# Patient Record
Sex: Female | Born: 1935
Health system: Southern US, Community
[De-identification: ages and names within clinical notes are randomized; demographics above are authoritative.]

## PROBLEM LIST (undated history)

## (undated) DIAGNOSIS — N6019 Diffuse cystic mastopathy of unspecified breast: Secondary | ICD-10-CM

## (undated) DIAGNOSIS — M797 Fibromyalgia: Secondary | ICD-10-CM

## (undated) DIAGNOSIS — G248 Other dystonia: Secondary | ICD-10-CM

## (undated) DIAGNOSIS — I10 Essential (primary) hypertension: Secondary | ICD-10-CM

## (undated) DIAGNOSIS — I4510 Unspecified right bundle-branch block: Secondary | ICD-10-CM

## (undated) DIAGNOSIS — D72819 Decreased white blood cell count, unspecified: Secondary | ICD-10-CM

## (undated) DIAGNOSIS — E538 Deficiency of other specified B group vitamins: Secondary | ICD-10-CM

## (undated) DIAGNOSIS — K219 Gastro-esophageal reflux disease without esophagitis: Secondary | ICD-10-CM

## (undated) DIAGNOSIS — G3185 Corticobasal degeneration: Principal | ICD-10-CM

## (undated) DIAGNOSIS — G3184 Mild cognitive impairment, so stated: Secondary | ICD-10-CM

## (undated) DIAGNOSIS — G5600 Carpal tunnel syndrome, unspecified upper limb: Secondary | ICD-10-CM

## (undated) HISTORY — DX: Essential (primary) hypertension: I10

## (undated) HISTORY — DX: Carpal tunnel syndrome, unspecified upper limb: G56.00

## (undated) HISTORY — PX: TONSILLECTOMY: SUR1361

## (undated) HISTORY — DX: Diffuse cystic mastopathy of unspecified breast: N60.19

## (undated) HISTORY — DX: Unspecified right bundle-branch block: I45.10

## (undated) HISTORY — DX: Decreased white blood cell count, unspecified: D72.819

## (undated) HISTORY — PX: APPENDECTOMY: SHX54

## (undated) HISTORY — DX: Deficiency of other specified B group vitamins: E53.8

## (undated) HISTORY — DX: Fibromyalgia: M79.7

## (undated) HISTORY — DX: Gastro-esophageal reflux disease without esophagitis: K21.9

## (undated) HISTORY — PX: CARDIAC CATHETERIZATION: SHX172

## (undated) HISTORY — DX: Mild cognitive impairment of uncertain or unknown etiology: G31.84

## (undated) HISTORY — DX: Corticobasal degeneration: G31.85

---

## 2000-08-10 ENCOUNTER — Other Ambulatory Visit: Admission: RE | Admit: 2000-08-10 | Discharge: 2000-08-10 | Payer: Self-pay | Admitting: Geriatric Medicine

## 2000-10-19 ENCOUNTER — Ambulatory Visit (HOSPITAL_COMMUNITY): Admission: RE | Admit: 2000-10-19 | Discharge: 2000-10-19 | Payer: Self-pay | Admitting: Geriatric Medicine

## 2000-12-16 ENCOUNTER — Encounter: Admission: RE | Admit: 2000-12-16 | Discharge: 2000-12-16 | Payer: Self-pay | Admitting: Obstetrics and Gynecology

## 2000-12-16 ENCOUNTER — Encounter: Payer: Self-pay | Admitting: Obstetrics and Gynecology

## 2001-01-24 ENCOUNTER — Encounter: Payer: Self-pay | Admitting: Geriatric Medicine

## 2001-01-24 ENCOUNTER — Encounter: Admission: RE | Admit: 2001-01-24 | Discharge: 2001-01-24 | Payer: Self-pay | Admitting: Geriatric Medicine

## 2001-09-15 ENCOUNTER — Other Ambulatory Visit: Admission: RE | Admit: 2001-09-15 | Discharge: 2001-09-15 | Payer: Self-pay | Admitting: Obstetrics and Gynecology

## 2001-09-30 ENCOUNTER — Emergency Department (HOSPITAL_COMMUNITY): Admission: EM | Admit: 2001-09-30 | Discharge: 2001-09-30 | Payer: Self-pay | Admitting: Emergency Medicine

## 2001-09-30 ENCOUNTER — Encounter: Payer: Self-pay | Admitting: Emergency Medicine

## 2001-10-02 ENCOUNTER — Encounter: Admission: RE | Admit: 2001-10-02 | Discharge: 2001-10-02 | Payer: Self-pay | Admitting: Geriatric Medicine

## 2001-10-02 ENCOUNTER — Encounter: Payer: Self-pay | Admitting: Geriatric Medicine

## 2001-10-05 ENCOUNTER — Encounter: Payer: Self-pay | Admitting: Geriatric Medicine

## 2001-10-05 ENCOUNTER — Encounter: Admission: RE | Admit: 2001-10-05 | Discharge: 2001-10-05 | Payer: Self-pay | Admitting: Geriatric Medicine

## 2001-12-18 ENCOUNTER — Encounter: Payer: Self-pay | Admitting: Obstetrics and Gynecology

## 2001-12-18 ENCOUNTER — Encounter: Admission: RE | Admit: 2001-12-18 | Discharge: 2001-12-18 | Payer: Self-pay | Admitting: Obstetrics and Gynecology

## 2002-10-10 ENCOUNTER — Encounter: Payer: Self-pay | Admitting: Obstetrics and Gynecology

## 2002-10-10 ENCOUNTER — Encounter: Admission: RE | Admit: 2002-10-10 | Discharge: 2002-10-10 | Payer: Self-pay | Admitting: Obstetrics and Gynecology

## 2002-10-18 ENCOUNTER — Other Ambulatory Visit: Admission: RE | Admit: 2002-10-18 | Discharge: 2002-10-18 | Payer: Self-pay | Admitting: Obstetrics and Gynecology

## 2003-10-16 ENCOUNTER — Encounter: Admission: RE | Admit: 2003-10-16 | Discharge: 2003-10-16 | Payer: Self-pay | Admitting: Obstetrics and Gynecology

## 2003-11-04 ENCOUNTER — Other Ambulatory Visit: Admission: RE | Admit: 2003-11-04 | Discharge: 2003-11-04 | Payer: Self-pay | Admitting: Physician Assistant

## 2004-10-23 ENCOUNTER — Encounter: Admission: RE | Admit: 2004-10-23 | Discharge: 2004-10-23 | Payer: Self-pay | Admitting: Obstetrics and Gynecology

## 2004-12-07 ENCOUNTER — Other Ambulatory Visit: Admission: RE | Admit: 2004-12-07 | Discharge: 2004-12-07 | Payer: Self-pay | Admitting: Obstetrics and Gynecology

## 2005-02-03 ENCOUNTER — Encounter: Admission: RE | Admit: 2005-02-03 | Discharge: 2005-02-03 | Payer: Self-pay | Admitting: Obstetrics and Gynecology

## 2005-02-05 ENCOUNTER — Encounter: Admission: RE | Admit: 2005-02-05 | Discharge: 2005-02-05 | Payer: Self-pay | Admitting: Obstetrics and Gynecology

## 2005-02-11 ENCOUNTER — Encounter: Admission: RE | Admit: 2005-02-11 | Discharge: 2005-02-11 | Payer: Self-pay | Admitting: Obstetrics and Gynecology

## 2005-02-18 ENCOUNTER — Encounter (INDEPENDENT_AMBULATORY_CARE_PROVIDER_SITE_OTHER): Payer: Self-pay | Admitting: Specialist

## 2005-02-18 ENCOUNTER — Encounter: Admission: RE | Admit: 2005-02-18 | Discharge: 2005-02-18 | Payer: Self-pay | Admitting: Obstetrics and Gynecology

## 2005-10-25 ENCOUNTER — Encounter: Admission: RE | Admit: 2005-10-25 | Discharge: 2005-10-25 | Payer: Self-pay | Admitting: Geriatric Medicine

## 2005-11-11 ENCOUNTER — Encounter: Admission: RE | Admit: 2005-11-11 | Discharge: 2005-11-11 | Payer: Self-pay | Admitting: Obstetrics and Gynecology

## 2005-12-14 ENCOUNTER — Other Ambulatory Visit: Admission: RE | Admit: 2005-12-14 | Discharge: 2005-12-14 | Payer: Self-pay | Admitting: Obstetrics and Gynecology

## 2006-01-03 ENCOUNTER — Ambulatory Visit: Payer: Self-pay | Admitting: Internal Medicine

## 2006-01-27 ENCOUNTER — Encounter (INDEPENDENT_AMBULATORY_CARE_PROVIDER_SITE_OTHER): Payer: Self-pay | Admitting: *Deleted

## 2006-01-27 ENCOUNTER — Ambulatory Visit: Payer: Self-pay | Admitting: Internal Medicine

## 2006-01-31 ENCOUNTER — Ambulatory Visit: Payer: Self-pay | Admitting: Internal Medicine

## 2006-03-10 ENCOUNTER — Ambulatory Visit: Payer: Self-pay | Admitting: Internal Medicine

## 2006-05-18 ENCOUNTER — Encounter: Admission: RE | Admit: 2006-05-18 | Discharge: 2006-05-18 | Payer: Self-pay | Admitting: Obstetrics and Gynecology

## 2006-12-15 ENCOUNTER — Other Ambulatory Visit: Admission: RE | Admit: 2006-12-15 | Discharge: 2006-12-15 | Payer: Self-pay | Admitting: Obstetrics and Gynecology

## 2006-12-29 ENCOUNTER — Encounter: Admission: RE | Admit: 2006-12-29 | Discharge: 2006-12-29 | Payer: Self-pay | Admitting: Obstetrics and Gynecology

## 2007-12-21 ENCOUNTER — Other Ambulatory Visit: Admission: RE | Admit: 2007-12-21 | Discharge: 2007-12-21 | Payer: Self-pay | Admitting: Obstetrics and Gynecology

## 2008-01-01 ENCOUNTER — Encounter: Admission: RE | Admit: 2008-01-01 | Discharge: 2008-01-01 | Payer: Self-pay | Admitting: Obstetrics and Gynecology

## 2008-05-27 ENCOUNTER — Encounter: Admission: RE | Admit: 2008-05-27 | Discharge: 2008-05-27 | Payer: Self-pay | Admitting: Orthopaedic Surgery

## 2009-01-06 ENCOUNTER — Encounter: Admission: RE | Admit: 2009-01-06 | Discharge: 2009-01-06 | Payer: Self-pay | Admitting: Obstetrics and Gynecology

## 2010-01-09 ENCOUNTER — Encounter: Admission: RE | Admit: 2010-01-09 | Discharge: 2010-01-09 | Payer: Self-pay | Admitting: Obstetrics and Gynecology

## 2010-07-19 ENCOUNTER — Encounter: Payer: Self-pay | Admitting: Obstetrics and Gynecology

## 2010-08-10 ENCOUNTER — Other Ambulatory Visit: Payer: Self-pay | Admitting: Dermatology

## 2010-11-13 NOTE — Assessment & Plan Note (Signed)
[facility]                           GASTROENTEROLOGY OFFICE NOTE   RIAH, KEHOE                        MRN:          [id number]  DATE:03/10/2006                            DOB:          1934/04/22    RECENT MEDICAL HISTORY AND PHYSICAL FORM:  In my chart for full details.   ASSESSMENT:  1. Gastroesophageal reflux disease, she had grade B esophagitis, off of      medication.  __________ polyps on EGD January 27, 2006.  Her symptoms are      nearly completely controlled except for rare exacerbations with spicy      foods.  On Zegrid 20 mg each day to every other day.  2. Irritable bowel syndrome with spells of diarrhea to certain foods, like      spicy foods, fatty foods with occasional Lomotil use, under good      control.  3. Abnormal liver lesion seen on breast MRI, 1.6 cm was stable over a six      month time.  She has a followup scheduled for November and knows to      contact the breast center about that or her gynecologist who ever is      arranging that, if she does not hear.   RECOMMENDATIONS/PLAN:  Continue current regimen for this patient.  She is  doing well.  I will see her back as needed.  I can refill her Zegrid or Dr.  Larina Bras can.  She is fortunate in that her insurance plan pays for all of  that.  She is taking it in the middle of the morning which seems to work  well and getting the sodium bicarbonate with immediate release of Omeprazole  and Zegrid, that is fine.  She is reassured at this point.  She does not  need a colonoscopy as she had a normal one in 2004.   I appreciate the opportunity to care for this patient.                                   Iva Boop, MD,FACG   CEG/MedQ  DD:  03/10/2006  DT:  03/10/2006  Job #:  [id number]   cc:   Hal T. Stoneking, M.D.  Artist Pais, M.D.

## 2010-12-07 ENCOUNTER — Other Ambulatory Visit: Payer: Self-pay | Admitting: Geriatric Medicine

## 2010-12-07 DIAGNOSIS — Z1231 Encounter for screening mammogram for malignant neoplasm of breast: Secondary | ICD-10-CM

## 2011-01-11 ENCOUNTER — Ambulatory Visit
Admission: RE | Admit: 2011-01-11 | Discharge: 2011-01-11 | Disposition: A | Discharge status: Home / Self Care | Payer: BC Managed Care – PPO | Source: Ambulatory Visit | Attending: Geriatric Medicine | Admitting: Geriatric Medicine

## 2011-01-11 DIAGNOSIS — Z1231 Encounter for screening mammogram for malignant neoplasm of breast: Secondary | ICD-10-CM

## 2011-03-10 ENCOUNTER — Other Ambulatory Visit: Payer: Self-pay | Admitting: Obstetrics and Gynecology

## 2011-03-10 ENCOUNTER — Other Ambulatory Visit (HOSPITAL_COMMUNITY)
Admission: RE | Admit: 2011-03-10 | Discharge: 2011-03-10 | Disposition: A | Discharge status: Home / Self Care | Payer: Medicare Other | Source: Ambulatory Visit | Attending: Obstetrics and Gynecology | Admitting: Obstetrics and Gynecology

## 2011-03-10 DIAGNOSIS — Z124 Encounter for screening for malignant neoplasm of cervix: Secondary | ICD-10-CM | POA: Insufficient documentation

## 2011-07-12 DIAGNOSIS — I10 Essential (primary) hypertension: Secondary | ICD-10-CM | POA: Diagnosis not present

## 2011-08-23 DIAGNOSIS — M9981 Other biomechanical lesions of cervical region: Secondary | ICD-10-CM | POA: Diagnosis not present

## 2011-08-23 DIAGNOSIS — M545 Low back pain: Secondary | ICD-10-CM | POA: Diagnosis not present

## 2011-08-23 DIAGNOSIS — M542 Cervicalgia: Secondary | ICD-10-CM | POA: Diagnosis not present

## 2011-08-23 DIAGNOSIS — M546 Pain in thoracic spine: Secondary | ICD-10-CM | POA: Diagnosis not present

## 2011-08-23 DIAGNOSIS — M999 Biomechanical lesion, unspecified: Secondary | ICD-10-CM | POA: Diagnosis not present

## 2011-09-01 DIAGNOSIS — Z9181 History of falling: Secondary | ICD-10-CM | POA: Diagnosis not present

## 2011-09-01 DIAGNOSIS — M6281 Muscle weakness (generalized): Secondary | ICD-10-CM | POA: Diagnosis not present

## 2011-09-02 DIAGNOSIS — Z9181 History of falling: Secondary | ICD-10-CM | POA: Diagnosis not present

## 2011-09-02 DIAGNOSIS — M6281 Muscle weakness (generalized): Secondary | ICD-10-CM | POA: Diagnosis not present

## 2011-09-06 DIAGNOSIS — Z9181 History of falling: Secondary | ICD-10-CM | POA: Diagnosis not present

## 2011-09-06 DIAGNOSIS — M6281 Muscle weakness (generalized): Secondary | ICD-10-CM | POA: Diagnosis not present

## 2011-09-08 DIAGNOSIS — Z9181 History of falling: Secondary | ICD-10-CM | POA: Diagnosis not present

## 2011-09-08 DIAGNOSIS — M6281 Muscle weakness (generalized): Secondary | ICD-10-CM | POA: Diagnosis not present

## 2011-09-10 DIAGNOSIS — M6281 Muscle weakness (generalized): Secondary | ICD-10-CM | POA: Diagnosis not present

## 2011-09-10 DIAGNOSIS — Z9181 History of falling: Secondary | ICD-10-CM | POA: Diagnosis not present

## 2011-09-15 DIAGNOSIS — M6281 Muscle weakness (generalized): Secondary | ICD-10-CM | POA: Diagnosis not present

## 2011-09-15 DIAGNOSIS — Z9181 History of falling: Secondary | ICD-10-CM | POA: Diagnosis not present

## 2011-09-17 DIAGNOSIS — Z9181 History of falling: Secondary | ICD-10-CM | POA: Diagnosis not present

## 2011-09-17 DIAGNOSIS — M6281 Muscle weakness (generalized): Secondary | ICD-10-CM | POA: Diagnosis not present

## 2011-09-20 DIAGNOSIS — M6281 Muscle weakness (generalized): Secondary | ICD-10-CM | POA: Diagnosis not present

## 2011-09-20 DIAGNOSIS — Z9181 History of falling: Secondary | ICD-10-CM | POA: Diagnosis not present

## 2011-09-22 DIAGNOSIS — M6281 Muscle weakness (generalized): Secondary | ICD-10-CM | POA: Diagnosis not present

## 2011-09-22 DIAGNOSIS — Z9181 History of falling: Secondary | ICD-10-CM | POA: Diagnosis not present

## 2011-09-24 DIAGNOSIS — M6281 Muscle weakness (generalized): Secondary | ICD-10-CM | POA: Diagnosis not present

## 2011-09-24 DIAGNOSIS — Z9181 History of falling: Secondary | ICD-10-CM | POA: Diagnosis not present

## 2011-09-29 DIAGNOSIS — R279 Unspecified lack of coordination: Secondary | ICD-10-CM | POA: Diagnosis not present

## 2011-09-29 DIAGNOSIS — R609 Edema, unspecified: Secondary | ICD-10-CM | POA: Diagnosis not present

## 2011-10-07 DIAGNOSIS — IMO0002 Reserved for concepts with insufficient information to code with codable children: Secondary | ICD-10-CM | POA: Diagnosis not present

## 2011-10-07 DIAGNOSIS — M999 Biomechanical lesion, unspecified: Secondary | ICD-10-CM | POA: Diagnosis not present

## 2011-10-20 DIAGNOSIS — M545 Low back pain: Secondary | ICD-10-CM | POA: Diagnosis not present

## 2011-10-20 DIAGNOSIS — M999 Biomechanical lesion, unspecified: Secondary | ICD-10-CM | POA: Diagnosis not present

## 2011-10-20 DIAGNOSIS — M9981 Other biomechanical lesions of cervical region: Secondary | ICD-10-CM | POA: Diagnosis not present

## 2011-12-02 DIAGNOSIS — E782 Mixed hyperlipidemia: Secondary | ICD-10-CM | POA: Diagnosis not present

## 2011-12-02 DIAGNOSIS — Z79899 Other long term (current) drug therapy: Secondary | ICD-10-CM | POA: Diagnosis not present

## 2011-12-02 DIAGNOSIS — G245 Blepharospasm: Secondary | ICD-10-CM | POA: Diagnosis not present

## 2011-12-02 DIAGNOSIS — E119 Type 2 diabetes mellitus without complications: Secondary | ICD-10-CM | POA: Diagnosis not present

## 2011-12-02 DIAGNOSIS — I10 Essential (primary) hypertension: Secondary | ICD-10-CM | POA: Diagnosis not present

## 2011-12-09 DIAGNOSIS — M545 Low back pain: Secondary | ICD-10-CM | POA: Diagnosis not present

## 2011-12-09 DIAGNOSIS — M999 Biomechanical lesion, unspecified: Secondary | ICD-10-CM | POA: Diagnosis not present

## 2011-12-09 DIAGNOSIS — M9981 Other biomechanical lesions of cervical region: Secondary | ICD-10-CM | POA: Diagnosis not present

## 2011-12-27 DIAGNOSIS — R0789 Other chest pain: Secondary | ICD-10-CM | POA: Diagnosis not present

## 2011-12-27 DIAGNOSIS — R609 Edema, unspecified: Secondary | ICD-10-CM | POA: Diagnosis not present

## 2011-12-27 DIAGNOSIS — I10 Essential (primary) hypertension: Secondary | ICD-10-CM | POA: Diagnosis not present

## 2011-12-27 DIAGNOSIS — I4510 Unspecified right bundle-branch block: Secondary | ICD-10-CM | POA: Diagnosis not present

## 2011-12-29 DIAGNOSIS — R609 Edema, unspecified: Secondary | ICD-10-CM | POA: Diagnosis not present

## 2011-12-29 DIAGNOSIS — R0789 Other chest pain: Secondary | ICD-10-CM | POA: Diagnosis not present

## 2011-12-29 DIAGNOSIS — M7989 Other specified soft tissue disorders: Secondary | ICD-10-CM | POA: Diagnosis not present

## 2011-12-31 ENCOUNTER — Other Ambulatory Visit: Payer: Self-pay | Admitting: Cardiology

## 2011-12-31 ENCOUNTER — Encounter: Payer: Self-pay | Admitting: Cardiology

## 2011-12-31 NOTE — Addendum Note (Signed)
Addended by: Armanda Magic on: 12/31/2011 10:46 AM   Modules accepted: Orders

## 2011-12-31 NOTE — H&P (Signed)
Office Visit     Patient: Tracy [name], Tracy [name] Provider: Armanda Magic, MD  DOB: [birth year] Age: 78 Y Sex: Female Date: 12/27/2011  Phone: [phone]   Address: [address] [place], [place], [place]  Pcp: HAL STONEKING       Subjective:     CC:    1. CHEST HEAVINESS X 1 WEEK/LIGHTHEADEDNESS.        HPI:  General:  The patient presents today for followup of more chest pain. She had some chest pain last fall and nuclear stress test was normal. She now presents back today with complaints of recurrent chest pain. She thinks some of it is muscular but then she has some problems of feeling like there is an elephant sitting on her chest. She has a history of GERD and is having a flare of the refulx. The chest pressure is all day off and on with no radiation. She has been taking TUMS for the GERD but she is not sure if it helps the pressure in her chest. She denies any SOB, DOE, palpitations or syncope. She has noticed some swelling in her left leg that is worse by the end of the day for the past 3 months and occurs daily. It is all across the ankle. .        ROS:  See HPI, A twelve system review was perfomed at today's visit. For pertinent positives and negatives see HPI.       Medical History: Diabetes mellitus type 2, Fibromyalgia, Fibrocystic breast disease with hyperproliferative ductal cells with focal atypia, Hypertension, GERD, Carpal tunnel syndrome, history abnormal Pap smear, opthalmology-Dr. Charlotte Sanes, cardiology-Dr. Mayford Knife, GI-Dr. Randa Evens, GYN-Dr. Gevena Cotton, orthopedics-Dr. Merlyn Lot, dentistry-Dr. Melvyn Neth, chiropractor-Dr. Melvyn Neth, Right bundle branch block, hx pneumonia and since then often easily gets URIs., Normal exercize cardiolite 03/2011.        Family History: Father: deceased 35 yrs MI Mother: deceased macular degeneration, dementia Maternal aunt: Breast cancer        Social History:  General:  History of smoking  cigarettes: Never smoked no Smoking.  no Alcohol.  no  Recreational drug use.  no Exercise.  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  Marital Status: married.  Children: Boys, 2.  Seat belt use: yes.  2 sons.       Medications: Vitamin D 1000 UNIT Tablet 1 tablet Once a day, Calcium 500-100-40 Tablet Chewable 1 tablet three times daily, PreserVision/Lutein Capsule 1 tablet daily, ZyrTEC 10 MG Tablet 1/2 tablet Once a day, Aspir-81 81 MG Tablet Delayed Release 1 tablet Once a day, Accu-Chek Aviva Test Strips - - as directed once a day, Krill Oil ? Capsule as directed , Hydrochlorothiazide 12.5 MG Capsule TAKE 1 CAPSULE EVERY DAY , Amlodipine Besylate 10 MG Tablet TAKE 1 TABLET EVERY DAY , GlipiZIDE 5 MG Tablet one half tablet twice a day, Metformin HCl 500 MG Tablet TAKE 1 TABLET IN THE MORNING THEN TAKE 1 TABLET AT NOON & TAKE 2 TABLETS IN THE EVENING , Medication List reviewed and reconciled with the patient       Allergies: Codeine (for allergy), Novocain, smoke, perfumes, molds, ACE inhibitor: cough.       Objective:     Vitals: Wt 163.2, Wt change 4.8 lb, Ht 64.5, BMI 27.58, Pulse sitting 84, BP sitting 140/68.       Examination:  Cardiology, General:  GENERAL APPEARANCE: pleasant, NAD.  HEENT: unremarkable.  CAROTID UPSTROKE: normal, no bruit.  JVD: flat.  HEART SOUNDS: regular, normal  S1, S2, no S3 or S4.  MURMUR: absent.  LUNGS: no rales or wheezes.  ABDOMEN: soft, non tender, positive bowel sounds, no masses felt.  EXTREMITIES: no leg edema.  PERIPHERAL PULSES: 2 plus bilateral.        Assessment:     Assessment:  1. Chest heaviness - 786.59 (Primary)  2. Essential hypertension, benign - 401.1, Blood pressure under fair control continue to monitor no change in medication  3. RBBB - 426.4  4. Edema - 782.3    Plan:     1. Chest heaviness  Diagnostic Imaging:EKG NSR, RBBB, Corson,Danielle 12/27/2011 01:36:12 PM > TURNER,TRACI M 12/27/2011 01:44:28 PM >, Cardiac Cath (Ordered  for 01/03/2012)  Since she is having recurrent chest discomfort despite a normal nuclear stress test several months ago I have recommended proceeding the heart catheterization to evaluate coronary arteries. , Risks and benefits of cardiac catheterization have been reviewed including risk of stroke, heart attack, death, bleeding, renal impariment and arterial damage. There was ample oppurtuny to answer questions. Alternatives were discussed. Patient understands and wishes to proceed.       2. Essential hypertension, benign Continue Hydrochlorothiazide Capsule, 12.5 MG, TAKE 1 CAPSULE EVERY DAY ; Continue Amlodipine Besylate Tablet, 10 MG, TAKE 1 TABLET EVERY DAY .       3. Edema  Diagnostic Imaging:Venous Doppler (Ordered for 01/03/2012)  She has isolated LLE edema at her ankle and a history of phlebitis after a car trip in the past so I will get a LE venous doppler to rule out DVT.        Immunizations:        Labs:        Procedure Codes: 19147 EKG I AND R       Preventive:         Follow Up: cardiac cath      Provider: Armanda Magic, MD  Patient: Tracy [name], Tracy [name] DOB: [birth year] Date: 12/27/2011

## 2012-01-04 ENCOUNTER — Encounter (HOSPITAL_BASED_OUTPATIENT_CLINIC_OR_DEPARTMENT_OTHER)
Admission: RE | Disposition: A | Discharge status: Home / Self Care | Payer: Self-pay | Source: Ambulatory Visit | Attending: Cardiology

## 2012-01-04 ENCOUNTER — Inpatient Hospital Stay (HOSPITAL_BASED_OUTPATIENT_CLINIC_OR_DEPARTMENT_OTHER)
Admission: RE | Admit: 2012-01-04 | Discharge: 2012-01-04 | Disposition: A | Discharge status: Home / Self Care | Payer: Medicare Other | Source: Ambulatory Visit | Attending: Cardiology | Admitting: Cardiology

## 2012-01-04 DIAGNOSIS — K219 Gastro-esophageal reflux disease without esophagitis: Secondary | ICD-10-CM | POA: Insufficient documentation

## 2012-01-04 DIAGNOSIS — E119 Type 2 diabetes mellitus without complications: Secondary | ICD-10-CM | POA: Insufficient documentation

## 2012-01-04 DIAGNOSIS — I4510 Unspecified right bundle-branch block: Secondary | ICD-10-CM | POA: Insufficient documentation

## 2012-01-04 DIAGNOSIS — R079 Chest pain, unspecified: Secondary | ICD-10-CM | POA: Diagnosis not present

## 2012-01-04 DIAGNOSIS — R0789 Other chest pain: Secondary | ICD-10-CM | POA: Diagnosis not present

## 2012-01-04 DIAGNOSIS — IMO0001 Reserved for inherently not codable concepts without codable children: Secondary | ICD-10-CM | POA: Insufficient documentation

## 2012-01-04 DIAGNOSIS — I10 Essential (primary) hypertension: Secondary | ICD-10-CM | POA: Diagnosis not present

## 2012-01-04 SURGERY — JV LEFT HEART CATHETERIZATION WITH CORONARY ANGIOGRAM
Anesthesia: Moderate Sedation

## 2012-01-04 MED ORDER — SODIUM CHLORIDE 0.9 % IV SOLN: 250.0000 mL | INTRAVENOUS | Status: DC | PRN

## 2012-01-04 MED ORDER — METFORMIN HCL 500 MG PO TABS: 500.0000 mg | ORAL_TABLET | Freq: Two times a day (BID) | ORAL | Status: DC

## 2012-01-04 MED ORDER — SODIUM CHLORIDE 0.9 % IJ SOLN: 3.0000 mL | INTRAMUSCULAR | Status: DC | PRN

## 2012-01-04 MED ORDER — ASPIRIN 81 MG PO CHEW
324.0000 mg | CHEWABLE_TABLET | ORAL | Status: AC
  Administered 2012-01-04: 324 mg via ORAL

## 2012-01-04 MED ORDER — SODIUM CHLORIDE 0.9 % IV SOLN
INTRAVENOUS | Status: DC
  Administered 2012-01-04: 10:00:00 via INTRAVENOUS

## 2012-01-04 MED ORDER — SODIUM CHLORIDE 0.9 % IV SOLN: 1.0000 mL/kg/h | INTRAVENOUS | Status: DC

## 2012-01-04 MED ORDER — SODIUM CHLORIDE 0.9 % IJ SOLN: 3.0000 mL | Freq: Two times a day (BID) | INTRAMUSCULAR | Status: DC

## 2012-01-04 MED ORDER — DIAZEPAM 5 MG PO TABS
5.0000 mg | ORAL_TABLET | ORAL | Status: AC
  Administered 2012-01-04: 5 mg via ORAL

## 2012-01-04 NOTE — CV Procedure (Signed)
PROCEDURE:  Left heart catheterization with selective coronary angiography, left ventriculogram.  INDICATIONS:    The risks, benefits, and details of the procedure were explained to the patient.  The patient verbalized understanding and wanted to proceed.  Informed written consent was obtained.  PROCEDURE TECHNIQUE:  After Xylocaine anesthesia a 29F sheath was placed in the right femoral artery with a single anterior needle wall stick.   Left coronary angiography was done using a Judkins L4 guide catheter.  Right coronary angiography was done using a Judkins R4 guide catheter.  Left ventriculography was done using a pigtail catheter.    CONTRAST:  Total of 75 cc.  COMPLICATIONS:  None.    HEMODYNAMICS:  Aortic pressure was 133/30mmHg; LV pressure was 134/12mmHg; LVEDP .  There was no gradient between the left ventricle and aorta.    ANGIOGRAPHIC DATA:   The left main coronary artery is widely patent.  The left anterior descending artery is widely patent and gives rise to a large bifurcating diagonal.  The superior and inferior branches of the diagonal then bifurcate again.  All are widely patent.   The left circumflex artery is widely patent.  It gives rise to 2 moderate sized OM branches which are patent.  The right coronary artery is widely patent.  Distally it bifurcates into a PDA and PL branches which are patent.  LEFT VENTRICULOGRAM:  Left ventricular angiogram was done in the 30 RAO projection and revealed normal left ventricular wall motion and systolic function with an estimated ejection fraction of 65%.  LVEDP was 12 mmHg.  IMPRESSIONS:  1. Normal left main coronary artery. 2. Normal left anterior descending artery and its branches. 3. Normal left circumflex artery and its branches. 4. Normal right coronary artery. 5. Normal left ventricular systolic function.  LVEDP 12 mmHg.  Ejection fraction 65%.  RECOMMENDATION:   1.  Discharge home when bedrest and IVF hydration  complete. 2.  Continue current meds 3.  Followup with my NP in 2 weeks for groin check 4.  Followup with primary MD for further workup of noncardiac chest pain

## 2012-01-04 NOTE — Interval H&P Note (Signed)
History and Physical Interval Note:  01/04/2012 11:04 AM  Tracy [name]  has presented today for surgery, with the diagnosis of cp  The various methods of treatment have been discussed with the patient and family. After consideration of risks, benefits and other options for treatment, the patient has consented to  Procedure(s) (LRB): JV LEFT HEART CATHETERIZATION WITH CORONARY ANGIOGRAM (N/A) as a surgical intervention .  The patient's history has been reviewed, patient examined, no change in status, stable for surgery.  I have reviewed the patients' chart and labs.  Questions were answered to the patient's satisfaction.     Loden Laurent R

## 2012-01-04 NOTE — Progress Notes (Signed)
Bedrest begins @ 1110.  Tegaderm dressing applied to right groin site by Venda Rodes.

## 2012-01-04 NOTE — H&P (View-Only) (Signed)
Office Visit     Patient: Edds, Aditi P Provider: Makeisha Jentsch, MD  DOB: 10/24/1933 Age: 78 Y Sex: Female Date: 12/27/2011  Phone: [phone]   Address: [address], [place], [id number]  Pcp: HAL STONEKING       Subjective:     CC:    1. CHEST HEAVINESS X 1 WEEK/LIGHTHEADEDNESS.        HPI:  General:  The patient presents today for followup of more chest pain. She had some chest pain last fall and nuclear stress test was normal. She now presents back today with complaints of recurrent chest pain. She thinks some of it is muscular but then she has some problems of feeling like there is an elephant sitting on her chest. She has a history of GERD and is having a flare of the refulx. The chest pressure is all day off and on with no radiation. She has been taking TUMS for the GERD but she is not sure if it helps the pressure in her chest. She denies any SOB, DOE, palpitations or syncope. She has noticed some swelling in her left leg that is worse by the end of the day for the past 3 months and occurs daily. It is all across the ankle. .        ROS:  See HPI, A twelve system review was perfomed at today's visit. For pertinent positives and negatives see HPI.       Medical History: Diabetes mellitus type 2, Fibromyalgia, Fibrocystic breast disease with hyperproliferative ductal cells with focal atypia, Hypertension, GERD, Carpal tunnel syndrome, history abnormal Pap smear, opthalmology-Dr. Mccuen, cardiology-Dr. Kaedin Hicklin, GI-Dr. Edwards, GYN-Dr. Okasi, orthopedics-Dr. Kuzma, dentistry-Dr. Lewis, chiropractor-Dr. Lewis, Right bundle branch block, hx pneumonia and since then often easily gets URIs., Normal exercize cardiolite 03/2011.        Family History: Father: deceased 51 yrs MI Mother: deceased macular degeneration, dementia Maternal aunt: Breast cancer        Social History:  General:  History of smoking  cigarettes: Never smoked no Smoking.  no Alcohol.  no  Recreational drug use.  no Exercise.  Occupation: unemployed, Retired librarian, Masters degree in library sciences.  Marital Status: married.  Children: Boys, 2.  Seat belt use: yes.  2 sons.       Medications: Vitamin D 1000 UNIT Tablet 1 tablet Once a day, Calcium 500-100-40 Tablet Chewable 1 tablet three times daily, PreserVision/Lutein Capsule 1 tablet daily, ZyrTEC 10 MG Tablet 1/2 tablet Once a day, Aspir-81 81 MG Tablet Delayed Release 1 tablet Once a day, Accu-Chek Aviva Test Strips - - as directed once a day, Krill Oil ? Capsule as directed , Hydrochlorothiazide 12.5 MG Capsule TAKE 1 CAPSULE EVERY DAY , Amlodipine Besylate 10 MG Tablet TAKE 1 TABLET EVERY DAY , GlipiZIDE 5 MG Tablet one half tablet twice a day, Metformin HCl 500 MG Tablet TAKE 1 TABLET IN THE MORNING THEN TAKE 1 TABLET AT NOON & TAKE 2 TABLETS IN THE EVENING , Medication List reviewed and reconciled with the patient       Allergies: Codeine (for allergy), Novocain, smoke, perfumes, molds, ACE inhibitor: cough.       Objective:     Vitals: Wt 163.2, Wt change 4.8 lb, Ht 64.5, BMI 27.58, Pulse sitting 84, BP sitting 140/68.       Examination:  Cardiology, General:  GENERAL APPEARANCE: pleasant, NAD.  HEENT: unremarkable.  CAROTID UPSTROKE: normal, no bruit.  JVD: flat.  HEART SOUNDS: regular, normal   S1, S2, no S3 or S4.  MURMUR: absent.  LUNGS: no rales or wheezes.  ABDOMEN: soft, non tender, positive bowel sounds, no masses felt.  EXTREMITIES: no leg edema.  PERIPHERAL PULSES: 2 plus bilateral.        Assessment:     Assessment:  1. Chest heaviness - 786.59 (Primary)  2. Essential hypertension, benign - 401.1, Blood pressure under fair control continue to monitor no change in medication  3. RBBB - 426.4  4. Edema - 782.3    Plan:     1. Chest heaviness  Diagnostic Imaging:EKG NSR, RBBB, Corson,Danielle 12/27/2011 01:36:12 PM > Israel Werts M 12/27/2011 01:44:28 PM >, Cardiac Cath (Ordered  for 01/03/2012)  Since she is having recurrent chest discomfort despite a normal nuclear stress test several months ago I have recommended proceeding the heart catheterization to evaluate coronary arteries. , Risks and benefits of cardiac catheterization have been reviewed including risk of stroke, heart attack, death, bleeding, renal impariment and arterial damage. There was ample oppurtuny to answer questions. Alternatives were discussed. Patient understands and wishes to proceed.       2. Essential hypertension, benign Continue Hydrochlorothiazide Capsule, 12.5 MG, TAKE 1 CAPSULE EVERY DAY ; Continue Amlodipine Besylate Tablet, 10 MG, TAKE 1 TABLET EVERY DAY .       3. Edema  Diagnostic Imaging:Venous Doppler (Ordered for 01/03/2012)  She has isolated LLE edema at her ankle and a history of phlebitis after a car trip in the past so I will get a LE venous doppler to rule out DVT.        Immunizations:        Labs:        Procedure Codes: 93000 EKG I AND R       Preventive:         Follow Up: cardiac cath      Provider: Sabena Winner, MD  Patient: Galeana, Amara P DOB: 06/01/1934 Date: 12/27/2011    

## 2012-01-14 ENCOUNTER — Other Ambulatory Visit: Payer: Self-pay | Admitting: Obstetrics and Gynecology

## 2012-01-14 DIAGNOSIS — Z1231 Encounter for screening mammogram for malignant neoplasm of breast: Secondary | ICD-10-CM

## 2012-01-18 DIAGNOSIS — R0789 Other chest pain: Secondary | ICD-10-CM | POA: Diagnosis not present

## 2012-01-18 DIAGNOSIS — Z48812 Encounter for surgical aftercare following surgery on the circulatory system: Secondary | ICD-10-CM | POA: Diagnosis not present

## 2012-01-18 DIAGNOSIS — I10 Essential (primary) hypertension: Secondary | ICD-10-CM | POA: Diagnosis not present

## 2012-02-07 ENCOUNTER — Ambulatory Visit
Admission: RE | Admit: 2012-02-07 | Discharge: 2012-02-07 | Disposition: A | Discharge status: Home / Self Care | Payer: Medicare Other | Source: Ambulatory Visit | Attending: Obstetrics and Gynecology | Admitting: Obstetrics and Gynecology

## 2012-02-07 DIAGNOSIS — Z1231 Encounter for screening mammogram for malignant neoplasm of breast: Secondary | ICD-10-CM

## 2012-03-01 DIAGNOSIS — Z23 Encounter for immunization: Secondary | ICD-10-CM | POA: Diagnosis not present

## 2012-03-13 DIAGNOSIS — E049 Nontoxic goiter, unspecified: Secondary | ICD-10-CM | POA: Diagnosis not present

## 2012-03-13 DIAGNOSIS — Z01419 Encounter for gynecological examination (general) (routine) without abnormal findings: Secondary | ICD-10-CM | POA: Diagnosis not present

## 2012-03-15 ENCOUNTER — Other Ambulatory Visit: Payer: Self-pay | Admitting: Obstetrics and Gynecology

## 2012-03-15 DIAGNOSIS — E049 Nontoxic goiter, unspecified: Secondary | ICD-10-CM

## 2012-03-17 ENCOUNTER — Ambulatory Visit
Admission: RE | Admit: 2012-03-17 | Discharge: 2012-03-17 | Disposition: A | Discharge status: Home / Self Care | Payer: Medicare Other | Source: Ambulatory Visit | Attending: Obstetrics and Gynecology | Admitting: Obstetrics and Gynecology

## 2012-03-17 DIAGNOSIS — E042 Nontoxic multinodular goiter: Secondary | ICD-10-CM | POA: Diagnosis not present

## 2012-03-17 DIAGNOSIS — E049 Nontoxic goiter, unspecified: Secondary | ICD-10-CM

## 2012-03-30 DIAGNOSIS — M545 Low back pain: Secondary | ICD-10-CM | POA: Diagnosis not present

## 2012-03-30 DIAGNOSIS — M999 Biomechanical lesion, unspecified: Secondary | ICD-10-CM | POA: Diagnosis not present

## 2012-03-30 DIAGNOSIS — M9981 Other biomechanical lesions of cervical region: Secondary | ICD-10-CM | POA: Diagnosis not present

## 2012-05-24 DIAGNOSIS — M6281 Muscle weakness (generalized): Secondary | ICD-10-CM | POA: Diagnosis not present

## 2012-05-24 DIAGNOSIS — R279 Unspecified lack of coordination: Secondary | ICD-10-CM | POA: Diagnosis not present

## 2012-05-24 DIAGNOSIS — M19049 Primary osteoarthritis, unspecified hand: Secondary | ICD-10-CM | POA: Diagnosis not present

## 2012-05-26 DIAGNOSIS — M19049 Primary osteoarthritis, unspecified hand: Secondary | ICD-10-CM | POA: Diagnosis not present

## 2012-05-26 DIAGNOSIS — R279 Unspecified lack of coordination: Secondary | ICD-10-CM | POA: Diagnosis not present

## 2012-05-26 DIAGNOSIS — M6281 Muscle weakness (generalized): Secondary | ICD-10-CM | POA: Diagnosis not present

## 2012-05-29 DIAGNOSIS — M19049 Primary osteoarthritis, unspecified hand: Secondary | ICD-10-CM | POA: Diagnosis not present

## 2012-05-29 DIAGNOSIS — M6281 Muscle weakness (generalized): Secondary | ICD-10-CM | POA: Diagnosis not present

## 2012-05-29 DIAGNOSIS — R279 Unspecified lack of coordination: Secondary | ICD-10-CM | POA: Diagnosis not present

## 2012-05-31 DIAGNOSIS — M6281 Muscle weakness (generalized): Secondary | ICD-10-CM | POA: Diagnosis not present

## 2012-05-31 DIAGNOSIS — R279 Unspecified lack of coordination: Secondary | ICD-10-CM | POA: Diagnosis not present

## 2012-05-31 DIAGNOSIS — M19049 Primary osteoarthritis, unspecified hand: Secondary | ICD-10-CM | POA: Diagnosis not present

## 2012-06-02 DIAGNOSIS — M19049 Primary osteoarthritis, unspecified hand: Secondary | ICD-10-CM | POA: Diagnosis not present

## 2012-06-02 DIAGNOSIS — M6281 Muscle weakness (generalized): Secondary | ICD-10-CM | POA: Diagnosis not present

## 2012-06-02 DIAGNOSIS — R279 Unspecified lack of coordination: Secondary | ICD-10-CM | POA: Diagnosis not present

## 2012-06-07 DIAGNOSIS — R279 Unspecified lack of coordination: Secondary | ICD-10-CM | POA: Diagnosis not present

## 2012-06-07 DIAGNOSIS — M6281 Muscle weakness (generalized): Secondary | ICD-10-CM | POA: Diagnosis not present

## 2012-06-07 DIAGNOSIS — M19049 Primary osteoarthritis, unspecified hand: Secondary | ICD-10-CM | POA: Diagnosis not present

## 2012-06-08 DIAGNOSIS — I10 Essential (primary) hypertension: Secondary | ICD-10-CM | POA: Diagnosis not present

## 2012-06-08 DIAGNOSIS — E782 Mixed hyperlipidemia: Secondary | ICD-10-CM | POA: Diagnosis not present

## 2012-06-08 DIAGNOSIS — E119 Type 2 diabetes mellitus without complications: Secondary | ICD-10-CM | POA: Diagnosis not present

## 2012-06-08 DIAGNOSIS — Z0000 Encounter for general adult medical examination without abnormal findings: Secondary | ICD-10-CM | POA: Diagnosis not present

## 2012-06-08 DIAGNOSIS — Z1331 Encounter for screening for depression: Secondary | ICD-10-CM | POA: Diagnosis not present

## 2012-06-08 DIAGNOSIS — Z79899 Other long term (current) drug therapy: Secondary | ICD-10-CM | POA: Diagnosis not present

## 2012-06-09 DIAGNOSIS — R279 Unspecified lack of coordination: Secondary | ICD-10-CM | POA: Diagnosis not present

## 2012-06-09 DIAGNOSIS — M6281 Muscle weakness (generalized): Secondary | ICD-10-CM | POA: Diagnosis not present

## 2012-06-09 DIAGNOSIS — M19049 Primary osteoarthritis, unspecified hand: Secondary | ICD-10-CM | POA: Diagnosis not present

## 2012-06-12 DIAGNOSIS — R279 Unspecified lack of coordination: Secondary | ICD-10-CM | POA: Diagnosis not present

## 2012-06-12 DIAGNOSIS — M6281 Muscle weakness (generalized): Secondary | ICD-10-CM | POA: Diagnosis not present

## 2012-06-12 DIAGNOSIS — M19049 Primary osteoarthritis, unspecified hand: Secondary | ICD-10-CM | POA: Diagnosis not present

## 2012-06-16 DIAGNOSIS — E119 Type 2 diabetes mellitus without complications: Secondary | ICD-10-CM | POA: Diagnosis not present

## 2012-06-16 DIAGNOSIS — H25019 Cortical age-related cataract, unspecified eye: Secondary | ICD-10-CM | POA: Diagnosis not present

## 2012-06-16 DIAGNOSIS — H52209 Unspecified astigmatism, unspecified eye: Secondary | ICD-10-CM | POA: Diagnosis not present

## 2012-07-24 DIAGNOSIS — I10 Essential (primary) hypertension: Secondary | ICD-10-CM | POA: Diagnosis not present

## 2012-07-24 DIAGNOSIS — R011 Cardiac murmur, unspecified: Secondary | ICD-10-CM | POA: Diagnosis not present

## 2012-07-24 DIAGNOSIS — G43909 Migraine, unspecified, not intractable, without status migrainosus: Secondary | ICD-10-CM | POA: Diagnosis not present

## 2012-07-31 DIAGNOSIS — G43909 Migraine, unspecified, not intractable, without status migrainosus: Secondary | ICD-10-CM | POA: Diagnosis not present

## 2012-07-31 DIAGNOSIS — R011 Cardiac murmur, unspecified: Secondary | ICD-10-CM | POA: Diagnosis not present

## 2012-07-31 DIAGNOSIS — I10 Essential (primary) hypertension: Secondary | ICD-10-CM | POA: Diagnosis not present

## 2012-08-02 DIAGNOSIS — M999 Biomechanical lesion, unspecified: Secondary | ICD-10-CM | POA: Diagnosis not present

## 2012-08-02 DIAGNOSIS — M9981 Other biomechanical lesions of cervical region: Secondary | ICD-10-CM | POA: Diagnosis not present

## 2012-08-02 DIAGNOSIS — M545 Low back pain: Secondary | ICD-10-CM | POA: Diagnosis not present

## 2012-09-06 DIAGNOSIS — G43109 Migraine with aura, not intractable, without status migrainosus: Secondary | ICD-10-CM | POA: Diagnosis not present

## 2012-09-06 DIAGNOSIS — R279 Unspecified lack of coordination: Secondary | ICD-10-CM | POA: Diagnosis not present

## 2012-09-06 DIAGNOSIS — R414 Neurologic neglect syndrome: Secondary | ICD-10-CM | POA: Diagnosis not present

## 2012-09-12 ENCOUNTER — Other Ambulatory Visit: Payer: Self-pay | Admitting: Neurology

## 2012-09-12 DIAGNOSIS — R279 Unspecified lack of coordination: Secondary | ICD-10-CM

## 2012-09-12 DIAGNOSIS — R414 Neurologic neglect syndrome: Secondary | ICD-10-CM

## 2012-09-19 ENCOUNTER — Other Ambulatory Visit: Payer: Medicare Other

## 2012-09-20 ENCOUNTER — Ambulatory Visit
Admission: RE | Admit: 2012-09-20 | Discharge: 2012-09-20 | Disposition: A | Discharge status: Home / Self Care | Payer: Medicare Other | Source: Ambulatory Visit | Attending: Neurology | Admitting: Neurology

## 2012-09-20 DIAGNOSIS — R279 Unspecified lack of coordination: Secondary | ICD-10-CM

## 2012-09-20 DIAGNOSIS — R414 Neurologic neglect syndrome: Secondary | ICD-10-CM | POA: Diagnosis not present

## 2012-09-20 MED ORDER — GADOBENATE DIMEGLUMINE 529 MG/ML IV SOLN
15.0000 mL | Freq: Once | INTRAVENOUS | Status: AC | PRN
  Administered 2012-09-20: 15 mL via INTRAVENOUS

## 2012-09-21 ENCOUNTER — Telehealth: Payer: Self-pay | Admitting: Neurology

## 2012-09-21 NOTE — Telephone Encounter (Signed)
Ammie: Please call pt, MRI showed age related changes, no acute lesion. Abnormal MRI brain (with and without) demonstrating: 1. Mild diffuse atrophy. 2. Mild chronic small vessel ischemic disease. 3. No acute findings.

## 2012-09-27 NOTE — Telephone Encounter (Signed)
Routed to Donna

## 2012-09-27 NOTE — Telephone Encounter (Signed)
Spoke to patient with MRI results, per Dr. Vickey Huger. She will address further questions on follow-up appt.

## 2012-10-24 DIAGNOSIS — M999 Biomechanical lesion, unspecified: Secondary | ICD-10-CM | POA: Diagnosis not present

## 2012-10-24 DIAGNOSIS — M9981 Other biomechanical lesions of cervical region: Secondary | ICD-10-CM | POA: Diagnosis not present

## 2012-10-24 DIAGNOSIS — M546 Pain in thoracic spine: Secondary | ICD-10-CM | POA: Diagnosis not present

## 2012-10-24 DIAGNOSIS — M545 Low back pain: Secondary | ICD-10-CM | POA: Diagnosis not present

## 2012-10-24 DIAGNOSIS — M542 Cervicalgia: Secondary | ICD-10-CM | POA: Diagnosis not present

## 2012-10-25 ENCOUNTER — Ambulatory Visit (INDEPENDENT_AMBULATORY_CARE_PROVIDER_SITE_OTHER): Payer: Medicare Other | Admitting: Neurology

## 2012-10-25 ENCOUNTER — Encounter: Payer: Self-pay | Admitting: Neurology

## 2012-10-25 VITALS — BP 138/74 | HR 84 | Temp 97.6°F | Ht <= 58 in | Wt 163.0 lb

## 2012-10-25 DIAGNOSIS — I6350 Cerebral infarction due to unspecified occlusion or stenosis of unspecified cerebral artery: Secondary | ICD-10-CM

## 2012-10-25 DIAGNOSIS — G467 Other lacunar syndromes: Secondary | ICD-10-CM | POA: Insufficient documentation

## 2012-10-25 DIAGNOSIS — R471 Dysarthria and anarthria: Secondary | ICD-10-CM

## 2012-10-25 MED ORDER — CARBIDOPA-LEVODOPA 10-100 MG PO TABS: 1.0000 | ORAL_TABLET | Freq: Two times a day (BID) | ORAL | Status: DC

## 2012-10-25 NOTE — Patient Instructions (Signed)
Carbidopa; Levodopa tablets What is this medicine? CARBIDOPA;LEVODOPA (kar bi DOE pa; lee voe DOE pa) is used to treat the symptoms of Parkinson's disease. This medicine may be used for other purposes; ask your health care provider or pharmacist if you have questions. What should I tell my health care provider before I take this medicine? They need to know if you have any of these conditions: -asthma or lung disease -depression or other mental illness -diabetes -glaucoma -heart disease, including history of a heart attack -irregular heart beat -kidney or liver disease -melanoma or suspicious skin lesions -stomach or intestine ulcers -an unusual or allergic reaction to levodopa, carbidopa, other medicines, foods, dyes, or preservatives -pregnant or trying to get pregnant -breast-feeding How should I use this medicine? Take this medicine by mouth with a glass of water. Follow the directions on the prescription label. Take your doses at regular intervals. Do not take your medicine more often than directed. Do not stop taking except on the advice of your doctor or health care professional. Talk to your pediatrician regarding the use of this medicine in children. Special care may be needed. Overdosage: If you think you have taken too much of this medicine contact a poison control center or emergency room at once. NOTE: This medicine is only for you. Do not share this medicine with others. What if I miss a dose? If you miss a dose, take it as soon as you can. If it is almost time for your next dose, take only that dose. Do not take double or extra doses. What may interact with this medicine? Do not take this medicine with any of the following medications: -isoniazid, INH -medicines called MAO Inhibitors like Nardil, Parnate, Marplan, Eldepryl -procarbazine -reserpine This medicine may also interact with the following medications: -droperidol -iron supplements -medicines for depression,  anxiety, or psychotic disturbances -medicines for high blood pressure -metoclopramide -papaverine -phenytoin This list may not describe all possible interactions. Give your health care provider a list of all the medicines, herbs, non-prescription drugs, or dietary supplements you use. Also tell them if you smoke, drink alcohol, or use illegal drugs. Some items may interact with your medicine. What should I watch for while using this medicine? Visit your doctor or health care professional for regular checks on your progress. It may be several weeks or months before you feel the full benefits of this medicine. Continue to take your medicine on a regular schedule. Do not take any additional medicines for Parkinson's disease without first consulting with your health care provider. You may experience a wearing of effect prior to the time for your next dose of this medicine. You may also experience an on-off effect where the medicine apparently stops working for anything from a minute to several hours, then suddenly starts working again. Tell your doctor or health care professional if any of these symptoms happen to you. Your dose may need to be changed. A high protein diet can slow or prevent absorption of this medicine. Avoid high protein foods near the time of taking this medicine to help to prevent these problems. Take this medicine at least 30 minutes before eating or one hour after meals. You may want to eat higher protein foods later in the day or in small amounts. Discuss your diet with your doctor or health care professional or nutritionist. You may get drowsy or dizzy. Do not drive, use machinery, or do anything that needs mental alertness until you know how this drug affects you. Do  not stand or sit up quickly, especially if you are an older patient. This reduces the risk of dizzy or fainting spells. Alcohol can make you more drowsy and dizzy. Avoid alcoholic drinks. If you find that you have sudden  feelings of wanting to sleep during normal activities, like cooking, watching television, or while driving or riding in a car, you should contact your health care professional. If you are diabetic, this medicine may interfere with the accuracy of some tests for sugar or ketones in the urine (does not interfere with blood tests). Check with your doctor or health care professional before changing the dose of your diabetic medicine. This medicine may discolor the urine or sweat, making it look darker or red in color. This is of no cause for concern. However, this may stain clothing or fabrics. There have been reports of increased sexual urges or other strong urges such as gambling while taking some medicines for Parkinson's disease. If you experience any of these urges while taking this medicine, you should report it to your health care provider as soon as possible. You should check your skin often for changes to moles and new growths while taking this medicine. Call your doctor if you notice any of these changes. What side effects may I notice from receiving this medicine? Side effects that you should report to your doctor or health care professional as soon as possible: -allergic reactions like skin rash, itching or hives, swelling of the face, lips, or tongue -anxiety, confusion, or nervousness -falling asleep during normal activities like driving -fast, irregular heartbeat -hallucination, loss of contact with reality -mood changes like aggressive behavior, depression -stomach pain -trouble passing urine -uncontrolled movements of the mouth, head, hands, feet, shoulders, eyelids or other unusual muscle movements Side effects that usually do not require medical attention (report to your doctor or health care professional if they continue or are bothersome): -headache -loss of appetite -muscle twitches -nausea, vomiting -nightmares, trouble sleeping -unusually weak or tired This list may not  describe all possible side effects. Call your doctor for medical advice about side effects. You may report side effects to FDA at 1-800-FDA-1088. Where should I keep my medicine? Keep out of the reach of children. Store at room temperature between 15 and 30 degrees C (59 and 86 degrees F). Protect from light. Throw away any unused medicine after the expiration date. NOTE: This sheet is a summary. It may not cover all possible information. If you have questions about this medicine, talk to your doctor, pharmacist, or health care provider.  2013, [employer]. (10/01/2008 9:26:29 PM)

## 2012-10-25 NOTE — Assessment & Plan Note (Signed)
Treatment trial with sinemet , 10 mg-100 mg.

## 2012-10-25 NOTE — Progress Notes (Signed)
[facility]  Provider:  Dr Mohit Zirbes Referring Provider:  Dr Carolanne Grumbling , Primary Care Physician:  Ginette Otto, MD  Chief Complaint  Patient presents with  . Follow-up    left hand problems,usage, rm 11    HPI:  Tracy [name] is a 79 y.o. female here as a referral from Dr. Mayford Knife and Dr. Pete Glatter.   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 French Ana, and by Dr. Merlene Laughter chief complaints on 09/06/2012 were a few was for flying motor activity in the left hand again the patient is right-handed but likes to play the [place]. As she put it in her on words the fingers do not listen when I cut the fingers to play a certain old work he did not move culture she has pointed to the index ring and middle fingers him she has also migraines without auras she has auras without migraines an experience she has last had been she took birth control pills well 3 decades ago. Dr. Maisie Fus suggested occupational therapy and she had seen PT and OT at the [facility] home. She did not see benefit from the occupational therapy at the time and the piano  playing remains affected.  she is not complaining of  headaches, no ataxia, nor pain , nor visual input impairment she's not nauseated.  She is the main caretaker of her 32 year old husband, a dialysis patient with a recent BKA>  Her husband recently had a right BKA amputation 3 weeks ago is no at the care center at the [facility] home. Reported feeling that her hand is disconnected from other fine motor functions is indeed visibly slower than I asked her to perform rapid alternating movements again in the patient's words "I have to think about the hand moving along before it does something but is not fast enough to play piano".  After the visit I had ordered an MRI of the brain, which showed no vascular impairment , bleed or tumour, and no significant atrophy.   The patient's past medical  history of bronchitis hypertension diabetes fibromyalgia she reports that she remains impaired rapid alternating movements and fine motor task involving her left, nondominant hand.   I had ordered I nerve conduction study with EMG after her last visit, a CBC without differential, C-reactive protein and sedimentation rate. All tests have returned normal the nerve conduction study EMG for the upper extremities have been performed by Dr. Denna Haggard in November to 2011 and short normal latencies and amplitudes based on this result I do not think we need to repeat the study but I would like to try a low-dose of Sinemet to see if the patient's fluidity of movements can be regained. She denies RLS, Tremor, has no cogwheeling.    Review of Systems: Out of a complete 14 system review, the patient complains of only the following symptoms, and all other reviewed systems are negative.  Endorsed vivid dreams, clumsy hand, decreased facial mimik.    History   Social History  . Marital Status: Married    Spouse Name: N/A    Number of Children: N/A  . Years of Education: N/A   Occupational History  . Not on file.   Social History Main Topics  . Smoking status: Never Smoker   . Smokeless tobacco: Not on file  . Alcohol Use: No  . Drug Use: No  . Sexually Active:    Other Topics Concern  .  Not on file   Social History Narrative  . No narrative on file    No family history on file.   Nation's father died of a heart attack at age 73 her mother after complications from a fall. A paternal grandfather had liver cancer maternal grandmother and grandfather had strokes her maternal grandfather also had a heart attack but one of the patient's 2 sons has ulcerative colitis.  Past Medical History  Diagnosis Date  . Diabetes mellitus   . Fibromyalgia   . Fibrocystic breast disease   . Hypertension   . GERD (gastroesophageal reflux disease)   . Carpal tunnel syndrome   . RBBB   . Mild cognitive  impairment     No past surgical history on file.  Current Outpatient Prescriptions  Medication Sig Dispense Refill  . amLODipine (NORVASC) 5 MG tablet Take 5 mg by mouth daily.      Marland Kitchen aspirin 81 MG tablet Take 81 mg by mouth daily.      . cetirizine (ZYRTEC) 10 MG tablet Take 5 mg by mouth daily.      . cholecalciferol (VITAMIN D) 1000 UNITS tablet Take 1,000 Units by mouth daily.      Marland Kitchen glipiZIDE (GLUCOTROL) 5 MG tablet Take 5 mg by mouth 2 (two) times daily before a meal. 1/2 tablet twice daily      . hydrochlorothiazide (HYDRODIURIL) 12.5 MG tablet Take 12.5 mg by mouth daily.      . metFORMIN (GLUCOPHAGE) 500 MG tablet Take 1 tablet (500 mg total) by mouth 2 (two) times daily with a meal. Do not restart Metformin until 7/12 am then continue prior home dosing of 1 tablet in am, 1 tablet at noon and 2 tablets in PM      . multivitamin-lutein (OCUVITE-LUTEIN) CAPS Take 1 capsule by mouth daily.       No current facility-administered medications for this visit.    Allergies as of 10/25/2012 - Review Complete 10/25/2012  Allergen Reaction Noted  . Ace inhibitors Cough 12/31/2011  . Codeine  12/31/2011  . Novocain (procaine hcl)  12/31/2011    Vitals: BP 138/74  Pulse 84  Temp(Src) 97.6 F (36.4 C)  Ht 6.5" (0.165 m)  Wt 163 lb (73.936 kg)  BMI 2,715.74 kg/m2 Last Weight:  Wt Readings from Last 1 Encounters:  10/25/12 163 lb (73.936 kg)   Last Height:   Ht Readings from Last 1 Encounters:  10/25/12 6.5" (0.165 m)   Vision Screening: vitals . Physical exam:  General: The patient is awake, alert and appears not in acute distress. The patient is well groomed. Head: Normocephalic, atraumatic. Neck is supple. Cardiovascular:  Regular rate and rhythm, , without  murmurs or carotid bruit, and without distended neck veins. Respiratory: Lungs are clear to auscultation. Skin:  Without evidence of edema, or rash Trunk:  patient  has normal posture.  Neurologic exam : The  patient is awake and alert, oriented to place and time.  Memory subjective described as intact. The patient is avoiding main traffic by time and location.   There is a normal attention span & concentration ability.  Speech is fluent without dysarthria,  Mild dysphonia . Mood and affect are appropriate.  Cranial nerves: Pupils are equal and briskly reactive to light.  Extraocular movements  in vertical and horizontal planes intact and without nystagmus. Visual fields by finger perimetry are intact. Hearing to finger rub intact.   Facial sensation intact to fine touch. Facial motor strength is symmetric  and tongue and uvula move midline.  Motor exam:   Normal tone and normal muscle bulk and symmetric normal strength in all extremities. Fine motor impairment with repetitive , fast movements is noted.   Sensory:  Fine touch, pinprick and vibration were tested in all extremities . Proprioception is tested in the upper extremities only. This was  normal.  Coordination: Rapid alternating movements in the fingers/hands is tested and normal. Finger-to-nose maneuver tested and normal without evidence of ataxia, dysmetria or tremor.  Gait and station: Patient walks without assistive device and is able and assisted stool climb up to the exam table. Strength within normal limits. Deep tendon reflexes: in the  upper and lower extremities are symmetric and intact.    Assessment:  After physical and neurologic examination, review of laboratory studies, imaging, neurophysiology testing and pre-existing records, assessment will be reviewed on the problem list.  Plan:  Treatment plan and additional workup will be reviewed under Problem List.

## 2012-12-07 DIAGNOSIS — I10 Essential (primary) hypertension: Secondary | ICD-10-CM | POA: Diagnosis not present

## 2012-12-07 DIAGNOSIS — E119 Type 2 diabetes mellitus without complications: Secondary | ICD-10-CM | POA: Diagnosis not present

## 2012-12-07 DIAGNOSIS — Z79899 Other long term (current) drug therapy: Secondary | ICD-10-CM | POA: Diagnosis not present

## 2012-12-07 DIAGNOSIS — E782 Mixed hyperlipidemia: Secondary | ICD-10-CM | POA: Diagnosis not present

## 2013-01-12 ENCOUNTER — Other Ambulatory Visit: Payer: Self-pay

## 2013-01-12 DIAGNOSIS — Z1231 Encounter for screening mammogram for malignant neoplasm of breast: Secondary | ICD-10-CM

## 2013-01-21 ENCOUNTER — Other Ambulatory Visit: Payer: Self-pay | Admitting: Neurology

## 2013-02-05 DIAGNOSIS — E782 Mixed hyperlipidemia: Secondary | ICD-10-CM | POA: Diagnosis not present

## 2013-02-05 DIAGNOSIS — I10 Essential (primary) hypertension: Secondary | ICD-10-CM | POA: Diagnosis not present

## 2013-02-05 DIAGNOSIS — I2510 Atherosclerotic heart disease of native coronary artery without angina pectoris: Secondary | ICD-10-CM | POA: Diagnosis not present

## 2013-02-13 DIAGNOSIS — N819 Female genital prolapse, unspecified: Secondary | ICD-10-CM | POA: Diagnosis not present

## 2013-02-13 DIAGNOSIS — R3915 Urgency of urination: Secondary | ICD-10-CM | POA: Diagnosis not present

## 2013-02-14 ENCOUNTER — Ambulatory Visit
Admission: RE | Admit: 2013-02-14 | Discharge: 2013-02-14 | Disposition: A | Discharge status: Home / Self Care | Payer: Medicare Other | Source: Ambulatory Visit

## 2013-02-14 DIAGNOSIS — Z1231 Encounter for screening mammogram for malignant neoplasm of breast: Secondary | ICD-10-CM

## 2013-02-28 ENCOUNTER — Ambulatory Visit (INDEPENDENT_AMBULATORY_CARE_PROVIDER_SITE_OTHER): Payer: Medicare Other | Admitting: Neurology

## 2013-02-28 ENCOUNTER — Encounter: Payer: Self-pay | Admitting: Neurology

## 2013-02-28 VITALS — BP 140/80 | HR 78 | Resp 18 | Ht 64.5 in | Wt 158.0 lb

## 2013-02-28 DIAGNOSIS — I6350 Cerebral infarction due to unspecified occlusion or stenosis of unspecified cerebral artery: Secondary | ICD-10-CM

## 2013-02-28 DIAGNOSIS — G2589 Other specified extrapyramidal and movement disorders: Secondary | ICD-10-CM

## 2013-02-28 DIAGNOSIS — R471 Dysarthria and anarthria: Secondary | ICD-10-CM

## 2013-02-28 DIAGNOSIS — G248 Other dystonia: Secondary | ICD-10-CM

## 2013-02-28 DIAGNOSIS — G467 Other lacunar syndromes: Secondary | ICD-10-CM

## 2013-02-28 MED ORDER — CARBIDOPA-LEVODOPA 10-100 MG PO TABS: 2.0000 | ORAL_TABLET | Freq: Two times a day (BID) | ORAL | Status: DC

## 2013-02-28 NOTE — Patient Instructions (Signed)
Dystonias The dystonias are movement disorders in which sustained muscle contractions cause twisting and repetitive movements or abnormal postures. The movements, which are involuntary and sometimes painful, may affect a single muscle; a group of muscles such as those in the arms, legs, or neck; or the entire body. Early symptoms (problems) may include a deterioration in handwriting after writing several lines, foot cramps, and a tendency of one foot to pull up or drag after running or walking some distance. Other possible symptoms are tremor and voice or speech difficulties. Birth injury (particularly due to lack of oxygen), certain infections, reactions to certain drugs, heavy-metal or carbon monoxide poisoning, trauma (damage caused by an accident), or stroke can cause dystonic symptoms. About half the cases of dystonia have no connection to disease or injury and are called primary or idiopathic dystonia. Of the primary dystonias, many cases appear to be inherited in a dominant manner. Dystonias can also be symptoms of other diseases, some of which may be hereditary (passed down from parents). In some individuals, symptoms of a dystonia appear spontaneously in childhood between the ages of 5 and 16, usually in the foot or in the hand. For other individuals, the symptoms emerge in late adolescence or early adulthood. TREATMENT  No one treatment has been found universally effective for dystonia. Instead, physicians use a variety of therapies (medications, surgery and other treatments such as physical therapy, splinting, stress management, and biofeedback), aimed at reducing or eliminating muscle spasms and pain. Since response to drugs varies among patients and even in the same person over time, the therapy must be individualized. PROGNOSIS The initial symptoms can be very mild and may be noticeable only after prolonged exertion, stress, or fatigue. Over a period of time, the symptoms may become more  noticeable and widespread and be unrelenting; sometimes, however, there is little or no progression. RESEARCH BEING DONE Investigators believe that the dystonias result from an abnormality in an area of the brain called the basal ganglia, where some of the messages that initiate muscle contractions are processed. Scientists suspect a defect in the body's ability to process a group of chemicals called neurotransmitters that help cells in the brain communicate with each other. Scientists at the [facility] laboratories have conducted detailed investigations of the pattern of muscle activity in persons with dystonias. Studies using EEG analysis and neuroimaging are probing brain activity. The search for the gene or genes responsible for some forms of dominantly inherited dystonias continues. In 1989, a team of researchers mapped a gene for early-onset torsion dystonia to chromosome 9; the gene was subsequently named DYT1. In 1997, the team sequenced the DYT1 gene and found that it codes for a previously unknown protein now called "torsin A." Document Released: 06/04/2002 Document Revised: 09/06/2011 Document Reviewed: 06/14/2005 [employer] Patient Information 2014 [employer].  

## 2013-02-28 NOTE — Progress Notes (Signed)
[facility]  Provider:  Melvyn [name], M D  Referring Provider: Ginette [name], * Primary Care Physician:  Tracy Otto, MD  Chief Complaint  Patient presents with  . Follow-up    4 mo. left hand,rm 10    HPI:  Tracy [name] is a 79 y.o. female  is seen here as a referral/ revisit  from Tracy. Pete [name] for and clumsiness. Tracy [name] had described hole she as of right dominant individual had developed a rather recent change in her ability to play by handedly pian or. She also states is that the index finger and middle finger seemed not to move ast quickly or fluently as a used to play. She went for  some occupational therapy, but the OT was reluctant to see her without a firm diagnosis ,  and so also physical therapy at the [place] home.  She  had therefor no completed  therapy and remained.  The patient has no associated symptoms of headaches ataxia, dizziness,  Fasciculations , numbness or pain,but some  visual focus problems,   She is not nauseated , not febrile, she has not is nor rash swelling or pain. No tremor and no dysphonia.    She's also no longer the  main caretaker of her husband, who passed away on May16th. Nov 15, 2012 , due to complications from an amputation after developing a gangraene on the foot. He was a dialysis patient.   I suspected that the patient may have a focal dystonia . Task related dystonia such as writers cramp ?Marland Kitchen   She noticed symptoms first when she couldn't cut her meat with the left hand, couldn't pick up the fork with the left hand. During piano playing the middle and ring  finger may touch a key that she had not intended to play. She feels as of the thumb is not moving as she likes to, the sometimes had to take the thumb with her healthy hand   .  Her 11/16/2011  Brain MRI was negative for stroke.  NCS and EMG ( Tracy [name] Nov 16, 2011)  were normal, labs here  were normal.  I tried Sinemet at low dose and the patient had no reported benefit. I  would like for her to be seen by my movement disorder specialist colleage Tracy [name].    Review of Systems: Out of a complete 14 system review, the patient complains of only the following symptoms, and all other reviewed systems are negative. Involuntary finger movements.   History   Social History  . Marital Status: Married    Spouse Name: N/A    Number of Children: 2  . Years of Education: masters   Occupational History  . retired    Social History Main Topics  . Smoking status: Never Smoker   . Smokeless tobacco: Not on file  . Alcohol Use: No  . Drug Use: No  . Sexual Activity: Not on file   Other Topics Concern  . Not on file   Social History Narrative  . No narrative on file    History reviewed. No pertinent family history.  Past Medical History  Diagnosis Date  . Diabetes mellitus   . Fibromyalgia   . Fibrocystic breast disease   . Hypertension   . GERD (gastroesophageal reflux disease)   . Carpal tunnel syndrome   . RBBB   . Mild cognitive impairment     History reviewed. No pertinent past surgical history.  Current Outpatient Prescriptions  Medication Sig  Dispense Refill  . amLODipine (NORVASC) 5 MG tablet Take 5 mg by mouth daily.      Marland Kitchen aspirin 81 MG tablet Take 81 mg by mouth daily.      . carbidopa-levodopa (SINEMET IR) 10-100 MG per tablet TAKE 1 TABLET BY MOUTH 2 (TWO) TIMES DAILY AT 10 AM AND 4 PM.  60 tablet  6  . cetirizine (ZYRTEC) 10 MG tablet Take 5 mg by mouth daily.      . cholecalciferol (VITAMIN D) 1000 UNITS tablet Take 1,000 Units by mouth daily.      Marland Kitchen glipiZIDE (GLUCOTROL) 5 MG tablet Take 5 mg by mouth 2 (two) times daily before a meal. 1/2 tablet twice daily      . hydrochlorothiazide (HYDRODIURIL) 12.5 MG tablet Take 12.5 mg by mouth daily.      . metFORMIN (GLUCOPHAGE) 500 MG tablet Take 1 tablet (500 mg total) by mouth 2 (two) times daily with a meal. Do not restart Metformin until 7/12 am then continue prior home dosing  of 1 tablet in am, 1 tablet at noon and 2 tablets in PM      . multivitamin-lutein (OCUVITE-LUTEIN) CAPS Take 1 capsule by mouth daily.      Marland Kitchen ACCU-CHEK AVIVA PLUS test strip        No current facility-administered medications for this visit.    Allergies as of 02/28/2013 - Review Complete 02/28/2013  Allergen Reaction Noted  . Ace inhibitors Cough 12/31/2011  . Codeine  12/31/2011  . Novocain [procaine hcl]  12/31/2011    Vitals: BP 140/80  Pulse 78  Resp 18  Ht 5' 4.5" (1.638 m)  Wt 158 lb (71.668 kg)  BMI 26.71 kg/m2 Last Weight:  Wt Readings from Last 1 Encounters:  02/28/13 158 lb (71.668 kg)   Last Height:   Ht Readings from Last 1 Encounters:  02/28/13 5' 4.5" (1.638 m)   General: The patient is awake, alert and appears not in acute distress. The patient is well groomed.  Head: Normocephalic, atraumatic. Neck is supple.  Cardiovascular: Regular rate and rhythm, , without murmurs or carotid bruit, and without distended neck veins.  Respiratory: Lungs are clear to auscultation.  Skin: Without evidence of edema, or rash  Trunk: patient has normal posture.  Neurologic exam :  The patient is awake and alert, oriented to place and time. Memory subjective described as intact. The patient is avoiding main traffic by time and location.  There is a normal attention span & concentration ability.  Speech is fluent without dysarthria, Mild dysphonia . Mood and affect are appropriate.  Cranial nerves:  Pupils are equal and briskly reactive to light. Extraocular movements in vertical and horizontal planes intact and without nystagmus. Visual fields by finger perimetry are intact.  Hearing to finger rub intact.  Facial sensation intact to fine touch. Facial motor strength is symmetric , face is slightly masked, and tongue and uvula move midline.  Motor exam: Normal tone and normal muscle bulk . She has no cog wheeling .She  reports slower responses to repeated finger and hand  activities in the left - Fine motor impairment with repetitive , fast movements is noted.  Sensory: Fine touch, pinprick and vibration were tested in all extremities - intact . Marland Kitchen Proprioception is tested in the upper extremities only. This was normal.  Coordination: . Finger-to-nose maneuver tested and normal without evidence of ataxia, dysmetria or tremor.  Tracy Hosie Poisson evaluated her ability to keep a pen in  the  left hand between middle and ring finger at one point above a paper. The ring finger went up and the middle finger down -  Gait and station: Patient walks without assistive device and is able to rise from the chair. Gait  strength within normal limits.  Deep tendon reflexes: in the upper and lower extremities are symmetric and intact. There is no clonus.    Assessment :  Focal dystonia in left hand and tone elevation in the whole arm. Botox may be an option to release the tone.  D/c sinemet . Referral to Tracy. Hosie Poisson for BOTOX.

## 2013-03-05 ENCOUNTER — Telehealth: Payer: Self-pay | Admitting: Neurology

## 2013-03-06 NOTE — Telephone Encounter (Signed)
Patient will reduce sinemet to 1 tab q 8 hours., from 2 twice a day, which caused symptoms. The patient noted a benefit , her piano play has improved n but side effects are not tolerated.

## 2013-03-06 NOTE — Telephone Encounter (Signed)
Patient says she cannot tolerate Carbidopa Levodopa since increase of dosage. Symptoms include: N&V, diarrhea, and head pain. She says she tried taking med w/ meals per My Chart instructions, but is still experiencing severe Nausea and "just doesn't feel goo". Requesting advice.

## 2013-03-13 ENCOUNTER — Encounter: Payer: Self-pay | Admitting: Neurology

## 2013-03-14 NOTE — Telephone Encounter (Signed)
Ofv note 02-28-13 states d/c sinemet. Renewed prescription. She continues with nausea even though decreased to one tablet every 8 hours. Please advise thanks.

## 2013-03-16 DIAGNOSIS — R279 Unspecified lack of coordination: Secondary | ICD-10-CM | POA: Diagnosis not present

## 2013-03-16 DIAGNOSIS — Z5189 Encounter for other specified aftercare: Secondary | ICD-10-CM | POA: Diagnosis not present

## 2013-03-20 DIAGNOSIS — Z5189 Encounter for other specified aftercare: Secondary | ICD-10-CM | POA: Diagnosis not present

## 2013-03-20 DIAGNOSIS — R279 Unspecified lack of coordination: Secondary | ICD-10-CM | POA: Diagnosis not present

## 2013-03-21 DIAGNOSIS — Z5189 Encounter for other specified aftercare: Secondary | ICD-10-CM | POA: Diagnosis not present

## 2013-03-21 DIAGNOSIS — R279 Unspecified lack of coordination: Secondary | ICD-10-CM | POA: Diagnosis not present

## 2013-03-23 DIAGNOSIS — Z5189 Encounter for other specified aftercare: Secondary | ICD-10-CM | POA: Diagnosis not present

## 2013-03-23 DIAGNOSIS — R279 Unspecified lack of coordination: Secondary | ICD-10-CM | POA: Diagnosis not present

## 2013-03-26 ENCOUNTER — Encounter: Payer: Self-pay | Admitting: Neurology

## 2013-03-26 DIAGNOSIS — Z5189 Encounter for other specified aftercare: Secondary | ICD-10-CM | POA: Diagnosis not present

## 2013-03-26 DIAGNOSIS — R279 Unspecified lack of coordination: Secondary | ICD-10-CM | POA: Diagnosis not present

## 2013-03-28 DIAGNOSIS — R279 Unspecified lack of coordination: Secondary | ICD-10-CM | POA: Diagnosis not present

## 2013-03-28 DIAGNOSIS — Z5189 Encounter for other specified aftercare: Secondary | ICD-10-CM | POA: Diagnosis not present

## 2013-03-30 DIAGNOSIS — Z5189 Encounter for other specified aftercare: Secondary | ICD-10-CM | POA: Diagnosis not present

## 2013-03-30 DIAGNOSIS — R279 Unspecified lack of coordination: Secondary | ICD-10-CM | POA: Diagnosis not present

## 2013-04-02 ENCOUNTER — Ambulatory Visit (INDEPENDENT_AMBULATORY_CARE_PROVIDER_SITE_OTHER): Payer: Medicare Other | Admitting: Neurology

## 2013-04-02 ENCOUNTER — Encounter: Payer: Self-pay | Admitting: Neurology

## 2013-04-02 VITALS — BP 120/71 | HR 87 | Ht 64.0 in | Wt 160.0 lb

## 2013-04-02 DIAGNOSIS — R279 Unspecified lack of coordination: Secondary | ICD-10-CM | POA: Diagnosis not present

## 2013-04-02 DIAGNOSIS — G248 Other dystonia: Secondary | ICD-10-CM

## 2013-04-02 DIAGNOSIS — G2589 Other specified extrapyramidal and movement disorders: Secondary | ICD-10-CM

## 2013-04-02 DIAGNOSIS — Z5189 Encounter for other specified aftercare: Secondary | ICD-10-CM | POA: Diagnosis not present

## 2013-04-02 MED ORDER — INCOBOTULINUMTOXINA 50 UNITS IM SOLR: 50.0000 [IU] | Freq: Once | INTRAMUSCULAR | Status: DC

## 2013-04-02 NOTE — Patient Instructions (Addendum)
Overall you are doing fairly well but I do want to suggest a few things today:   You received Xeomin injections today for focal hand dystonia. If you have any irritation at the injection site please use an ice pack or take some ibuprofen.   We will contact you in around 2 to 3 months to schedule your repeat injections. Please call us with any interim questions, concerns, problems, updates or refill requests.   My clinical assistant and will answer any of your questions and relay your messages to me and also relay most of my messages to you.   Our phone number is [phone]. We also have an after hours call service for urgent matters and there is a physician on-call for urgent questions. For any emergencies you know to call 911 or go to the nearest emergency room

## 2013-04-02 NOTE — Progress Notes (Signed)
Provider:  Dr Hosie Poisson Referring Provider: Ginette Otto, * Primary Care Physician:  Ginette Otto, MD  CC:  Focal hand dystonia  HPI:  Tracy [name] is a 79 y.o. female here for initial injections for focal hand dystonia.   Difficulty using L hand, most notable when trying to play the piano. Notes contract/flexion of her fingers, most notable with the 3rd/4th digits. Thumb also flexes in.   Contraindications and precautions discussed with patient. EMG guidance was used to inject muscles detailed below. Aseptic procedure was observed and patient tolerated procedure. Procedure performed by Dr. Elspeth Cho.  The condition has existed for more than 6 months, and pt does not have a diagnosis of ALS, Myasthenia Gravis or Lambert-Eaton Syndrome.  Risks and benefits of injections discussed and pt agrees to proceed with the procedure.  Written consent obtained These injections are medically necessary. He receives good benefits from these injections. These injections do not cause sedations or hallucinations which the oral therapies may cause.  Indication/Diagnosis: focal hand dystonia  Type of toxin:Xeomin  Lot # [id number]  Expiration date:08/2013  2:1 dilution  Injection sites: L FDS: 12.5 L FDP: 7.5     History   Social History  . Marital Status: Married    Spouse Name: N/A    Number of Children: 2  . Years of Education: masters   Occupational History  . retired    Social History Main Topics  . Smoking status: Never Smoker   . Smokeless tobacco: Never Used  . Alcohol Use: No  . Drug Use: No  . Sexual Activity: Not on file   Other Topics Concern  . Not on file   Social History Narrative  . No narrative on file    History reviewed. No pertinent family history.  Past Medical History  Diagnosis Date  . Diabetes mellitus   . Fibromyalgia   . Fibrocystic breast disease   . Hypertension   . GERD (gastroesophageal reflux disease)   . Carpal tunnel syndrome    . RBBB   . Mild cognitive impairment     History reviewed. No pertinent past surgical history.  Current Outpatient Prescriptions  Medication Sig Dispense Refill  . ACCU-CHEK AVIVA PLUS test strip       . amLODipine (NORVASC) 5 MG tablet Take 5 mg by mouth daily.      Marland Kitchen aspirin 81 MG tablet Take 81 mg by mouth daily.      . cetirizine (ZYRTEC) 10 MG tablet Take 5 mg by mouth daily.      . cholecalciferol (VITAMIN D) 1000 UNITS tablet Take 1,000 Units by mouth daily.      Marland Kitchen glipiZIDE (GLUCOTROL) 5 MG tablet Take 5 mg by mouth 2 (two) times daily before a meal. 1/2 tablet twice daily      . hydrochlorothiazide (HYDRODIURIL) 12.5 MG tablet Take 12.5 mg by mouth daily.      . metFORMIN (GLUCOPHAGE) 500 MG tablet Take 1 tablet (500 mg total) by mouth 2 (two) times daily with a meal. Do not restart Metformin until 7/12 am then continue prior home dosing of 1 tablet in am, 1 tablet at noon and 2 tablets in PM      . multivitamin-lutein (OCUVITE-LUTEIN) CAPS Take 1 capsule by mouth daily.       No current facility-administered medications for this visit.    Allergies as of 04/02/2013 - Review Complete 04/02/2013  Allergen Reaction Noted  . Ace inhibitors Cough 12/31/2011  . Codeine  12/31/2011  . Novocain [procaine hcl]  12/31/2011    Vitals: BP 120/71  Pulse 87  Ht 5\' 4"  (1.626 m)  Wt 160 lb (72.576 kg)  BMI 27.45 kg/m2 Last Weight:  Wt Readings from Last 1 Encounters:  04/02/13 160 lb (72.576 kg)   Last Height:   Ht Readings from Last 1 Encounters:  04/02/13 5\' 4"  (1.626 m)      Assessment:  After physical and neurologic examination, review of laboratory studies, imaging, neurophysiology testing and pre-existing records, assessment will be reviewed on the problem list.  Plan:  Treatment plan and additional workup will be reviewed under Problem List.  Tracy [name] is a 79 y.o. female here for botulinum toxin injections for focal hand dystonia. She tolerated procedure well  with no complications.   1) Botox injections as detailed above. A total of 20 units was used. 30 units wasted 2) Tylenol or Motrin for injection site pain. 3) Medication guide dispensed. 4) Follow up for repeat injections in 3 months

## 2013-04-04 DIAGNOSIS — Z5189 Encounter for other specified aftercare: Secondary | ICD-10-CM | POA: Diagnosis not present

## 2013-04-04 DIAGNOSIS — R279 Unspecified lack of coordination: Secondary | ICD-10-CM | POA: Diagnosis not present

## 2013-04-06 DIAGNOSIS — R279 Unspecified lack of coordination: Secondary | ICD-10-CM | POA: Diagnosis not present

## 2013-04-06 DIAGNOSIS — Z5189 Encounter for other specified aftercare: Secondary | ICD-10-CM | POA: Diagnosis not present

## 2013-04-09 DIAGNOSIS — R279 Unspecified lack of coordination: Secondary | ICD-10-CM | POA: Diagnosis not present

## 2013-04-09 DIAGNOSIS — Z5189 Encounter for other specified aftercare: Secondary | ICD-10-CM | POA: Diagnosis not present

## 2013-04-11 DIAGNOSIS — R279 Unspecified lack of coordination: Secondary | ICD-10-CM | POA: Diagnosis not present

## 2013-04-11 DIAGNOSIS — Z5189 Encounter for other specified aftercare: Secondary | ICD-10-CM | POA: Diagnosis not present

## 2013-04-13 DIAGNOSIS — R279 Unspecified lack of coordination: Secondary | ICD-10-CM | POA: Diagnosis not present

## 2013-04-13 DIAGNOSIS — Z5189 Encounter for other specified aftercare: Secondary | ICD-10-CM | POA: Diagnosis not present

## 2013-04-16 DIAGNOSIS — Z5189 Encounter for other specified aftercare: Secondary | ICD-10-CM | POA: Diagnosis not present

## 2013-04-16 DIAGNOSIS — R279 Unspecified lack of coordination: Secondary | ICD-10-CM | POA: Diagnosis not present

## 2013-04-18 DIAGNOSIS — Z5189 Encounter for other specified aftercare: Secondary | ICD-10-CM | POA: Diagnosis not present

## 2013-04-18 DIAGNOSIS — R279 Unspecified lack of coordination: Secondary | ICD-10-CM | POA: Diagnosis not present

## 2013-04-20 DIAGNOSIS — R279 Unspecified lack of coordination: Secondary | ICD-10-CM | POA: Diagnosis not present

## 2013-04-20 DIAGNOSIS — Z5189 Encounter for other specified aftercare: Secondary | ICD-10-CM | POA: Diagnosis not present

## 2013-05-03 ENCOUNTER — Other Ambulatory Visit: Payer: Self-pay

## 2013-05-09 DIAGNOSIS — Z01419 Encounter for gynecological examination (general) (routine) without abnormal findings: Secondary | ICD-10-CM | POA: Diagnosis not present

## 2013-05-09 DIAGNOSIS — N8111 Cystocele, midline: Secondary | ICD-10-CM | POA: Diagnosis not present

## 2013-05-19 ENCOUNTER — Encounter: Payer: Self-pay | Admitting: Neurology

## 2013-06-14 ENCOUNTER — Other Ambulatory Visit: Payer: Self-pay | Admitting: Geriatric Medicine

## 2013-06-14 DIAGNOSIS — E782 Mixed hyperlipidemia: Secondary | ICD-10-CM | POA: Diagnosis not present

## 2013-06-14 DIAGNOSIS — Z1331 Encounter for screening for depression: Secondary | ICD-10-CM | POA: Diagnosis not present

## 2013-06-14 DIAGNOSIS — E119 Type 2 diabetes mellitus without complications: Secondary | ICD-10-CM | POA: Diagnosis not present

## 2013-06-14 DIAGNOSIS — E041 Nontoxic single thyroid nodule: Secondary | ICD-10-CM

## 2013-06-14 DIAGNOSIS — Z79899 Other long term (current) drug therapy: Secondary | ICD-10-CM | POA: Diagnosis not present

## 2013-06-14 DIAGNOSIS — I10 Essential (primary) hypertension: Secondary | ICD-10-CM | POA: Diagnosis not present

## 2013-06-14 DIAGNOSIS — Z0000 Encounter for general adult medical examination without abnormal findings: Secondary | ICD-10-CM | POA: Diagnosis not present

## 2013-06-18 DIAGNOSIS — I10 Essential (primary) hypertension: Secondary | ICD-10-CM | POA: Diagnosis not present

## 2013-06-18 DIAGNOSIS — E119 Type 2 diabetes mellitus without complications: Secondary | ICD-10-CM | POA: Diagnosis not present

## 2013-06-18 DIAGNOSIS — D72819 Decreased white blood cell count, unspecified: Secondary | ICD-10-CM | POA: Diagnosis not present

## 2013-06-18 DIAGNOSIS — R002 Palpitations: Secondary | ICD-10-CM | POA: Diagnosis not present

## 2013-06-23 ENCOUNTER — Emergency Department (HOSPITAL_COMMUNITY): Payer: Medicare Other

## 2013-06-23 ENCOUNTER — Encounter (HOSPITAL_COMMUNITY): Payer: Self-pay | Admitting: Emergency Medicine

## 2013-06-23 ENCOUNTER — Inpatient Hospital Stay (HOSPITAL_COMMUNITY)
Admission: EM | Admit: 2013-06-23 | Discharge: 2013-06-24 | DRG: 313 | Disposition: A | Discharge status: Home / Self Care | Payer: Medicare Other | Attending: Internal Medicine | Admitting: Internal Medicine

## 2013-06-23 ENCOUNTER — Other Ambulatory Visit: Payer: Self-pay

## 2013-06-23 DIAGNOSIS — IMO0001 Reserved for inherently not codable concepts without codable children: Secondary | ICD-10-CM | POA: Diagnosis present

## 2013-06-23 DIAGNOSIS — K1370 Unspecified lesions of oral mucosa: Secondary | ICD-10-CM | POA: Diagnosis present

## 2013-06-23 DIAGNOSIS — R5381 Other malaise: Secondary | ICD-10-CM

## 2013-06-23 DIAGNOSIS — Z888 Allergy status to other drugs, medicaments and biological substances status: Secondary | ICD-10-CM | POA: Diagnosis not present

## 2013-06-23 DIAGNOSIS — N6019 Diffuse cystic mastopathy of unspecified breast: Secondary | ICD-10-CM | POA: Diagnosis present

## 2013-06-23 DIAGNOSIS — R079 Chest pain, unspecified: Secondary | ICD-10-CM | POA: Diagnosis not present

## 2013-06-23 DIAGNOSIS — I10 Essential (primary) hypertension: Secondary | ICD-10-CM | POA: Diagnosis present

## 2013-06-23 DIAGNOSIS — Z7982 Long term (current) use of aspirin: Secondary | ICD-10-CM | POA: Diagnosis not present

## 2013-06-23 DIAGNOSIS — G8929 Other chronic pain: Secondary | ICD-10-CM | POA: Diagnosis present

## 2013-06-23 DIAGNOSIS — I4510 Unspecified right bundle-branch block: Secondary | ICD-10-CM | POA: Diagnosis present

## 2013-06-23 DIAGNOSIS — R059 Cough, unspecified: Secondary | ICD-10-CM | POA: Diagnosis not present

## 2013-06-23 DIAGNOSIS — G241 Genetic torsion dystonia: Secondary | ICD-10-CM | POA: Diagnosis present

## 2013-06-23 DIAGNOSIS — Z79899 Other long term (current) drug therapy: Secondary | ICD-10-CM | POA: Diagnosis not present

## 2013-06-23 DIAGNOSIS — Z885 Allergy status to narcotic agent status: Secondary | ICD-10-CM | POA: Diagnosis not present

## 2013-06-23 DIAGNOSIS — K219 Gastro-esophageal reflux disease without esophagitis: Secondary | ICD-10-CM | POA: Diagnosis present

## 2013-06-23 DIAGNOSIS — R05 Cough: Secondary | ICD-10-CM | POA: Diagnosis not present

## 2013-06-23 DIAGNOSIS — R0789 Other chest pain: Principal | ICD-10-CM | POA: Diagnosis present

## 2013-06-23 DIAGNOSIS — E119 Type 2 diabetes mellitus without complications: Secondary | ICD-10-CM | POA: Diagnosis not present

## 2013-06-23 DIAGNOSIS — Z791 Long term (current) use of non-steroidal anti-inflammatories (NSAID): Secondary | ICD-10-CM

## 2013-06-23 DIAGNOSIS — R072 Precordial pain: Secondary | ICD-10-CM | POA: Diagnosis not present

## 2013-06-23 DIAGNOSIS — G248 Other dystonia: Secondary | ICD-10-CM | POA: Diagnosis present

## 2013-06-23 DIAGNOSIS — R404 Transient alteration of awareness: Secondary | ICD-10-CM | POA: Diagnosis not present

## 2013-06-23 DIAGNOSIS — R531 Weakness: Secondary | ICD-10-CM

## 2013-06-23 HISTORY — DX: Other dystonia: G24.8

## 2013-06-23 LAB — CBC WITH DIFFERENTIAL/PLATELET
Basophils Absolute: 0 K/uL (ref 0.0–0.1)
Basophils Relative: 0 % (ref 0–1)
Eosinophils Absolute: 0.2 K/uL (ref 0.0–0.7)
Eosinophils Relative: 3 % (ref 0–5)
HCT: 35.8 % — ABNORMAL LOW (ref 36.0–46.0)
Hemoglobin: 12.4 g/dL (ref 12.0–15.0)
Lymphocytes Relative: 39 % (ref 12–46)
Lymphs Abs: 2.6 K/uL (ref 0.7–4.0)
MCH: 33.2 pg (ref 26.0–34.0)
MCHC: 34.6 g/dL (ref 30.0–36.0)
MCV: 95.7 fL (ref 78.0–100.0)
Monocytes Absolute: 0.6 K/uL (ref 0.1–1.0)
Monocytes Relative: 9 % (ref 3–12)
Neutro Abs: 3.4 K/uL (ref 1.7–7.7)
Neutrophils Relative %: 50 % (ref 43–77)
Platelets: 289 K/uL (ref 150–400)
RBC: 3.74 MIL/uL — ABNORMAL LOW (ref 3.87–5.11)
RDW: 12.7 % (ref 11.5–15.5)
WBC: 6.7 K/uL (ref 4.0–10.5)

## 2013-06-23 LAB — URINALYSIS, ROUTINE W REFLEX MICROSCOPIC
Nitrite: NEGATIVE
Protein, ur: NEGATIVE mg/dL
Specific Gravity, Urine: 1.01 (ref 1.005–1.030)
Urobilinogen, UA: 0.2 mg/dL (ref 0.0–1.0)

## 2013-06-23 LAB — COMPREHENSIVE METABOLIC PANEL WITH GFR
ALT: 28 U/L (ref 0–35)
AST: 19 U/L (ref 0–37)
Albumin: 3.7 g/dL (ref 3.5–5.2)
Alkaline Phosphatase: 47 U/L (ref 39–117)
BUN: 12 mg/dL (ref 6–23)
CO2: 26 meq/L (ref 19–32)
Calcium: 9.3 mg/dL (ref 8.4–10.5)
Chloride: 101 meq/L (ref 96–112)
Creatinine, Ser: 0.73 mg/dL (ref 0.50–1.10)
GFR calc Af Amer: >90 mL/min
GFR calc non Af Amer: 80 mL/min — ABNORMAL LOW
Glucose, Bld: 146 mg/dL — ABNORMAL HIGH (ref 70–99)
Potassium: 3.5 meq/L (ref 3.5–5.1)
Sodium: 138 meq/L (ref 135–145)
Total Bilirubin: 0.5 mg/dL (ref 0.3–1.2)
Total Protein: 6.4 g/dL (ref 6.0–8.3)

## 2013-06-23 LAB — TROPONIN I: Troponin I: <0.3 ng/mL (ref <0.30)

## 2013-06-23 LAB — PRO B NATRIURETIC PEPTIDE: Pro B Natriuretic peptide (BNP): 72 pg/mL (ref 0–450)

## 2013-06-23 MED ORDER — ONDANSETRON HCL 4 MG/2ML IJ SOLN: 4.0000 mg | Freq: Four times a day (QID) | INTRAMUSCULAR | Status: DC | PRN

## 2013-06-23 MED ORDER — AMLODIPINE BESYLATE 10 MG PO TABS
10.0000 mg | ORAL_TABLET | Freq: Every day | ORAL | Status: DC
  Administered 2013-06-24: 10 mg via ORAL
  Filled 2013-06-23: qty 1

## 2013-06-23 MED ORDER — GLIPIZIDE 5 MG PO TABS
5.0000 mg | ORAL_TABLET | Freq: Two times a day (BID) | ORAL | Status: DC
  Administered 2013-06-24: 5 mg via ORAL
  Filled 2013-06-23 (×3): qty 1

## 2013-06-23 MED ORDER — ENOXAPARIN SODIUM 40 MG/0.4ML ~~LOC~~ SOLN
40.0000 mg | Freq: Every day | SUBCUTANEOUS | Status: DC
  Administered 2013-06-24: 01:00:00 40 mg via SUBCUTANEOUS
  Filled 2013-06-23 (×2): qty 0.4

## 2013-06-23 MED ORDER — ASPIRIN 325 MG PO TABS
325.0000 mg | ORAL_TABLET | Freq: Once | ORAL | Status: AC
  Administered 2013-06-23: 325 mg via ORAL
  Filled 2013-06-23: qty 1

## 2013-06-23 MED ORDER — LORATADINE 10 MG PO TABS
10.0000 mg | ORAL_TABLET | Freq: Every day | ORAL | Status: DC
  Filled 2013-06-23: qty 1

## 2013-06-23 MED ORDER — NITROGLYCERIN 2 % TD OINT
0.5000 [in_us] | TOPICAL_OINTMENT | Freq: Four times a day (QID) | TRANSDERMAL | Status: DC
  Administered 2013-06-23 – 2013-06-24 (×3): 0.5 [in_us] via TOPICAL
  Filled 2013-06-23 (×2): qty 30

## 2013-06-23 MED ORDER — ACETAMINOPHEN 325 MG PO TABS: 650.0000 mg | ORAL_TABLET | ORAL | Status: DC | PRN

## 2013-06-23 MED ORDER — ASPIRIN EC 325 MG PO TBEC
325.0000 mg | DELAYED_RELEASE_TABLET | Freq: Every day | ORAL | Status: DC
  Administered 2013-06-24: 325 mg via ORAL
  Filled 2013-06-23: qty 1

## 2013-06-23 MED ORDER — ONDANSETRON HCL 4 MG/2ML IJ SOLN: 4.0000 mg | Freq: Three times a day (TID) | INTRAMUSCULAR | Status: DC | PRN

## 2013-06-23 NOTE — H&P (Signed)
[facility] History and Physical  Tracy [name] AVW:[id number] DOB: [birth year] DOA: 06/23/2013  Referring physician: ER physician. PCP: Ginette Otto, MD   Chief Complaint: Chest pain and weakness.  HPI: Tracy [name] is a 79 y.o. female history of diabetes mellitus, hypertension and focal dystonia presented to the ER because of chest pain and weakness. Patient has been experiencing generalized weakness over the last 2 weeks that patient was feeling fatigued. Denies any dizziness or loss of consciousness or any focal deficits. In addition patient had experienced some palpitations 5 days ago and her primary care physician had placed her on Toprol after taking which she still of diarrhea so she stopped it. Last night patient developed some chest pain which lasted for an hour retrosternal pressure-like nonradiating which resolved spontaneously. Again patient started having some chest pain today morning which lasted for around 15 minutes and resolved. Since patient has been having this chest pain and weakness patient presented to the ER. In the ER patient was afebrile and EKG and chest x-ray cardiac markers were unremarkable. Patient at this time has been admitted for further management. In addition patient also was complaining of some right-sided gum pain which has been chronic.   Review of Systems: As presented in the history of presenting illness, rest negative.  Past Medical History  Diagnosis Date  . Diabetes mellitus   . Fibromyalgia   . Fibrocystic breast disease   . Hypertension   . GERD (gastroesophageal reflux disease)   . Carpal tunnel syndrome   . RBBB   . Mild cognitive impairment   . Focal dystonia    Past Surgical History  Procedure Laterality Date  . Cardiac catheterization    . Appendectomy    . Tonsillectomy     Social History:  reports that she has never smoked. She has never used smokeless tobacco. She reports that she does not drink alcohol or use  illicit drugs. Where does patient live assisted living home. Can patient participate in ADLs? Yes.  Allergies  Allergen Reactions  . Ace Inhibitors Cough  . Codeine   . Novocain [Procaine Hcl]     Family History:  Family History  Problem Relation Age of Onset  . CAD Father   . Stroke Other   . Liver cancer Other       Prior to Admission medications   Medication Sig Start Date End Date Taking? Authorizing Provider  amLODipine (NORVASC) 10 MG tablet Take 10 mg by mouth daily.   Yes Historical Provider, MD  aspirin 81 MG tablet Take 81 mg by mouth daily.   Yes Historical Provider, MD  bismuth subsalicylate (PEPTO BISMOL) 262 MG/15ML suspension Take 30 mLs by mouth every 6 (six) hours as needed for indigestion or diarrhea or loose stools.   Yes Historical Provider, MD  calcium-vitamin D (OSCAL WITH D) 500-200 MG-UNIT per tablet Take 1 tablet by mouth 3 (three) times daily.   Yes Historical Provider, MD  cetirizine (ZYRTEC) 10 MG tablet Take 5 mg by mouth daily.   Yes Historical Provider, MD  cholecalciferol (VITAMIN D) 1000 UNITS tablet Take 1,000 Units by mouth daily.   Yes Historical Provider, MD  glipiZIDE (GLUCOTROL) 5 MG tablet Take 5 mg by mouth 2 (two) times daily before a meal.    Yes Historical Provider, MD  glucosamine-chondroitin 500-400 MG tablet Take 1 tablet by mouth daily.   Yes Historical Provider, MD  hydrochlorothiazide (HYDRODIURIL) 12.5 MG tablet Take 12.5 mg by mouth daily.   Yes  Historical Provider, MD  KRILL OIL PO Take 1 capsule by mouth daily.   Yes Historical Provider, MD  metFORMIN (GLUCOPHAGE) 500 MG tablet Take 1 tablet (500 mg total) by mouth 2 (two) times daily with a meal. Do not restart Metformin until 7/12 am then continue prior home dosing of 1 tablet in am, 1 tablet at noon and 2 tablets in PM 01/04/12  Yes Quintella Reichert, MD  multivitamin-lutein (OCUVITE-LUTEIN) CAPS Take 1 capsule by mouth daily.   Yes Historical Provider, MD    Physical Exam: Filed  Vitals:   06/23/13 1717 06/23/13 1727 06/23/13 2057  BP:  157/77 130/84  Pulse:  85 73  Temp:  98.2 F (36.8 C)   TempSrc:  Oral   Resp:  18 20  SpO2: 97% 95% 97%     General:  Well developed and nourished.  Eyes: Anicteric no pallor.  ENT: No discharge from the ears eyes nose mouth.  Neck: No mass felt.  Cardiovascular: S1-S2 heard.  Respiratory: No rhonchi or crepitations.  Abdomen: Soft nontender bowel sounds present.  Skin: No rash.  Musculoskeletal: No edema.  Psychiatric: Appears normal.  Neurologic: Alert awake oriented to time place and person. Moves all extremities 5 x 5. Has good deep tendon reflexes.  Labs on Admission:  Basic Metabolic Panel:  Recent Labs Lab 06/23/13 1900  NA 138  K 3.5  CL 101  CO2 26  GLUCOSE 146*  BUN 12  CREATININE 0.73  CALCIUM 9.3   Liver Function Tests:  Recent Labs Lab 06/23/13 1900  AST 19  ALT 28  ALKPHOS 47  BILITOT 0.5  PROT 6.4  ALBUMIN 3.7   No results found for this basename: LIPASE, AMYLASE,  in the last 168 hours No results found for this basename: AMMONIA,  in the last 168 hours CBC:  Recent Labs Lab 06/23/13 1900  WBC 6.7  NEUTROABS 3.4  HGB 12.4  HCT 35.8*  MCV 95.7  PLT 289   Cardiac Enzymes:  Recent Labs Lab 06/23/13 1900  TROPONINI <0.30    BNP (last 3 results)  Recent Labs  06/23/13 1900  PROBNP 72.0   CBG: No results found for this basename: GLUCAP,  in the last 168 hours  Radiological Exams on Admission: Dg Chest 2 View  06/23/2013   CLINICAL DATA:  Productive cough. Generalized weakness. Current history of diabetes and hypertension.  EXAM: CHEST  2 VIEW  COMPARISON:  12/08/2010.  FINDINGS: Cardiac silhouette upper normal in size for the AP technique. Thoracic aorta atherosclerotic, unchanged. Hilar and mediastinal contours otherwise unremarkable. Lungs clear. Bronchovascular markings normal. Pulmonary vascularity normal. No visible pleural effusions. No  pneumothorax. Degenerative changes involving the thoracic spine. No significant interval change.  IMPRESSION: No acute cardiopulmonary disease.  Stable examination.   Electronically Signed   By: Hulan Saas M.D.   On: 06/23/2013 19:38    EKG: Independently reviewed. Sinus rhythm with RBBB.  Assessment/Plan Principal Problem:   Chest pain Active Problems:   Focal dystonia   Diabetes mellitus   HTN (hypertension)   1. Chest pain - heart score is 4. We will cycle cardiac markers to rule out ACS. Check 2-D echo. Aspirin. Patient was placed on nitroglycerin patch but the ER physician which we will continue. 2. Generalized weakness - patient is nonfocal. Check TSH and CK levels. Check orthostatics. I have advised patient if nothing acute was found and she may need further workup through her neurologist. 3. Chronic right gum pain - patient  wanted to see if she can get further workup done. She is following with endodontist for this. I have tried to order orthopantogram but radiology states that the machine is not working. Patient is to follow with her dentist for further workup. Patient is presently afebrile. I will place patient on amoxicillin for now. 4. Diabetes mellitus type 2 - closely follow CBGs with sliding-scale coverage. 5. Hypertension - I am holding of diuretics since patient is complaining weakness. Check orthostatics. Continue other medications.    Code Status: Full code.  Family Communication: Family at the bedside.  Disposition Plan: Admit for observation.    Yaxiel Minnie N. [facility] Pager [phone].  If 7PM-7AM, please contact night-coverage [email or web address] Password [email or web address] 06/23/2013, 11:08 PM

## 2013-06-23 NOTE — ED Notes (Signed)
Patient back from CXR Patient in NAD 

## 2013-06-23 NOTE — ED Notes (Addendum)
Per EMS pt reports generalized weakness progressing over a week. Pt has had 6 dental procedures since September; was advised by PCP that WBC are low. Pt reports chest pressure with exertion. Pt denies n/v. Pt reports diarrhea but contributes it to beta blockers prescribed to her.

## 2013-06-23 NOTE — ED Provider Notes (Signed)
CSN: [id number]     Arrival date & time 06/23/13  1716 History   First MD Initiated Contact with Patient 06/23/13 1825     Chief Complaint  Patient presents with  . Weakness   (Consider location/radiation/quality/duration/timing/severity/associated sxs/prior Treatment) Patient is a 79 y.o. female presenting with weakness and chest pain. The history is provided by the patient. No language interpreter was used.  Weakness This is a new problem. The current episode started more than 1 week ago. The problem occurs constantly. The problem has been gradually worsening. Associated symptoms include chest pain. Pertinent negatives include no abdominal pain, no headaches and no shortness of breath. The symptoms are aggravated by exertion. The symptoms are relieved by lying down and rest. Treatments tried: beta blocker. The treatment provided no relief.  Chest Pain Pain location:  Substernal area Pain quality: pressure   Pain radiates to:  Does not radiate Pain radiates to the back: no   Pain severity:  Moderate Onset quality:  Sudden Duration:  1 day Timing:  Intermittent Progression:  Waxing and waning Chronicity:  New Context comment:  First episode at 2am was at rest, lasting two hours.  Subsequent episodes were with exertion. Relieved by:  Rest Worsened by:  Exertion Ineffective treatments:  None tried Associated symptoms: fatigue and weakness   Associated symptoms: no abdominal pain, no back pain, no cough, no diaphoresis, no dizziness, no fever, no headache, no nausea, no near-syncope, no numbness, no palpitations, no shortness of breath, no syncope and not vomiting     Past Medical History  Diagnosis Date  . Diabetes mellitus   . Fibromyalgia   . Fibrocystic breast disease   . Hypertension   . GERD (gastroesophageal reflux disease)   . Carpal tunnel syndrome   . RBBB   . Mild cognitive impairment   . Focal dystonia    Past Surgical History  Procedure Laterality Date  .  Cardiac catheterization    . Appendectomy    . Tonsillectomy     Family History  Problem Relation Age of Onset  . CAD Father   . Stroke Other   . Liver cancer Other    History  Substance Use Topics  . Smoking status: Never Smoker   . Smokeless tobacco: Never Used  . Alcohol Use: No   OB History   Grav Para Term Preterm Abortions TAB SAB Ect Mult Living                 Review of Systems  Constitutional: Positive for fatigue. Negative for fever, chills, diaphoresis, activity change and appetite change.  HENT: Negative for congestion, facial swelling, rhinorrhea and sore throat.   Eyes: Negative for photophobia and discharge.  Respiratory: Negative for cough, chest tightness and shortness of breath.   Cardiovascular: Positive for chest pain. Negative for palpitations, leg swelling, syncope and near-syncope.  Gastrointestinal: Negative for nausea, vomiting, abdominal pain and diarrhea.  Endocrine: Negative for polydipsia and polyuria.  Genitourinary: Negative for dysuria, frequency, difficulty urinating and pelvic pain.  Musculoskeletal: Negative for arthralgias, back pain, neck pain and neck stiffness.  Skin: Negative for color change and wound.  Allergic/Immunologic: Negative for immunocompromised state.  Neurological: Positive for weakness. Negative for dizziness, facial asymmetry, numbness and headaches.  Hematological: Does not bruise/bleed easily.  Psychiatric/Behavioral: Negative for confusion and agitation.    Allergies  Ace inhibitors; Codeine; and Novocain  Home Medications   No current outpatient prescriptions on file. BP 117/55  Pulse 89  Temp(Src) 98 F (36.7  C) (Oral)  Resp 20  Ht 5\' 4"  (1.626 m)  Wt 150 lb 12.8 oz (68.402 kg)  BMI 25.87 kg/m2  SpO2 95% Physical Exam  Constitutional: She is oriented to person, place, and time. She appears well-developed and well-nourished. No distress.  HENT:  Head: Normocephalic and atraumatic.  Mouth/Throat: No  oropharyngeal exudate.  Eyes: Pupils are equal, round, and reactive to light.  Neck: Normal range of motion. Neck supple.  Cardiovascular: Normal rate, regular rhythm and normal heart sounds.  Exam reveals no gallop and no friction rub.   No murmur heard. Pulmonary/Chest: Effort normal and breath sounds normal. No respiratory distress. She has no wheezes. She has no rales.  Abdominal: Soft. Bowel sounds are normal. She exhibits no distension and no mass. There is no tenderness. There is no rebound and no guarding.  Musculoskeletal: Normal range of motion. She exhibits no edema and no tenderness.  Neurological: She is alert and oriented to person, place, and time.  Skin: Skin is warm and dry.  Psychiatric: She has a normal mood and affect.    ED Course  Procedures (including critical care time) Labs Review Labs Reviewed  CBC WITH DIFFERENTIAL - Abnormal; Notable for the following:    RBC 3.74 (*)    HCT 35.8 (*)    All other components within normal limits  COMPREHENSIVE METABOLIC PANEL - Abnormal; Notable for the following:    Glucose, Bld 146 (*)    GFR calc non Af Amer 80 (*)    All other components within normal limits  CBC - Abnormal; Notable for the following:    RBC 3.58 (*)    Hemoglobin 11.8 (*)    HCT 34.3 (*)    All other components within normal limits  CREATININE, SERUM - Abnormal; Notable for the following:    GFR calc non Af Amer 80 (*)    All other components within normal limits  GLUCOSE, CAPILLARY - Abnormal; Notable for the following:    Glucose-Capillary 154 (*)    All other components within normal limits  GLUCOSE, CAPILLARY - Abnormal; Notable for the following:    Glucose-Capillary 210 (*)    All other components within normal limits  URINE CULTURE  TROPONIN I  PRO B NATRIURETIC PEPTIDE  URINALYSIS, ROUTINE W REFLEX MICROSCOPIC  TROPONIN I  TROPONIN I  CK  TROPONIN I  TSH   Imaging Review Dg Chest 2 View  06/23/2013   CLINICAL DATA:   Productive cough. Generalized weakness. Current history of diabetes and hypertension.  EXAM: CHEST  2 VIEW  COMPARISON:  12/08/2010.  FINDINGS: Cardiac silhouette upper normal in size for the AP technique. Thoracic aorta atherosclerotic, unchanged. Hilar and mediastinal contours otherwise unremarkable. Lungs clear. Bronchovascular markings normal. Pulmonary vascularity normal. No visible pleural effusions. No pneumothorax. Degenerative changes involving the thoracic spine. No significant interval change.  IMPRESSION: No acute cardiopulmonary disease.  Stable examination.   Electronically Signed   By: Hulan Saas M.D.   On: 06/23/2013 19:38    EKG Interpretation    Date/Time:  Saturday June 23 2013 21:11:03 EST Ventricular Rate:  80 PR Interval:  192 QRS Duration: 126 QT Interval:  418 QTC Calculation: 482 R Axis:   -37 Text Interpretation:  Normal sinus rhythm Left axis deviation Right bundle branch block Abnormal ECG No significant change since last tracing Confirmed by Meleena Munroe  MD, Eliaz Fout ([id number]) on 06/23/2013 9:15:17 PM            MDM   1.  Chest pain   2. Generalized weakness   3. Diabetes mellitus   4. HTN (hypertension)    Pt is a 79 y.o. female with Pmhx as above who presents with 2 weeks of generalized weakness, several days of d/a since being started on a BB by PCP for elevated HR, now w/ episodic chest pressure since last PM.  First episode at rest, lasting 2 hrs.  Further episodes during course of day today only w/ exertion. EKG unchanged from prior.  First trop negative. CBC, CMP w/o acute findings.  BNP not elevated, CXR unremarkable.  I am unsure of cause of weakness, doubt CVA/TIA.  CP concerning given age & risk factors and I feel she requires ACS r/o.  [facility] will admit.         Shanna Cisco, MD 06/24/13 [phone]

## 2013-06-23 NOTE — ED Notes (Signed)
Patient medicated, see MAR Patient currently denies c/o CP, SOB  VS updated and stable Portable EKG being performed at bedside

## 2013-06-23 NOTE — ED Notes (Signed)
Bed: [facility] Expected date:  Expected time:  Means of arrival:  Comments: EMS  

## 2013-06-23 NOTE — ED Notes (Signed)
Assumed care of patient Patient taken to CXR--appears in NAD

## 2013-06-24 DIAGNOSIS — E119 Type 2 diabetes mellitus without complications: Secondary | ICD-10-CM | POA: Diagnosis not present

## 2013-06-24 DIAGNOSIS — I10 Essential (primary) hypertension: Secondary | ICD-10-CM | POA: Diagnosis not present

## 2013-06-24 DIAGNOSIS — R079 Chest pain, unspecified: Secondary | ICD-10-CM | POA: Diagnosis not present

## 2013-06-24 LAB — TROPONIN I
Troponin I: <0.3 ng/mL (ref <0.30)
Troponin I: <0.3 ng/mL (ref <0.30)

## 2013-06-24 LAB — CBC
HCT: 34.3 % — ABNORMAL LOW (ref 36.0–46.0)
Hemoglobin: 11.8 g/dL — ABNORMAL LOW (ref 12.0–15.0)
MCHC: 34.4 g/dL (ref 30.0–36.0)
MCV: 95.8 fL (ref 78.0–100.0)
RBC: 3.58 MIL/uL — ABNORMAL LOW (ref 3.87–5.11)

## 2013-06-24 LAB — CREATININE, SERUM: GFR calc non Af Amer: 80 mL/min — ABNORMAL LOW (ref >90)

## 2013-06-24 LAB — GLUCOSE, CAPILLARY
Glucose-Capillary: 210 mg/dL — ABNORMAL HIGH (ref 70–99)
Glucose-Capillary: 145 mg/dL — ABNORMAL HIGH (ref 70–99)

## 2013-06-24 LAB — CK: Total CK: 107 U/L (ref 7–177)

## 2013-06-24 MED ORDER — AMOXICILLIN 500 MG PO CAPS: 500.0000 mg | ORAL_CAPSULE | Freq: Three times a day (TID) | ORAL | Status: AC

## 2013-06-24 MED ORDER — AMOXICILLIN 500 MG PO CAPS
500.0000 mg | ORAL_CAPSULE | Freq: Three times a day (TID) | ORAL | Status: DC
  Administered 2013-06-24 (×3): 500 mg via ORAL
  Filled 2013-06-24 (×5): qty 1

## 2013-06-24 NOTE — Progress Notes (Deleted)
  Echocardiogram 2D Echocardiogram has been performed.  Tracy [name] 06/24/2013, 1:34 PM

## 2013-06-24 NOTE — Progress Notes (Signed)
Orthostatic vs taken:  Lying 139/58  74 Sitting 124/58  87 Standing 117/55  89  Pt continues to be generally weak but weakness is not increased and no dizziness upon movement

## 2013-06-24 NOTE — Discharge Summary (Signed)
Physician Discharge Summary  Tracy [name] NGE:[id number] DOB: 1933/08/10 DOA: 06/23/2013  PCP: Ginette Otto, MD  Admit date: 06/23/2013 Discharge date: 06/24/2013  Time spent: 25 minutes  Recommendations for Outpatient Follow-up:  1. Patient has an appointment on 12/30 with her PCP his partner, Dr. Johnella Moloney. 2. Patient will follow up with her endodontist  Discharge Diagnoses:  Principal Problem:   Chest pain Active Problems:   Focal dystonia   Diabetes mellitus   HTN (hypertension)   Discharge Condition: Improved, being discharged home  Diet recommendation: Low-sodium carb modified  Filed Weights   06/23/13 2300  Weight: 68.402 kg (150 lb 12.8 oz)    History of present illness:  79 y.o. female history of diabetes mellitus, hypertension and focal dystonia presented to the ER on 12/27 because of chest pain and weakness. Patient has been experiencing generalized weakness over the last 2 weeks that patient was feeling fatigued. Denies any dizziness or loss of consciousness or any focal deficits. In addition patient had experienced some palpitations 5 days ago and her primary care physician had placed her on Toprol after taking which she still of diarrhea so she stopped it. The night prior, patient developed some chest pain which lasted for an hour retrosternal pressure-like nonradiating which resolved spontaneously. Again patient started having some chest pain today morning which lasted for around 15 minutes and resolved. Since patient has been having this chest pain and weakness patient presented to the ER. In the ER patient was afebrile and EKG and chest x-ray cardiac markers were unremarkable. She was admitted to the hospitalist service. In addition patient also was complaining of some right-sided gum pain which has been chronic.    Hospital Course:  Principal Problem:   Chest pain: Patient remained chest pain-free during her hospitalization. Enzymes x3 were negative.  No evidence by telemetry. Orthostatics were unremarkable. Given that. the pain was quite atypical, low index of suspicion with normal EKG and enzymes. Patient will follow up with her primary care physician's partner who she has an appointment within 2 days. Active Problems:   Focal dystonia   Diabetes mellitus: Stable continue home meds   HTN (hypertension): Stable continue home meds Chronic right-sided gum pain: Patient has been following with an NSAID on this for this.  X-rays of her teeth and jaw were attempted, but this machine was not working. She is afebrile, but with some tenderness, patient started on amoxicillin. Given a prescription for total of 5 days of therapy and she'll followup with her endodontist as outpatient.  Procedures: None  Consultations:  None  Discharge Exam: Filed Vitals:   06/24/13 0627  BP: 117/55  Pulse: 89  Temp: 98 F (36.7 C)  Resp: 20    General: Alert and oriented x3, no acute distress Cardiovascular: Regular rate and rhythm, S1-S2 Respiratory: Clear to auscultation bilaterally  Discharge Instructions  Discharge Orders   Future Appointments Provider Department Dept Phone   07/04/2013 2:15 PM [facility] [place] [facility] Ginette Otto [facility]AGING AT [facility] [phone]   08/06/2013 3:00 PM Quintella Reichert, MD [facility] [phone]   Future Orders Complete By Expires   Diet - low sodium heart healthy  As directed    Increase activity slowly  As directed        Medication List         amLODipine 10 MG tablet  Commonly known as:  NORVASC  Take 10 mg by mouth daily.     amoxicillin 500 MG capsule  Commonly known as:  AMOXIL  Take 1 capsule (500 mg total) by mouth every 8 (eight) hours.     aspirin 81 MG tablet  Take 81 mg by mouth daily.     bismuth subsalicylate 262 MG/15ML suspension  Commonly known as:  PEPTO BISMOL  Take 30 mLs by mouth every 6 (six) hours as needed for indigestion or diarrhea or loose stools.      calcium-vitamin D 500-200 MG-UNIT per tablet  Commonly known as:  OSCAL WITH D  Take 1 tablet by mouth 3 (three) times daily.     cetirizine 10 MG tablet  Commonly known as:  ZYRTEC  Take 5 mg by mouth daily.     cholecalciferol 1000 UNITS tablet  Commonly known as:  VITAMIN D  Take 1,000 Units by mouth daily.     glipiZIDE 5 MG tablet  Commonly known as:  GLUCOTROL  Take 5 mg by mouth 2 (two) times daily before a meal.     glucosamine-chondroitin 500-400 MG tablet  Take 1 tablet by mouth daily.     hydrochlorothiazide 12.5 MG tablet  Commonly known as:  HYDRODIURIL  Take 12.5 mg by mouth daily.     KRILL OIL PO  Take 1 capsule by mouth daily.     metFORMIN 500 MG tablet  Commonly known as:  GLUCOPHAGE  Take 1 tablet (500 mg total) by mouth 2 (two) times daily with a meal. Do not restart Metformin until 7/12 am then continue prior home dosing of 1 tablet in am, 1 tablet at noon and 2 tablets in PM     multivitamin-lutein Caps capsule  Take 1 capsule by mouth daily.       Allergies  Allergen Reactions  . Ace Inhibitors Cough  . Codeine   . Novocain [Procaine Hcl]        Follow-up Information   Follow up with GATES,ROBERT NEVILL, MD. (As scheduled this Tuesday)    Specialty:  Internal Medicine   Contact information:   [address] [place] [address] [phone]        The results of significant diagnostics from this hospitalization (including imaging, microbiology, ancillary and laboratory) are listed below for reference.    Significant Diagnostic Studies: Dg Chest 2 View  06/23/2013   CLINICAL DATA:  Productive cough. Generalized weakness. Current history of diabetes and hypertension.  EXAM: CHEST  2 VIEW  COMPARISON:  12/08/2010.  FINDINGS: Cardiac silhouette upper normal in size for the AP technique. Thoracic aorta atherosclerotic, unchanged. Hilar and mediastinal contours otherwise unremarkable. Lungs clear. Bronchovascular markings  normal. Pulmonary vascularity normal. No visible pleural effusions. No pneumothorax. Degenerative changes involving the thoracic spine. No significant interval change.  IMPRESSION: No acute cardiopulmonary disease.  Stable examination.   Electronically Signed   By: Hulan Saas M.D.   On: 06/23/2013 19:38    Microbiology: No results found for this or any previous visit (from the past 240 hour(s)).   Labs: Basic Metabolic Panel:  Recent Labs Lab 06/23/13 1900 06/24/13 0231  NA 138  --   K 3.5  --   CL 101  --   CO2 26  --   GLUCOSE 146*  --   BUN 12  --   CREATININE 0.73 0.74  CALCIUM 9.3  --    Liver Function Tests:  Recent Labs Lab 06/23/13 1900  AST 19  ALT 28  ALKPHOS 47  BILITOT 0.5  PROT 6.4  ALBUMIN 3.7   No  results found for this basename: LIPASE, AMYLASE,  in the last 168 hours No results found for this basename: AMMONIA,  in the last 168 hours CBC:  Recent Labs Lab 06/23/13 1900 06/24/13 0231  WBC 6.7 7.2  NEUTROABS 3.4  --   HGB 12.4 11.8*  HCT 35.8* 34.3*  MCV 95.7 95.8  PLT 289 294   Cardiac Enzymes:  Recent Labs Lab 06/23/13 1900 06/24/13 0231 06/24/13 0830  CKTOTAL  --  107  --   TROPONINI <0.30 <0.30 <0.30   BNP: BNP (last 3 results)  Recent Labs  06/23/13 1900  PROBNP 72.0   CBG:  Recent Labs Lab 06/23/13 2331 06/24/13 0811 06/24/13 1211  GLUCAP 154* 210* 145*       Signed:  KRISHNAN,SENDIL K  [facility] 06/24/2013, 2:59 PM

## 2013-06-25 LAB — URINE CULTURE: Colony Count: NO GROWTH

## 2013-06-26 DIAGNOSIS — I10 Essential (primary) hypertension: Secondary | ICD-10-CM | POA: Diagnosis not present

## 2013-06-26 DIAGNOSIS — R002 Palpitations: Secondary | ICD-10-CM | POA: Diagnosis not present

## 2013-06-26 DIAGNOSIS — D72819 Decreased white blood cell count, unspecified: Secondary | ICD-10-CM | POA: Diagnosis not present

## 2013-06-26 DIAGNOSIS — K122 Cellulitis and abscess of mouth: Secondary | ICD-10-CM | POA: Diagnosis not present

## 2013-06-26 DIAGNOSIS — E119 Type 2 diabetes mellitus without complications: Secondary | ICD-10-CM | POA: Diagnosis not present

## 2013-06-29 ENCOUNTER — Encounter: Payer: Self-pay | Admitting: Cardiology

## 2013-07-04 ENCOUNTER — Ambulatory Visit
Admission: RE | Admit: 2013-07-04 | Discharge: 2013-07-04 | Disposition: A | Discharge status: Home / Self Care | Payer: TRICARE For Life (TFL) | Source: Ambulatory Visit | Attending: Geriatric Medicine | Admitting: Geriatric Medicine

## 2013-07-04 DIAGNOSIS — E041 Nontoxic single thyroid nodule: Secondary | ICD-10-CM

## 2013-07-04 DIAGNOSIS — E042 Nontoxic multinodular goiter: Secondary | ICD-10-CM | POA: Diagnosis not present

## 2013-07-12 DIAGNOSIS — Z780 Asymptomatic menopausal state: Secondary | ICD-10-CM | POA: Diagnosis not present

## 2013-07-25 ENCOUNTER — Encounter: Payer: Self-pay | Admitting: Neurology

## 2013-07-25 ENCOUNTER — Ambulatory Visit (INDEPENDENT_AMBULATORY_CARE_PROVIDER_SITE_OTHER): Payer: Medicare Other | Admitting: Neurology

## 2013-07-25 VITALS — BP 146/77 | HR 87 | Ht 65.0 in | Wt 154.0 lb

## 2013-07-25 DIAGNOSIS — G2589 Other specified extrapyramidal and movement disorders: Secondary | ICD-10-CM

## 2013-07-25 DIAGNOSIS — G241 Genetic torsion dystonia: Secondary | ICD-10-CM

## 2013-07-25 MED ORDER — INCOBOTULINUMTOXINA 50 UNITS IM SOLR: 50.0000 [IU] | Freq: Once | INTRAMUSCULAR | Status: DC

## 2013-07-25 NOTE — Progress Notes (Signed)
Provider:  Dr Hosie Poisson Referring Provider: Merlene Laughter, MD Primary Care Physician:  Ginette Otto, MD  CC:  Focal hand dystonia  HPI:  Tracy [name] is a 80 y.o. female here for repeat injections for focal hand dystonia. Since last visit has had some medical concerns. After following up with PCP found to have prolonged dental infection, given antibiotics, scheduled to have tooth extraction. Starting to feel better after antibiotic treatment. After the injection had minimal benefit, symptoms returned within one month. No adverse effects.   Continues to have difficulty using L hand, most notable when trying to play the piano. Notes contract/flexion of her fingers, most notable with the 3rd/4th digits. Thumb also flexes in.   Contraindications and precautions discussed with patient. EMG guidance was used to inject muscles detailed below. Aseptic procedure was observed and patient tolerated procedure. Procedure performed by Dr. Elspeth Cho.  The condition has existed for more than 6 months, and pt does not have a diagnosis of ALS, Myasthenia Gravis or Lambert-Eaton Syndrome.  Risks and benefits of injections discussed and pt agrees to proceed with the procedure.  Written consent obtained These injections are medically necessary. He receives good benefits from these injections. These injections do not cause sedations or hallucinations which the oral therapies may cause.  Indication/Diagnosis: focal hand dystonia  Type of toxin:Xeomin  Lot # [id number]  Expiration date:06/2015  2:1 dilution  Injection sites: L FDS: 20 L FDP: 10     History   Social History  . Marital Status: Married    Spouse Name: N/A    Number of Children: 2  . Years of Education: masters   Occupational History  . retired    Social History Main Topics  . Smoking status: Never Smoker   . Smokeless tobacco: Never Used  . Alcohol Use: No  . Drug Use: No  . Sexual Activity: Not on file   Other Topics  Concern  . Not on file   Social History Narrative   Patient is widowed, has 2 children   Patient is right handed   Education level is Master's degree   Caffeine consumption is 2 cups daily    Family History  Problem Relation Age of Onset  . CAD Father   . Stroke Other   . Liver cancer Other     Past Medical History  Diagnosis Date  . Diabetes mellitus   . Fibromyalgia   . Fibrocystic breast disease   . Hypertension   . GERD (gastroesophageal reflux disease)   . Carpal tunnel syndrome   . RBBB   . Mild cognitive impairment   . Focal dystonia     Past Surgical History  Procedure Laterality Date  . Cardiac catheterization    . Appendectomy    . Tonsillectomy      Current Outpatient Prescriptions  Medication Sig Dispense Refill  . ACCU-CHEK AVIVA PLUS test strip       . amLODipine (NORVASC) 5 MG tablet Take 5 mg by mouth daily.      Marland Kitchen aspirin 81 MG tablet Take 81 mg by mouth daily.      Marland Kitchen bismuth subsalicylate (PEPTO BISMOL) 262 MG/15ML suspension Take 30 mLs by mouth every 6 (six) hours as needed for indigestion or diarrhea or loose stools.      . calcium-vitamin D (OSCAL WITH D) 500-200 MG-UNIT per tablet Take 1 tablet by mouth 3 (three) times daily.      . cetirizine (ZYRTEC) 10 MG tablet Take 5 mg  by mouth daily.      . cholecalciferol (VITAMIN D) 1000 UNITS tablet Take 1,000 Units by mouth daily.      Marland Kitchen. glipiZIDE (GLUCOTROL) 5 MG tablet Take 5 mg by mouth 2 (two) times daily before a meal.       . glucosamine-chondroitin 500-400 MG tablet Take 1 tablet by mouth daily.      . hydrochlorothiazide (HYDRODIURIL) 12.5 MG tablet Take 12.5 mg by mouth daily.      Marland Kitchen. KRILL OIL PO Take 1 capsule by mouth daily.      . metFORMIN (GLUCOPHAGE) 500 MG tablet Take 1 tablet (500 mg total) by mouth 2 (two) times daily with a meal. Do not restart Metformin until 7/12 am then continue prior home dosing of 1 tablet in am, 1 tablet at noon and 2 tablets in PM      . metoprolol  succinate (TOPROL-XL) 25 MG 24 hr tablet       . multivitamin-lutein (OCUVITE-LUTEIN) CAPS Take 1 capsule by mouth daily.       Current Facility-Administered Medications  Medication Dose Route Frequency Provider Last Rate Last Dose  . incobotulinumtoxinA (XEOMIN) 50 UNITS injection 50 Units  50 Units Intramuscular Once Omelia BlackwaterPeter Justin Amarilys Lyles, DO        Allergies as of 07/25/2013 - Review Complete 07/25/2013  Allergen Reaction Noted  . Ace inhibitors Cough 12/31/2011  . Codeine  12/31/2011  . Novocain [procaine hcl]  12/31/2011    Vitals: BP 146/77  Pulse 87  Ht 5\' 5"  (1.651 m)  Wt 154 lb (69.854 kg)  BMI 25.63 kg/m2 Last Weight:  Wt Readings from Last 1 Encounters:  07/25/13 154 lb (69.854 kg)   Last Height:   Ht Readings from Last 1 Encounters:  07/25/13 5\' 5"  (1.651 m)      Assessment:  After physical and neurologic examination, review of laboratory studies, imaging, neurophysiology testing and pre-existing records, assessment will be reviewed on the problem list.  Plan:  Treatment plan and additional workup will be reviewed under Problem List.  Tracy [name] is a 80 y.o. female here for botulinum toxin injections for focal hand dystonia. She tolerated procedure well with no complications.   1) Botox injections as detailed above. A total of 30 units was used. 20 units wasted 2) Tylenol or Motrin for injection site pain. 3) Medication guide dispensed. 4) Follow up for repeat injections in 3 months

## 2013-07-30 ENCOUNTER — Encounter: Payer: Self-pay | Admitting: General Surgery

## 2013-07-30 DIAGNOSIS — I10 Essential (primary) hypertension: Secondary | ICD-10-CM

## 2013-07-30 DIAGNOSIS — I2510 Atherosclerotic heart disease of native coronary artery without angina pectoris: Secondary | ICD-10-CM

## 2013-07-30 DIAGNOSIS — E782 Mixed hyperlipidemia: Secondary | ICD-10-CM | POA: Insufficient documentation

## 2013-08-06 ENCOUNTER — Ambulatory Visit: Payer: Medicare Other | Admitting: Cardiology

## 2013-08-07 ENCOUNTER — Ambulatory Visit (INDEPENDENT_AMBULATORY_CARE_PROVIDER_SITE_OTHER): Payer: Medicare Other | Admitting: Cardiology

## 2013-08-07 ENCOUNTER — Encounter: Payer: Self-pay | Admitting: Cardiology

## 2013-08-07 VITALS — BP 150/82 | HR 75 | Ht 64.0 in | Wt 157.8 lb

## 2013-08-07 DIAGNOSIS — I452 Bifascicular block: Secondary | ICD-10-CM | POA: Diagnosis not present

## 2013-08-07 DIAGNOSIS — E782 Mixed hyperlipidemia: Secondary | ICD-10-CM

## 2013-08-07 DIAGNOSIS — I10 Essential (primary) hypertension: Secondary | ICD-10-CM | POA: Diagnosis not present

## 2013-08-07 NOTE — Patient Instructions (Addendum)
Your physician has recommended you make the following change in your medication: 1. Stop taking Asprin  Your physician has requested that you regularly monitor and record your blood pressure readings at home. Please use the same machine at the same time of day to check your readings and record them for one week and call us with the results.   Your physician wants you to follow-up in: 12 Months with Dr Sherlyn Lickurner You will receive a reminder letter in the mail two months in advance. If you don't receive a letter, please call our office to schedule the follow-up appointment.

## 2013-08-07 NOTE — Progress Notes (Signed)
[address] [place], [place]  [address] Phone: [phone] Fax:  [phone]  Date:  08/07/2013   ID:  Tracy [name], DOB 12-Aug-1933, MRN [id number]  PCP:  Ginette OttoSTONEKING,HAL THOMAS, MD  Cardiologist:  Armanda Magicraci Turner, MD     History of Present Illness: Tracy [name] is a 80 y.o. female with a history of chronic RBBB, HTN, ASCAD and dyslipidemia.  SHe is doing well.  She denies any chest pain, SOB, DOE, LE edema, dizziness, palpitations or syncope.   Wt Readings from Last 3 Encounters:  08/07/13 157 lb 12.8 oz (71.578 kg)  07/25/13 154 lb (69.854 kg)  06/23/13 150 lb 12.8 oz (68.402 kg)     Past Medical History  Diagnosis Date  . Diabetes mellitus   . Fibromyalgia   . Fibrocystic breast disease   . Hypertension   . GERD (gastroesophageal reflux disease)   . Carpal tunnel syndrome   . RBBB   . Mild cognitive impairment   . Focal dystonia     Current Outpatient Prescriptions  Medication Sig Dispense Refill  . ACCU-CHEK AVIVA PLUS test strip       . amLODipine (NORVASC) 5 MG tablet Take 5 mg by mouth daily.      Marland Kitchen. aspirin 81 MG tablet Take 81 mg by mouth daily.      Marland Kitchen. bismuth subsalicylate (PEPTO BISMOL) 262 MG/15ML suspension Take 30 mLs by mouth every 6 (six) hours as needed for indigestion or diarrhea or loose stools.      . calcium-vitamin D (OSCAL WITH D) 500-200 MG-UNIT per tablet Take 1 tablet by mouth 3 (three) times daily.      . cetirizine (ZYRTEC) 10 MG tablet Take 5 mg by mouth daily.      . cholecalciferol (VITAMIN D) 1000 UNITS tablet Take 1,000 Units by mouth daily.      Marland Kitchen. glipiZIDE (GLUCOTROL) 5 MG tablet Take 2.5 mg by mouth 2 (two) times daily before a meal.       . glucosamine-chondroitin 500-400 MG tablet Take 1 tablet by mouth daily.      . hydrochlorothiazide (HYDRODIURIL) 12.5 MG tablet Take 12.5 mg by mouth daily.      Marland Kitchen. KRILL OIL PO Take 1 capsule by mouth daily.      . metFORMIN (GLUCOPHAGE) 500 MG tablet Take 1 tablet (500 mg total) by  mouth 2 (two) times daily with a meal. Do not restart Metformin until 7/12 am then continue prior home dosing of 1 tablet in am, 1 tablet at noon and 2 tablets in PM      . multivitamin-lutein (OCUVITE-LUTEIN) CAPS Take 1 capsule by mouth daily.       Current Facility-Administered Medications  Medication Dose Route Frequency Provider Last Rate Last Dose  . incobotulinumtoxinA (XEOMIN) 50 UNITS injection 50 Units  50 Units Intramuscular Once Omelia BlackwaterPeter Justin Sumner, DO      . incobotulinumtoxinA [facility](XEOMIN) 50 UNITS injection 50 Units  50 Units Intramuscular Once Omelia BlackwaterPeter Justin Sumner, DO        Allergies:    Allergies  Allergen Reactions  . Ace Inhibitors Cough  . Codeine   . Mold Extract [Trichophyton]   . Novocain [Procaine Hcl]   . Xylocaine Jelly [Lidocaine Hcl]     Social History:  The patient  reports that she has never smoked. She has never used smokeless tobacco. She reports that she does not drink alcohol or use illicit drugs.   Family History:  The patient's  family history includes CAD in her father; Liver cancer in her other; Stroke in her other.   ROS:  Please see the history of present illness.      All other systems reviewed and negative.   PHYSICAL EXAM: VS:  BP 150/82  Pulse 75  Ht 5\' 4"  (1.626 m)  Wt 157 lb 12.8 oz (71.578 kg)  BMI 27.07 kg/m2 Well nourished, well developed, in no acute distress HEENT: normal Neck: no JVD Cardiac:  normal S1, S2; RRR; no murmur Lungs:  clear to auscultation bilaterally, no wheezing, rhonchi or rales Abd: soft, nontender, no hepatomegaly Ext: trace edema Skin: warm and dry Neuro:  CNs 2-12 intact, no focal abnormalities noted  EKG:  NSR with RBBB and LAFB     ASSESSMENT AND PLAN:  1. Chronic RBBB with LAFB 2. HTN - mildly elevated today  - continue amlodipine/HCTZ  - I have asked her to have the nurse at her health clinic where she lives check her BP 3 times next week and call with the results.  3. Dyslipidemia - her LDL was 70  which is at goal 4.  Normal coronary arteries at cath - I have told her she can stop her ASA Followup with me in 1 year Signed, Armanda Magic, MD 08/07/2013 3:20 PM

## 2013-08-08 DIAGNOSIS — H2510 Age-related nuclear cataract, unspecified eye: Secondary | ICD-10-CM | POA: Diagnosis not present

## 2013-08-08 DIAGNOSIS — E119 Type 2 diabetes mellitus without complications: Secondary | ICD-10-CM | POA: Diagnosis not present

## 2013-08-27 ENCOUNTER — Telehealth: Payer: Self-pay | Admitting: Neurology

## 2013-08-27 NOTE — Telephone Encounter (Signed)
Called pt to schedule Xeomin injection and she states she would like to speak to the provider first. She states she does not see any change with the injection. Please call.

## 2013-09-03 ENCOUNTER — Encounter: Payer: Self-pay | Admitting: Neurology

## 2013-09-05 DIAGNOSIS — M999 Biomechanical lesion, unspecified: Secondary | ICD-10-CM | POA: Diagnosis not present

## 2013-09-05 DIAGNOSIS — M9981 Other biomechanical lesions of cervical region: Secondary | ICD-10-CM | POA: Diagnosis not present

## 2013-09-05 DIAGNOSIS — M542 Cervicalgia: Secondary | ICD-10-CM | POA: Diagnosis not present

## 2013-09-05 DIAGNOSIS — M545 Low back pain, unspecified: Secondary | ICD-10-CM | POA: Diagnosis not present

## 2013-09-05 DIAGNOSIS — M546 Pain in thoracic spine: Secondary | ICD-10-CM | POA: Diagnosis not present

## 2013-09-13 DIAGNOSIS — M545 Low back pain, unspecified: Secondary | ICD-10-CM | POA: Diagnosis not present

## 2013-09-13 DIAGNOSIS — M9981 Other biomechanical lesions of cervical region: Secondary | ICD-10-CM | POA: Diagnosis not present

## 2013-09-13 DIAGNOSIS — M542 Cervicalgia: Secondary | ICD-10-CM | POA: Diagnosis not present

## 2013-09-13 DIAGNOSIS — M546 Pain in thoracic spine: Secondary | ICD-10-CM | POA: Diagnosis not present

## 2013-09-13 DIAGNOSIS — M999 Biomechanical lesion, unspecified: Secondary | ICD-10-CM | POA: Diagnosis not present

## 2013-09-19 DIAGNOSIS — M542 Cervicalgia: Secondary | ICD-10-CM | POA: Diagnosis not present

## 2013-09-19 DIAGNOSIS — M9981 Other biomechanical lesions of cervical region: Secondary | ICD-10-CM | POA: Diagnosis not present

## 2013-09-19 DIAGNOSIS — M546 Pain in thoracic spine: Secondary | ICD-10-CM | POA: Diagnosis not present

## 2013-09-19 DIAGNOSIS — M999 Biomechanical lesion, unspecified: Secondary | ICD-10-CM | POA: Diagnosis not present

## 2013-09-19 DIAGNOSIS — M545 Low back pain, unspecified: Secondary | ICD-10-CM | POA: Diagnosis not present

## 2013-10-01 DIAGNOSIS — M545 Low back pain, unspecified: Secondary | ICD-10-CM | POA: Diagnosis not present

## 2013-10-01 DIAGNOSIS — M546 Pain in thoracic spine: Secondary | ICD-10-CM | POA: Diagnosis not present

## 2013-10-01 DIAGNOSIS — M999 Biomechanical lesion, unspecified: Secondary | ICD-10-CM | POA: Diagnosis not present

## 2013-10-01 DIAGNOSIS — M542 Cervicalgia: Secondary | ICD-10-CM | POA: Diagnosis not present

## 2013-10-01 DIAGNOSIS — M9981 Other biomechanical lesions of cervical region: Secondary | ICD-10-CM | POA: Diagnosis not present

## 2013-10-15 DIAGNOSIS — M999 Biomechanical lesion, unspecified: Secondary | ICD-10-CM | POA: Diagnosis not present

## 2013-10-15 DIAGNOSIS — M545 Low back pain, unspecified: Secondary | ICD-10-CM | POA: Diagnosis not present

## 2013-10-15 DIAGNOSIS — M9981 Other biomechanical lesions of cervical region: Secondary | ICD-10-CM | POA: Diagnosis not present

## 2013-10-15 DIAGNOSIS — M542 Cervicalgia: Secondary | ICD-10-CM | POA: Diagnosis not present

## 2013-10-15 DIAGNOSIS — M546 Pain in thoracic spine: Secondary | ICD-10-CM | POA: Diagnosis not present

## 2013-10-20 ENCOUNTER — Encounter: Payer: Self-pay | Admitting: Neurology

## 2013-10-23 ENCOUNTER — Encounter: Payer: Self-pay | Admitting: Neurology

## 2013-10-23 NOTE — Telephone Encounter (Signed)
Please pull labs and all tests form centricity for this patient  .

## 2013-10-24 ENCOUNTER — Ambulatory Visit (INDEPENDENT_AMBULATORY_CARE_PROVIDER_SITE_OTHER): Payer: Medicare Other | Admitting: Neurology

## 2013-10-24 ENCOUNTER — Encounter: Payer: Self-pay | Admitting: Neurology

## 2013-10-24 VITALS — BP 152/79 | HR 84 | Ht 64.0 in | Wt 156.0 lb

## 2013-10-24 DIAGNOSIS — R4189 Other symptoms and signs involving cognitive functions and awareness: Secondary | ICD-10-CM

## 2013-10-24 DIAGNOSIS — I2510 Atherosclerotic heart disease of native coronary artery without angina pectoris: Secondary | ICD-10-CM | POA: Diagnosis not present

## 2013-10-24 DIAGNOSIS — F09 Unspecified mental disorder due to known physiological condition: Secondary | ICD-10-CM | POA: Diagnosis not present

## 2013-10-24 NOTE — Telephone Encounter (Signed)
Dear Mrs Tracy [name],  [place] all the information you will need. Dr Hosie PoissonSumner assistant collected the data for the visit. CD

## 2013-10-24 NOTE — Patient Instructions (Signed)
Overall you are doing fairly well but I do want to suggest a few things today:   As far as diagnostic testing:  1)I would like you to have some blood work checked today. Please check out and then they will send you to our lab 2)I would like you to undergo formal cognitive testing with neuro-psychology. You will be called to schedule this  Follow up once testing is completed.   My clinical assistant and will answer any of your questions and relay your messages to me and also relay most of my messages to you.   Our phone number is [phone]. We also have an after hours call service for urgent matters and there is a physician on-call for urgent questions. For any emergencies you know to call 911 or go to the nearest emergency room

## 2013-10-24 NOTE — Progress Notes (Signed)
Provider:  Dr Hosie PoissonSumner Referring Provider: Merlene LaughterStoneking, Hal, MD Primary Care Physician:  Ginette OttoSTONEKING,HAL THOMAS, MD  CC:  Focal hand dystonia  HPI:  Tracy [name] is a 80 y.o. female here for repeat injections for focal hand dystonia. Since last visit has had some medical concerns. She notes some concern over speech difficulties, notes some difficulty with getting the words out, notes her tongue is "tangled" and the words are coming out differently, she feels the words are slightly slurred. She continues to have difficulty with reading music, trouble hitting certain cords. Feels she has difficulty reading music and applying it. Denies any difficulty with quick calculations. Also notes some difficulties with fine motor tasks of her left hand, trouble cutting things, trouble opening jars.  Will hold off on Xeomin injections at this visit to further work up question of cognitive issues and speech difficulties.     History   Social History  . Marital Status: Married    Spouse Name: N/A    Number of Children: 2  . Years of Education: masters   Occupational History  . retired    Social History Main Topics  . Smoking status: Never Smoker   . Smokeless tobacco: Never Used  . Alcohol Use: No  . Drug Use: No  . Sexual Activity: Not on file   Other Topics Concern  . Not on file   Social History Narrative   Patient is widowed, has 2 children   Patient is right handed   Education level is Master's degree   Caffeine consumption is 2 cups daily    Family History  Problem Relation Age of Onset  . CAD Father   . Stroke Other   . Liver cancer Other     Past Medical History  Diagnosis Date  . Diabetes mellitus   . Fibromyalgia   . Fibrocystic breast disease   . Hypertension   . GERD (gastroesophageal reflux disease)   . Carpal tunnel syndrome   . RBBB   . Mild cognitive impairment   . Focal dystonia   . CAD (coronary artery disease), native coronary artery     Past Surgical  History  Procedure Laterality Date  . Appendectomy    . Tonsillectomy    . Cardiac catheterization      normal    Current Outpatient Prescriptions  Medication Sig Dispense Refill  . ACCU-CHEK AVIVA PLUS test strip       . amLODipine (NORVASC) 5 MG tablet Take 5 mg by mouth daily.      Marland Kitchen. bismuth subsalicylate (PEPTO BISMOL) 262 MG/15ML suspension Take 30 mLs by mouth every 6 (six) hours as needed for indigestion or diarrhea or loose stools.      . calcium-vitamin D (OSCAL WITH D) 500-200 MG-UNIT per tablet Take 1 tablet by mouth 3 (three) times daily.      . cetirizine (ZYRTEC) 10 MG tablet Take 5 mg by mouth daily.      . cholecalciferol (VITAMIN D) 1000 UNITS tablet Take 1,000 Units by mouth daily.      Marland Kitchen. glipiZIDE (GLUCOTROL) 5 MG tablet Take 2.5 mg by mouth 2 (two) times daily before a meal.       . glucosamine-chondroitin 500-400 MG tablet Take 1 tablet by mouth daily.      . hydrochlorothiazide (HYDRODIURIL) 12.5 MG tablet Take 12.5 mg by mouth daily.      Marland Kitchen. KRILL OIL PO Take 1 capsule by mouth daily.      .Marland Kitchen  metFORMIN (GLUCOPHAGE) 500 MG tablet Take 1 tablet (500 mg total) by mouth 2 (two) times daily with a meal. Do not restart Metformin until 7/12 am then continue prior home dosing of 1 tablet in am, 1 tablet at noon and 2 tablets in PM      . multivitamin-lutein (OCUVITE-LUTEIN) CAPS Take 1 capsule by mouth daily.       Current Facility-Administered Medications  Medication Dose Route Frequency Provider Last Rate Last Dose  . incobotulinumtoxinA (XEOMIN) 50 UNITS injection 50 Units  50 Units Intramuscular Once Omelia BlackwaterPeter Justin Lauretta Sallas, DO      . incobotulinumtoxinA [facility](XEOMIN) 50 UNITS injection 50 Units  50 Units Intramuscular Once Omelia BlackwaterPeter Justin Aison Malveaux, DO        Allergies as of 10/24/2013 - Review Complete 08/07/2013  Allergen Reaction Noted  . Ace inhibitors Cough 12/31/2011  . Codeine  12/31/2011  . Mold extract [trichophyton]  07/30/2013  . Novocain [procaine hcl]  12/31/2011  .  Xylocaine jelly [lidocaine hcl]  07/30/2013    Vitals: There were no vitals taken for this visit. Last Weight:  Wt Readings from Last 1 Encounters:  08/07/13 157 lb 12.8 oz (71.578 kg)   Last Height:   Ht Readings from Last 1 Encounters:  08/07/13 5\' 4"  (1.626 m)      Assessment:  After physical and neurologic examination, review of laboratory studies, imaging, neurophysiology testing and pre-existing records, assessment will be reviewed on the problem list.  Plan:  Treatment plan and additional workup will be reviewed under Problem List.  1)Focal hand dystonia 2)Speech difficulties 3)Cognitive decline  Tracy [name] is a 80 y.o. female here for botulinum toxin injections for focal hand dystonia. Since last visit she expresses concern over speech difficulties and continued difficulty with reading music and processing the music. Will hold off on repeat injections at this point pending further workup. Will refer patient to neuro-psychology for formal cognitive testing to determine if there is an underlying cognitive deficit. Will follow up once cognitive testing is completed. She notes some increased fatigue. Will check B12 and TSH levels.

## 2013-10-28 ENCOUNTER — Encounter: Payer: Self-pay | Admitting: Neurology

## 2013-10-29 LAB — VITAMIN B1, WHOLE BLOOD: THIAMINE: 186.5 nmol/L (ref 66.5–200.0)

## 2013-10-29 LAB — VITAMIN B12: Vitamin B-12: <31 pg/mL — ABNORMAL LOW (ref 211–946)

## 2013-10-29 LAB — TSH: TSH: 1.29 u[IU]/mL (ref 0.450–4.500)

## 2013-10-30 ENCOUNTER — Other Ambulatory Visit: Payer: Self-pay | Admitting: Neurology

## 2013-10-30 DIAGNOSIS — R4189 Other symptoms and signs involving cognitive functions and awareness: Secondary | ICD-10-CM

## 2013-10-30 NOTE — Progress Notes (Signed)
Quick Note:  Spoke to patient and relayed low B12 results, and relayed B12 injection schedule, per Dr. Hosie PoissonSumner. She will call back to schedule once she works out her transportation. ______

## 2013-10-31 ENCOUNTER — Ambulatory Visit (INDEPENDENT_AMBULATORY_CARE_PROVIDER_SITE_OTHER): Payer: Medicare Other | Admitting: Neurology

## 2013-10-31 DIAGNOSIS — E538 Deficiency of other specified B group vitamins: Secondary | ICD-10-CM | POA: Diagnosis not present

## 2013-10-31 MED ORDER — CYANOCOBALAMIN 1000 MCG/ML IJ SOLN
1000.0000 ug | Freq: Once | INTRAMUSCULAR | Status: AC
  Administered 2013-10-31: 1000 ug via INTRAMUSCULAR

## 2013-10-31 NOTE — Progress Notes (Signed)
Patient here for B12 injection.  Under aseptic technique Cyanocobalamin given IM in right deltoid.  Tolerated well.  Band-Aid applied. 

## 2013-11-01 ENCOUNTER — Ambulatory Visit (INDEPENDENT_AMBULATORY_CARE_PROVIDER_SITE_OTHER): Payer: Medicare Other | Admitting: Neurology

## 2013-11-01 DIAGNOSIS — E538 Deficiency of other specified B group vitamins: Secondary | ICD-10-CM | POA: Diagnosis not present

## 2013-11-01 MED ORDER — CYANOCOBALAMIN 1000 MCG/ML IJ SOLN
1000.0000 ug | Freq: Once | INTRAMUSCULAR | Status: AC
  Administered 2013-11-01: 1000 ug via INTRAMUSCULAR

## 2013-11-01 NOTE — Patient Instructions (Signed)
Patient will return tomorrow for day3/5 B12 infusion.

## 2013-11-01 NOTE — Progress Notes (Signed)
Patient here for B12 injection.  Under aseptic technique Cyanocobalamin given IM in left deltoid.  Tolerated well.  Band-Aid applied. 

## 2013-11-02 ENCOUNTER — Ambulatory Visit (INDEPENDENT_AMBULATORY_CARE_PROVIDER_SITE_OTHER): Payer: Medicare Other | Admitting: Neurology

## 2013-11-02 DIAGNOSIS — E538 Deficiency of other specified B group vitamins: Secondary | ICD-10-CM | POA: Diagnosis not present

## 2013-11-02 MED ORDER — CYANOCOBALAMIN 1000 MCG/ML IJ SOLN
1000.0000 ug | Freq: Once | INTRAMUSCULAR | Status: AC
  Administered 2013-11-02: 1000 ug via INTRAMUSCULAR

## 2013-11-02 NOTE — Progress Notes (Signed)
Patient here for B12 injection, day 3/5.  Under aseptic technique Cyanocobalamin given IM in right gluteal.  Tolerated well.  Band-Aid applied.

## 2013-11-02 NOTE — Patient Instructions (Signed)
Patient will return Monday for day 4/5.

## 2013-11-05 ENCOUNTER — Ambulatory Visit (INDEPENDENT_AMBULATORY_CARE_PROVIDER_SITE_OTHER): Payer: Medicare Other | Admitting: Neurology

## 2013-11-05 DIAGNOSIS — E538 Deficiency of other specified B group vitamins: Secondary | ICD-10-CM | POA: Diagnosis not present

## 2013-11-05 MED ORDER — CYANOCOBALAMIN 1000 MCG/ML IJ SOLN
1000.0000 ug | Freq: Once | INTRAMUSCULAR | Status: AC
  Administered 2013-11-05: 1000 ug via INTRAMUSCULAR

## 2013-11-05 NOTE — Patient Instructions (Signed)
Patient will return tomorrow for day 5/5.

## 2013-11-05 NOTE — Progress Notes (Signed)
Patient here for B12 injection.  Under aseptic technique Cyanocobalamin given IM in left gluteal.  Tolerated well.  Band-Aid applied. 

## 2013-11-06 ENCOUNTER — Ambulatory Visit (INDEPENDENT_AMBULATORY_CARE_PROVIDER_SITE_OTHER): Payer: Medicare Other | Admitting: Neurology

## 2013-11-06 DIAGNOSIS — E538 Deficiency of other specified B group vitamins: Secondary | ICD-10-CM | POA: Diagnosis not present

## 2013-11-06 MED ORDER — CYANOCOBALAMIN 1000 MCG/ML IJ SOLN
1000.0000 ug | Freq: Once | INTRAMUSCULAR | Status: AC
  Administered 2013-11-06: 1000 ug via INTRAMUSCULAR

## 2013-11-06 NOTE — Progress Notes (Signed)
Patient here for B12 injection.  Under aseptic technique Cyanocobalamin given IM in left deltoid.  Tolerated well.  Band-Aid applied. 

## 2013-11-06 NOTE — Patient Instructions (Signed)
Patient will return next week for once a week B12 injections.

## 2013-11-14 ENCOUNTER — Telehealth: Payer: Self-pay | Admitting: *Deleted

## 2013-11-14 ENCOUNTER — Ambulatory Visit (INDEPENDENT_AMBULATORY_CARE_PROVIDER_SITE_OTHER): Payer: Medicare Other | Admitting: *Deleted

## 2013-11-14 ENCOUNTER — Other Ambulatory Visit: Payer: Self-pay | Admitting: Neurology

## 2013-11-14 DIAGNOSIS — E538 Deficiency of other specified B group vitamins: Secondary | ICD-10-CM | POA: Diagnosis not present

## 2013-11-14 DIAGNOSIS — R490 Dysphonia: Secondary | ICD-10-CM

## 2013-11-14 MED ORDER — CYANOCOBALAMIN 1000 MCG/ML IJ SOLN
1000.0000 ug | Freq: Once | INTRAMUSCULAR | Status: AC
  Administered 2013-11-14: 1000 ug via INTRAMUSCULAR

## 2013-11-14 NOTE — Telephone Encounter (Signed)
Pt here for her B 12 injection.  She relayed that her throat still causing her some issues.  (she is better in am then as day progresses notes more of the problem).  Questioning xeomin? The cause?    Why B 12 so low?  She feels more energetic since getting injections.  I told her that the B12 not being absorbed in stomach, so that's why low level and reason for injections.  I also relayed that memory / cognitive can be affected with low B 12.  She expressed that would like to speak to someone MD about these things.  I would relay to Dr. Hosie PoissonSumner.

## 2013-11-14 NOTE — Progress Notes (Signed)
Pt here for B 12 injection.  Under aseptic technique cyanocobalamin 1000mcg/1ml IM given L Deltoid.   Tolerated well.  Bandaid applied.  

## 2013-11-14 NOTE — Patient Instructions (Signed)
Pt to see next week.

## 2013-11-14 NOTE — Telephone Encounter (Signed)
Returned call. All questions answered.

## 2013-11-15 ENCOUNTER — Encounter: Payer: Self-pay | Admitting: Neurology

## 2013-11-16 ENCOUNTER — Other Ambulatory Visit: Payer: Self-pay | Admitting: Neurology

## 2013-11-16 ENCOUNTER — Ambulatory Visit: Payer: Medicare Other | Attending: Neurology

## 2013-11-16 DIAGNOSIS — R490 Dysphonia: Secondary | ICD-10-CM | POA: Diagnosis not present

## 2013-11-16 DIAGNOSIS — IMO0001 Reserved for inherently not codable concepts without codable children: Secondary | ICD-10-CM | POA: Insufficient documentation

## 2013-11-16 DIAGNOSIS — R488 Other symbolic dysfunctions: Secondary | ICD-10-CM | POA: Insufficient documentation

## 2013-11-16 DIAGNOSIS — D72819 Decreased white blood cell count, unspecified: Secondary | ICD-10-CM

## 2013-11-21 ENCOUNTER — Ambulatory Visit (INDEPENDENT_AMBULATORY_CARE_PROVIDER_SITE_OTHER): Payer: Medicare Other | Admitting: Neurology

## 2013-11-21 ENCOUNTER — Encounter: Payer: Self-pay | Admitting: Neurology

## 2013-11-21 ENCOUNTER — Other Ambulatory Visit (INDEPENDENT_AMBULATORY_CARE_PROVIDER_SITE_OTHER): Payer: Medicare Other

## 2013-11-21 DIAGNOSIS — E538 Deficiency of other specified B group vitamins: Secondary | ICD-10-CM | POA: Diagnosis not present

## 2013-11-21 DIAGNOSIS — Z0289 Encounter for other administrative examinations: Secondary | ICD-10-CM

## 2013-11-21 DIAGNOSIS — R490 Dysphonia: Secondary | ICD-10-CM | POA: Diagnosis not present

## 2013-11-21 DIAGNOSIS — D72819 Decreased white blood cell count, unspecified: Secondary | ICD-10-CM | POA: Diagnosis not present

## 2013-11-21 MED ORDER — CYANOCOBALAMIN 1000 MCG/ML IJ SOLN
1000.0000 ug | Freq: Once | INTRAMUSCULAR | Status: AC
  Administered 2013-11-21: 1000 ug via INTRAMUSCULAR

## 2013-11-21 NOTE — Progress Notes (Signed)
Patient here for B12 injection.  Under aseptic technique Cyanocobalamin given IM in right deltoid.  Tolerated well.  Band-Aid applied. 

## 2013-11-21 NOTE — Patient Instructions (Signed)
Patient will return next week for week 3/4 B12 injection.

## 2013-11-23 ENCOUNTER — Ambulatory Visit: Payer: Medicare Other

## 2013-11-23 LAB — CBC
HCT: 36.5 % (ref 34.0–46.6)
Hemoglobin: 12.4 g/dL (ref 11.1–15.9)
MCH: 35.6 pg — ABNORMAL HIGH (ref 26.6–33.0)
MCHC: 34 g/dL (ref 31.5–35.7)
MCV: 105 fL — AB (ref 79–97)
Platelets: 393 10*3/uL — ABNORMAL HIGH (ref 150–379)
RBC: 3.48 x10E6/uL — ABNORMAL LOW (ref 3.77–5.28)
RDW: 15.9 % — AB (ref 12.3–15.4)
WBC: 7.9 10*3/uL (ref 3.4–10.8)

## 2013-11-23 LAB — MYASTHENIA GRAVIS EVALUATION: ANTI-STRIATION ABS: NEGATIVE

## 2013-11-23 LAB — METHYLMALONIC ACID, SERUM: METHYLMALONIC ACID: 324 nmol/L (ref 0–378)

## 2013-11-23 NOTE — Progress Notes (Signed)
Quick Note:  Spoke with patient informed her of normal labs, patient expressed understanding no further questions or concerns. ______

## 2013-11-28 ENCOUNTER — Ambulatory Visit (INDEPENDENT_AMBULATORY_CARE_PROVIDER_SITE_OTHER): Payer: Medicare Other | Admitting: *Deleted

## 2013-11-28 DIAGNOSIS — E538 Deficiency of other specified B group vitamins: Secondary | ICD-10-CM

## 2013-11-28 MED ORDER — CYANOCOBALAMIN 1000 MCG/ML IJ SOLN
1000.0000 ug | Freq: Once | INTRAMUSCULAR | Status: AC
  Administered 2013-11-28: 1000 ug via INTRAMUSCULAR

## 2013-11-28 NOTE — Patient Instructions (Signed)
See next wk for # 4 then monthly thereafter.

## 2013-11-28 NOTE — Progress Notes (Signed)
Pt here for B 12 injection.  Under aseptic technique cyanocobalamin 1012mcg/1ml IM given R gluteal.  Tolerated well.  Bandaid applied.  Pt stated she is feeling much better more energetic.

## 2013-11-29 DIAGNOSIS — M999 Biomechanical lesion, unspecified: Secondary | ICD-10-CM | POA: Diagnosis not present

## 2013-11-29 DIAGNOSIS — M545 Low back pain, unspecified: Secondary | ICD-10-CM | POA: Diagnosis not present

## 2013-11-29 DIAGNOSIS — M542 Cervicalgia: Secondary | ICD-10-CM | POA: Diagnosis not present

## 2013-11-29 DIAGNOSIS — M9981 Other biomechanical lesions of cervical region: Secondary | ICD-10-CM | POA: Diagnosis not present

## 2013-11-29 DIAGNOSIS — M546 Pain in thoracic spine: Secondary | ICD-10-CM | POA: Diagnosis not present

## 2013-12-07 ENCOUNTER — Ambulatory Visit (INDEPENDENT_AMBULATORY_CARE_PROVIDER_SITE_OTHER): Payer: Medicare Other | Admitting: Neurology

## 2013-12-07 DIAGNOSIS — E538 Deficiency of other specified B group vitamins: Secondary | ICD-10-CM

## 2013-12-07 MED ORDER — CYANOCOBALAMIN 1000 MCG/ML IJ SOLN
1000.0000 ug | INTRAMUSCULAR | Status: DC
  Administered 2013-12-07 – 2014-03-13 (×4): 1000 ug via INTRAMUSCULAR

## 2013-12-07 NOTE — Progress Notes (Signed)
Patient here for B12 injection.  Under aseptic technique Cyanocobalamin 1000mcg/1ml given IM in right gluteal.  Tolerated well.  Band-Aid applied. 

## 2013-12-07 NOTE — Patient Instructions (Signed)
Patient will return next month for B12 injection. 

## 2013-12-13 DIAGNOSIS — R197 Diarrhea, unspecified: Secondary | ICD-10-CM | POA: Diagnosis not present

## 2013-12-13 DIAGNOSIS — E782 Mixed hyperlipidemia: Secondary | ICD-10-CM | POA: Diagnosis not present

## 2013-12-13 DIAGNOSIS — Z23 Encounter for immunization: Secondary | ICD-10-CM | POA: Diagnosis not present

## 2013-12-13 DIAGNOSIS — Z79899 Other long term (current) drug therapy: Secondary | ICD-10-CM | POA: Diagnosis not present

## 2013-12-13 DIAGNOSIS — E119 Type 2 diabetes mellitus without complications: Secondary | ICD-10-CM | POA: Diagnosis not present

## 2013-12-13 DIAGNOSIS — M25569 Pain in unspecified knee: Secondary | ICD-10-CM | POA: Diagnosis not present

## 2013-12-13 DIAGNOSIS — I10 Essential (primary) hypertension: Secondary | ICD-10-CM | POA: Diagnosis not present

## 2013-12-26 ENCOUNTER — Encounter: Payer: Self-pay | Admitting: Neurology

## 2013-12-26 ENCOUNTER — Other Ambulatory Visit: Payer: Self-pay | Admitting: Neurology

## 2013-12-26 DIAGNOSIS — E538 Deficiency of other specified B group vitamins: Secondary | ICD-10-CM

## 2014-01-02 ENCOUNTER — Other Ambulatory Visit (INDEPENDENT_AMBULATORY_CARE_PROVIDER_SITE_OTHER): Payer: Medicare Other

## 2014-01-02 ENCOUNTER — Other Ambulatory Visit: Payer: Self-pay | Admitting: *Deleted

## 2014-01-02 ENCOUNTER — Ambulatory Visit (INDEPENDENT_AMBULATORY_CARE_PROVIDER_SITE_OTHER): Payer: Medicare Other | Admitting: Neurology

## 2014-01-02 DIAGNOSIS — E538 Deficiency of other specified B group vitamins: Secondary | ICD-10-CM

## 2014-01-02 DIAGNOSIS — Z0289 Encounter for other administrative examinations: Secondary | ICD-10-CM

## 2014-01-02 NOTE — Progress Notes (Signed)
Patient here for B12 injection.  Under aseptic technique Cyanocobalamin given IM in right gluteal.  Tolerated well.  Band-Aid applied.

## 2014-01-02 NOTE — Patient Instructions (Signed)
Patient will return next month for B12 injection. 

## 2014-01-02 NOTE — Addendum Note (Signed)
Addended byHermenia Fiscal: Gus Littler on: 01/02/2014 09:50 AM   Modules accepted: Orders

## 2014-01-03 LAB — INTRINSIC FACTOR BLOCKING ANTIBODY: INTRINSIC FACTOR ABS, SERUM: 1 [AU]/ml (ref 0.0–1.1)

## 2014-01-11 ENCOUNTER — Telehealth: Payer: Self-pay | Admitting: *Deleted

## 2014-01-11 NOTE — Telephone Encounter (Signed)
Message copied by Ardeth SportsmanHODES, Rivkah Wolz L on Fri Jan 11, 2014  1:42 PM ------      Message from: Ramond MarrowSUMNER, PETER J      Created: Tue Jan 08, 2014 12:53 PM       Please let her know her blood work was normal. Thanks. ------

## 2014-01-11 NOTE — Telephone Encounter (Signed)
Spoke to patient and she is aware of normal lab results. Patient verbalizes understanding.

## 2014-01-14 ENCOUNTER — Other Ambulatory Visit: Payer: Self-pay

## 2014-01-14 DIAGNOSIS — L570 Actinic keratosis: Secondary | ICD-10-CM | POA: Diagnosis not present

## 2014-01-14 DIAGNOSIS — Z1231 Encounter for screening mammogram for malignant neoplasm of breast: Secondary | ICD-10-CM

## 2014-01-16 DIAGNOSIS — R471 Dysarthria and anarthria: Secondary | ICD-10-CM | POA: Diagnosis not present

## 2014-01-21 DIAGNOSIS — R471 Dysarthria and anarthria: Secondary | ICD-10-CM | POA: Diagnosis not present

## 2014-01-23 DIAGNOSIS — R471 Dysarthria and anarthria: Secondary | ICD-10-CM | POA: Diagnosis not present

## 2014-01-28 DIAGNOSIS — R471 Dysarthria and anarthria: Secondary | ICD-10-CM | POA: Diagnosis not present

## 2014-01-30 DIAGNOSIS — R471 Dysarthria and anarthria: Secondary | ICD-10-CM | POA: Diagnosis not present

## 2014-02-04 DIAGNOSIS — R471 Dysarthria and anarthria: Secondary | ICD-10-CM | POA: Diagnosis not present

## 2014-02-06 ENCOUNTER — Ambulatory Visit: Payer: Medicare Other

## 2014-02-13 ENCOUNTER — Ambulatory Visit (INDEPENDENT_AMBULATORY_CARE_PROVIDER_SITE_OTHER): Payer: Medicare Other | Admitting: *Deleted

## 2014-02-13 DIAGNOSIS — E538 Deficiency of other specified B group vitamins: Secondary | ICD-10-CM

## 2014-02-13 NOTE — Progress Notes (Signed)
Patient here for B 12 injection, under aseptic technique Cyancobalamin 1000 mcg/ 1 mL injected into Right gluteal at patient requested site, band aid applied, patient tolerated well.

## 2014-02-13 NOTE — Patient Instructions (Signed)
Patient will return in 1 month for next injection. 

## 2014-02-20 ENCOUNTER — Ambulatory Visit
Admission: RE | Admit: 2014-02-20 | Discharge: 2014-02-20 | Disposition: A | Discharge status: Home / Self Care | Payer: Medicare Other | Source: Ambulatory Visit

## 2014-02-20 DIAGNOSIS — Z1231 Encounter for screening mammogram for malignant neoplasm of breast: Secondary | ICD-10-CM

## 2014-03-13 ENCOUNTER — Encounter: Payer: Self-pay | Admitting: Neurology

## 2014-03-13 ENCOUNTER — Ambulatory Visit (INDEPENDENT_AMBULATORY_CARE_PROVIDER_SITE_OTHER): Payer: Medicare Other | Admitting: Neurology

## 2014-03-13 ENCOUNTER — Ambulatory Visit (INDEPENDENT_AMBULATORY_CARE_PROVIDER_SITE_OTHER): Payer: Medicare Other | Admitting: *Deleted

## 2014-03-13 VITALS — BP 162/82 | HR 91

## 2014-03-13 DIAGNOSIS — G458 Other transient cerebral ischemic attacks and related syndromes: Secondary | ICD-10-CM

## 2014-03-13 DIAGNOSIS — G451 Carotid artery syndrome (hemispheric): Secondary | ICD-10-CM

## 2014-03-13 DIAGNOSIS — I2510 Atherosclerotic heart disease of native coronary artery without angina pectoris: Secondary | ICD-10-CM | POA: Diagnosis not present

## 2014-03-13 DIAGNOSIS — E538 Deficiency of other specified B group vitamins: Secondary | ICD-10-CM | POA: Diagnosis not present

## 2014-03-13 DIAGNOSIS — Z0289 Encounter for other administrative examinations: Secondary | ICD-10-CM

## 2014-03-13 NOTE — Progress Notes (Signed)
Provider:  Dr Hosie Poisson Referring Provider: Merlene Laughter, MD Primary Care Physician:  Ginette Otto, MD  CC:  Focal hand dystonia  HPI:  Tracy [name] is a 80 y.o. female here for follow up after having an episode of word finding difficulty. Two days ago noted severe pain in her chest and light headed. Yesterday started having sensation of a dull headache and noted new onset of blurry and double vision. Her housekeeper came and noted nonsensical speech. Denies any focal weakness or sensory loss. No facial droop. She knew what she wanted to say but couldn't get the correct words out. She notes taking her blood pressure after this event and it was in the systolic 180s. Notes she typically runs in the 130s to 170 systolic. She is currently taking a ASA  daily. Denies any similar episodes.   Notes history of HTN, DM.    History   Social History  . Marital Status: Married    Spouse Name: N/A    Number of Children: 2  . Years of Education: masters   Occupational History  . retired    Social History Main Topics  . Smoking status: Never Smoker   . Smokeless tobacco: Never Used  . Alcohol Use: No  . Drug Use: No  . Sexual Activity: Not on file   Other Topics Concern  . Not on file   Social History Narrative   Patient is widowed, has 2 children   Patient is right handed   Education level is Master's degree   Caffeine consumption is 2 cups daily    Family History  Problem Relation Age of Onset  . CAD Father   . Stroke Other   . Liver cancer Other     Past Medical History  Diagnosis Date  . Diabetes mellitus   . Fibromyalgia   . Fibrocystic breast disease   . Hypertension   . GERD (gastroesophageal reflux disease)   . Carpal tunnel syndrome   . RBBB   . Mild cognitive impairment   . Focal dystonia   . CAD (coronary artery disease), native coronary artery   . Leukopenia     Past Surgical History  Procedure Laterality Date  . Appendectomy    .  Tonsillectomy    . Cardiac catheterization      normal    Current Outpatient Prescriptions  Medication Sig Dispense Refill  . ACCU-CHEK AVIVA PLUS test strip       . amLODipine (NORVASC) 5 MG tablet Take 5 mg by mouth daily.      Marland Kitchen bismuth subsalicylate (PEPTO BISMOL) 262 MG/15ML suspension Take 30 mLs by mouth every 6 (six) hours as needed for indigestion or diarrhea or loose stools.      . calcium-vitamin D (OSCAL WITH D) 500-200 MG-UNIT per tablet Take 1 tablet by mouth 3 (three) times daily.      . cetirizine (ZYRTEC) 10 MG tablet Take 5 mg by mouth daily.      . cholecalciferol (VITAMIN D) 1000 UNITS tablet Take 1,000 Units by mouth daily.      Marland Kitchen glipiZIDE (GLUCOTROL) 5 MG tablet Take 5 mg by mouth daily before breakfast. Taking 1/2 tablet 2 times daily      . glucosamine-chondroitin 500-400 MG tablet Take 1 tablet by mouth daily.      . hydrochlorothiazide (HYDRODIURIL) 12.5 MG tablet Take 12.5 mg by mouth daily.      Marland Kitchen KRILL OIL PO Take 1 capsule by mouth daily.      Marland Kitchen  metFORMIN (GLUCOPHAGE) 500 MG tablet Take 1 tablet (500 mg total) by mouth 2 (two) times daily with a meal. Do not restart Metformin until 7/12 am then continue prior home dosing of 1 tablet in am, 1 tablet at noon and 2 tablets in PM      . multivitamin-lutein (OCUVITE-LUTEIN) CAPS Take 1 capsule by mouth daily.       Current Facility-Administered Medications  Medication Dose Route Frequency Provider Last Rate Last Dose  . cyanocobalamin ((VITAMIN B-12)) injection 1,000 mcg  1,000 mcg Intramuscular Q30 days Omelia Blackwater, DO   1,000 mcg at 03/13/14 0948  . incobotulinumtoxinA (XEOMIN) 50 UNITS injection 50 Units  50 Units Intramuscular Once Omelia Blackwater, DO      . incobotulinumtoxinA [facility]) 50 UNITS injection 50 Units  50 Units Intramuscular Once Omelia Blackwater, DO        Allergies as of 03/13/2014 - Review Complete 03/13/2014  Allergen Reaction Noted  . Ace inhibitors Cough 12/31/2011  .  Codeine  12/31/2011  . Mold extract [trichophyton]  07/30/2013  . Novocain [procaine hcl]  12/31/2011  . Xylocaine jelly [lidocaine hcl]  07/30/2013    Vitals: BP 162/82  Pulse 91 Last Weight:  Wt Readings from Last 1 Encounters:  10/24/13 156 lb (70.761 kg)   Last Height:   Ht Readings from Last 1 Encounters:  10/24/13  (1.626 m)   Neurologic exam :  The patient is awake and alert, oriented to place and time. There is a normal attention span & concentration ability.  Speech is fluent without dysarthria, Mild dysphonia . Mood and affect are appropriate.  Cranial nerves:  Pupils are equal and briskly reactive to light. Extraocular movements in vertical and horizontal planes intact and without nystagmus. Visual fields by finger perimetry are intact.  Hearing to finger rub intact.  Facial sensation intact to fine touch. Facial motor strength is symmetric , face is slightly masked, and tongue and uvula move midline.  Motor exam: Normal tone and normal muscle bulk . She has no cog wheeling. She reports slower responses to repeated finger and hand activities in the left - Fine motor impairment with repetitive , fast movements is noted Sensory: Fine touch, pinprick and vibration were tested in all extremities - intact .   Coordination: . Finger-to-nose maneuver tested and normal without evidence of ataxia, dysmetria or tremor.  Gait and station: Patient walks without assistive device and is able to rise from the chair. Gait strength within normal limits. Decreased arm swing on the left Deep tendon reflexes: in the upper and lower extremities are symmetric and intact. There is no clonus    Assessment:  After physical and neurologic examination, review of laboratory studies, imaging, neurophysiology testing and pre-existing records, assessment will be reviewed on the problem list.  Plan:  Treatment plan and additional workup will be reviewed under Problem List.  1)Focal hand  dystonia 2)Speech difficulties: ? TIA 3)Cognitive decline  Tracy [name] is a 80 y.o. female here for follow up after having an episode concerning for a possible TIA. Besides for TIA the differential would also include hypertensive encephalopathy due to poorly controlled BP. Will do stroke workup and recheck B12 level. Counseled patient to follow up with Dr Pete Glatter to adjust BP medications. Continue on daily ASA.

## 2014-03-13 NOTE — Patient Instructions (Signed)
Patient will return in one month for next injection. 

## 2014-03-13 NOTE — Patient Instructions (Signed)
Overall you are doing fairly well but I do want to suggest a few things today:   Remember to drink plenty of fluid, eat healthy meals and do not skip any meals. Try to eat protein with a every meal and eat a healthy snack such as fruit or nuts in between meals. Try to keep a regular sleep-wake schedule and try to exercise daily, particularly in the form of walking, 20-30 minutes a day, if you can.   As far as diagnostic testing:  1)Please have some blood work drawn today 2)Please schedule a carotid ultrasound and echocardiogram of your heart 3)I would like you to have a MRI of your brain. You will be called to schedule this  Please continue aspirin  daily. Please follow up with Dr Pete Glatter to improve blood pressure control.   We will follow up once the workup is completed. Please call us with any interim questions, concerns, problems, updates or refill requests.   My clinical assistant and will answer any of your questions and relay your messages to me and also relay most of my messages to you.   Our phone number is [phone]. We also have an after hours call service for urgent matters and there is a physician on-call for urgent questions. For any emergencies you know to call 911 or go to the nearest emergency room

## 2014-03-13 NOTE — Progress Notes (Signed)
Patient here for B 12 injection, under aseptic technique Cyancobalamin 1000 mcg/ 1 mL injected into Right gluteal at patient requested site, band aid applied, patient tolerated well.

## 2014-03-14 ENCOUNTER — Other Ambulatory Visit (INDEPENDENT_AMBULATORY_CARE_PROVIDER_SITE_OTHER): Payer: Self-pay

## 2014-03-14 DIAGNOSIS — G458 Other transient cerebral ischemic attacks and related syndromes: Secondary | ICD-10-CM | POA: Diagnosis not present

## 2014-03-14 DIAGNOSIS — G451 Carotid artery syndrome (hemispheric): Secondary | ICD-10-CM | POA: Insufficient documentation

## 2014-03-14 DIAGNOSIS — E538 Deficiency of other specified B group vitamins: Secondary | ICD-10-CM | POA: Diagnosis not present

## 2014-03-14 DIAGNOSIS — Z0289 Encounter for other administrative examinations: Secondary | ICD-10-CM

## 2014-03-19 LAB — VITAMIN B12: Vitamin B-12: >1999 pg/mL — ABNORMAL HIGH (ref 211–946)

## 2014-03-19 LAB — LIPID PANEL
Chol/HDL Ratio: 3.4 ratio units (ref 0.0–4.4)
Cholesterol, Total: 158 mg/dL (ref 100–199)
HDL: 46 mg/dL (ref >39)
LDL CALC: 79 mg/dL (ref 0–99)
Triglycerides: 166 mg/dL — ABNORMAL HIGH (ref 0–149)
VLDL Cholesterol Cal: 33 mg/dL (ref 5–40)

## 2014-03-19 LAB — METHYLMALONIC ACID, SERUM: Methylmalonic Acid: 160 nmol/L (ref 0–378)

## 2014-03-20 DIAGNOSIS — I10 Essential (primary) hypertension: Secondary | ICD-10-CM | POA: Diagnosis not present

## 2014-03-20 DIAGNOSIS — E119 Type 2 diabetes mellitus without complications: Secondary | ICD-10-CM | POA: Diagnosis not present

## 2014-03-20 DIAGNOSIS — G459 Transient cerebral ischemic attack, unspecified: Secondary | ICD-10-CM | POA: Diagnosis not present

## 2014-03-27 ENCOUNTER — Ambulatory Visit
Admission: RE | Admit: 2014-03-27 | Discharge: 2014-03-27 | Disposition: A | Discharge status: Home / Self Care | Payer: Medicare Other | Source: Ambulatory Visit | Attending: Neurology | Admitting: Neurology

## 2014-03-27 ENCOUNTER — Ambulatory Visit (INDEPENDENT_AMBULATORY_CARE_PROVIDER_SITE_OTHER): Payer: Medicare Other

## 2014-03-27 DIAGNOSIS — E538 Deficiency of other specified B group vitamins: Secondary | ICD-10-CM

## 2014-03-27 DIAGNOSIS — G458 Other transient cerebral ischemic attacks and related syndromes: Secondary | ICD-10-CM | POA: Diagnosis not present

## 2014-03-27 DIAGNOSIS — G451 Carotid artery syndrome (hemispheric): Secondary | ICD-10-CM

## 2014-03-29 ENCOUNTER — Encounter: Payer: Self-pay | Admitting: Neurology

## 2014-03-29 ENCOUNTER — Telehealth: Payer: Self-pay | Admitting: Neurology

## 2014-03-29 NOTE — Telephone Encounter (Signed)
I called patient. The carotid Doppler study is unremarkable, no significant stenosis is seen. MRI results are unchanged from 18 months ago. The patient is to remain on aspirin at this time. A 2-D echocardiogram is pending.   MRI brain 03/28/2014:  IMPRESSION:  Abnormal MRI brain (without) demonstrating:  1. Mild scattered periventricular and subcortical chronic small vessel ischemic disease.  2. No acute findings. No significant change from MRI on 09/20/12.

## 2014-04-10 ENCOUNTER — Ambulatory Visit: Payer: Medicare Other

## 2014-04-24 ENCOUNTER — Encounter: Payer: Self-pay | Admitting: Neurology

## 2014-04-24 ENCOUNTER — Ambulatory Visit (INDEPENDENT_AMBULATORY_CARE_PROVIDER_SITE_OTHER): Payer: Medicare Other | Admitting: Neurology

## 2014-04-24 VITALS — BP 168/84 | HR 84 | Ht 64.0 in | Wt 163.4 lb

## 2014-04-24 DIAGNOSIS — I2510 Atherosclerotic heart disease of native coronary artery without angina pectoris: Secondary | ICD-10-CM

## 2014-04-24 DIAGNOSIS — G467 Other lacunar syndromes: Secondary | ICD-10-CM

## 2014-04-24 MED ORDER — TIZANIDINE HCL 2 MG PO TABS: ORAL_TABLET | ORAL | Status: DC

## 2014-04-24 NOTE — Patient Instructions (Signed)
Dystonias The dystonias are movement disorders in which sustained muscle contractions cause twisting and repetitive movements or abnormal postures. The movements, which are involuntary and sometimes painful, may affect a single muscle; a group of muscles such as those in the arms, legs, or neck; or the entire body. Early symptoms (problems) may include a deterioration in handwriting after writing several lines, foot cramps, and a tendency of one foot to pull up or drag after running or walking some distance. Other possible symptoms are tremor and voice or speech difficulties. Birth injury (particularly due to lack of oxygen), certain infections, reactions to certain drugs, heavy-metal or carbon monoxide poisoning, trauma (damage caused by an accident), or stroke can cause dystonic symptoms. About half the cases of dystonia have no connection to disease or injury and are called primary or idiopathic dystonia. Of the primary dystonias, many cases appear to be inherited in a dominant manner. Dystonias can also be symptoms of other diseases, some of which may be hereditary (passed down from parents). In some individuals, symptoms of a dystonia appear spontaneously in childhood between the ages of 5 and 16, usually in the foot or in the hand. For other individuals, the symptoms emerge in late adolescence or early adulthood. TREATMENT  No one treatment has been found universally effective for dystonia. Instead, physicians use a variety of therapies (medications, surgery and other treatments such as physical therapy, splinting, stress management, and biofeedback), aimed at reducing or eliminating muscle spasms and pain. Since response to drugs varies among patients and even in the same person over time, the therapy must be individualized. PROGNOSIS The initial symptoms can be very mild and may be noticeable only after prolonged exertion, stress, or fatigue. Over a period of time, the symptoms may become more  noticeable and widespread and be unrelenting; sometimes, however, there is little or no progression. RESEARCH BEING DONE Investigators believe that the dystonias result from an abnormality in an area of the brain called the basal ganglia, where some of the messages that initiate muscle contractions are processed. Scientists suspect a defect in the body's ability to process a group of chemicals called neurotransmitters that help cells in the brain communicate with each other. Scientists at the [facility] laboratories have conducted detailed investigations of the pattern of muscle activity in persons with dystonias. Studies using EEG analysis and neuroimaging are probing brain activity. The search for the gene or genes responsible for some forms of dominantly inherited dystonias continues. In 1989, a team of researchers mapped a gene for early-onset torsion dystonia to chromosome 9; the gene was subsequently named DYT1. In 1997, the team sequenced the DYT1 gene and found that it codes for a previously unknown protein now called "torsin A." Document Released: 06/04/2002 Document Revised: 09/06/2011 Document Reviewed: 08/08/2013 [employer] Patient Information 2015 [employer]. This information is not intended to replace advice given to you by your health care provider. Make sure you discuss any questions you have with your health care provider.  

## 2014-04-24 NOTE — Progress Notes (Signed)
Reason for visit: Left arm dysfunction  Brion Alimentancy P Dipalma is an 80 y.o. female  History of present illness:  [name] is a 80 year old right-handed white female with a history of problems with use of her left arm and hand that began in 2012. The patient indicates that the onset of the deficit was gradual in nature, but she does not believe that the problem has been progressive over the last 2 years. She reports stiffness of the left arm, clumsiness, and difficulty using the arm in all tasks. The patient does have some neck discomfort, but she denies any pain down the left arm. The patient has also reported some difficulty with generating speech, and she feels that she has to strain in order to talk. The patient has not had any similar problems with stiffness of the left leg. She denies any balance problems or difficulty with falls. The patient was found to have a very low vitamin B12 level, but replacement of the B12 did not improve her symptoms. MRI of the brain was done recently, as she had an event of transient aphasia and headache that may have been a migraine event. The patient does have small vessel disease that appears to be stable. The patient is on low-dose aspirin. She has undergone Botox injections of the left arm that did not improve her symptoms. The patient has had some fatigue issues. She has been tried on Sinemet in the past without benefit. The patient comes to this office for an evaluation.  Past Medical History  Diagnosis Date  . Diabetes mellitus   . Fibromyalgia   . Fibrocystic breast disease   . Hypertension   . GERD (gastroesophageal reflux disease)   . Carpal tunnel syndrome   . RBBB   . Mild cognitive impairment   . Focal dystonia   . CAD (coronary artery disease), native coronary artery   . Leukopenia   . Vitamin B12 deficiency     Past Surgical History  Procedure Laterality Date  . Appendectomy    . Tonsillectomy    . Cardiac catheterization      normal     Family History  Problem Relation Age of Onset  . CAD Father   . Stroke Other   . Liver cancer Other     Social history:  reports that she has never smoked. She has never used smokeless tobacco. She reports that she does not drink alcohol or use illicit drugs.    Allergies  Allergen Reactions  . Ace Inhibitors Cough  . Codeine   . Mold Extract [Trichophyton]   . Novocain [Procaine Hcl]   . Xylocaine Jelly [Lidocaine Hcl]     Medications:  Current Outpatient Prescriptions on File Prior to Visit  Medication Sig Dispense Refill  . ACCU-CHEK AVIVA PLUS test strip       . amLODipine (NORVASC) 5 MG tablet Take 5 mg by mouth daily.      Marland Kitchen. bismuth subsalicylate (PEPTO BISMOL) 262 MG/15ML suspension Take 30 mLs by mouth every 6 (six) hours as needed for indigestion or diarrhea or loose stools.      . calcium-vitamin D (OSCAL WITH D) 500-200 MG-UNIT per tablet Take 1 tablet by mouth 3 (three) times daily.      . cetirizine (ZYRTEC) 10 MG tablet Take 5 mg by mouth daily.      . cholecalciferol (VITAMIN D) 1000 UNITS tablet Take 1,000 Units by mouth daily.      Marland Kitchen. glipiZIDE (GLUCOTROL) 5 MG  tablet Take 5 mg by mouth daily before breakfast. Taking 1/2 tablet 2 times daily      . glucosamine-chondroitin 500-400 MG tablet Take 1 tablet by mouth daily.      . hydrochlorothiazide (HYDRODIURIL) 12.5 MG tablet Take 12.5 mg by mouth daily.      Marland Kitchen. KRILL OIL PO Take 1 capsule by mouth daily.      . metFORMIN (GLUCOPHAGE) 500 MG tablet Take 1 tablet (500 mg total) by mouth 2 (two) times daily with a meal. Do not restart Metformin until 7/12 am then continue prior home dosing of 1 tablet in am, 1 tablet at noon and 2 tablets in PM      . multivitamin-lutein (OCUVITE-LUTEIN) CAPS Take 1 capsule by mouth daily.       No current facility-administered medications on file prior to visit.    ROS:  Out of a complete 14 system review of symptoms, the patient complains only of the following symptoms, and  all other reviewed systems are negative.  Speech difficulty Muscle cramps of the legs, neck stiffness  Blood pressure 168/84, pulse 84, height 5\' 4"  (1.626 m), weight 163 lb 6.4 oz (74.118 kg).  Physical Exam  General: The patient is alert and cooperative at the time of the examination.  Skin: No significant peripheral edema is noted.   Neurologic Exam  Mental status: The patient is oriented x 3.  Cranial nerves: Facial symmetry is present. Speech is normal, no aphasia or dysarthria is noted. Extraocular movements are full, with exception that there is some prescription of superior gaze. Visual fields are full.  Motor: The patient has good strength in all 4 extremities. Increased motor tone is noted on the left arm.  Sensory examination: Soft touch sensation is symmetric on the face, arms, and legs.  Coordination: The patient has good finger-nose-finger and heel-to-shin bilaterally, with the exception that the patient has slowness of movement with the left arm, decreased rapid alternating movements with the left arm.  Gait and station: The patient has a normal gait. With ambulation, the patient has decreased arm swing on the left. Tandem gait is slightly unsteady. Romberg is negative. No drift is seen.  Reflexes: Deep tendon reflexes are symmetric, with exception that the left biceps reflex is elevated..   Assessment/Plan:  1. Left arm dysfunction  2. Vitamin B12 deficiency  The patient has an unusual syndrome of some reports of speech difficulty, and prominent stiffness and increased motor tone of the left arm associated with hyperreflexia of the left arm. The patient does have small vessel disease on the MRI of the brain, but this has been stable over time. The patient has not improved on the B12 supplementation. The patient does have superior gaze paresis. The etiology of her current symptoms is not clear, but the patient does not believe that there has been any progression.  Botox injections have not been helpful. The patient should be considered for entities such as cortical basilar degeneration. The patient will be set up for MRI evaluation of the cervical spine. If this is unremarkable, the patient will be sent for a second opinion at [id number]. The patient will be placed on tizanidine for what appears to be spasticity of the left arm. The patient does not appear to have true apraxia of the left arm. She will follow-up in 3-4 months.  Marlan Palau. Keith [name] MD 04/24/2014 7:12 PM  [facility] [address] [place], [place] [id number]  Phone [phone] Fax [phone]

## 2014-04-26 ENCOUNTER — Encounter: Payer: Self-pay | Admitting: Neurology

## 2014-05-08 ENCOUNTER — Ambulatory Visit
Admission: RE | Admit: 2014-05-08 | Discharge: 2014-05-08 | Disposition: A | Discharge status: Home / Self Care | Payer: Medicare Other | Source: Ambulatory Visit | Attending: Neurology | Admitting: Neurology

## 2014-05-08 DIAGNOSIS — G467 Other lacunar syndromes: Secondary | ICD-10-CM

## 2014-05-14 ENCOUNTER — Telehealth: Payer: Self-pay | Admitting: Neurology

## 2014-05-14 ENCOUNTER — Encounter: Payer: Self-pay | Admitting: Neurology

## 2014-05-14 MED ORDER — TIZANIDINE HCL 2 MG PO TABS: ORAL_TABLET | ORAL | Status: DC

## 2014-05-14 NOTE — Telephone Encounter (Signed)
  I called the patient. MRI scan of the cervical spine does not show findings that explain her left arm symptoms. The patient has a very slowly progressive issue with spasticity that is involving the left arm, and now the voice. We need to consider the possibility of primary lateral sclerosis, or possible ALS. We'll need to follow patient over time. May consider a referral for second opinion.  MRI cervical spine 05/14/2014:  IMPRESSION: Abnormal MRI scan of cervical spine showing mild spondylitic changes at C5-6 with lateral osteophyte bulges with mild biforaminal narrowing but without significant compression

## 2014-05-15 ENCOUNTER — Encounter: Payer: Self-pay | Admitting: Neurology

## 2014-05-15 ENCOUNTER — Other Ambulatory Visit: Payer: Self-pay | Admitting: Neurology

## 2014-05-15 MED ORDER — TIZANIDINE HCL 2 MG PO TABS: ORAL_TABLET | ORAL | Status: DC

## 2014-05-16 ENCOUNTER — Encounter: Payer: Self-pay | Admitting: *Deleted

## 2014-06-02 ENCOUNTER — Encounter (HOSPITAL_COMMUNITY): Payer: Self-pay | Admitting: *Deleted

## 2014-06-02 ENCOUNTER — Encounter: Payer: Self-pay | Admitting: Neurology

## 2014-06-02 ENCOUNTER — Emergency Department (HOSPITAL_COMMUNITY)
Admission: EM | Admit: 2014-06-02 | Discharge: 2014-06-02 | Disposition: A | Discharge status: Home / Self Care | Payer: Medicare Other | Attending: Emergency Medicine | Admitting: Emergency Medicine

## 2014-06-02 ENCOUNTER — Emergency Department (HOSPITAL_COMMUNITY): Payer: Medicare Other

## 2014-06-02 DIAGNOSIS — Z9889 Other specified postprocedural states: Secondary | ICD-10-CM | POA: Insufficient documentation

## 2014-06-02 DIAGNOSIS — W19XXXA Unspecified fall, initial encounter: Secondary | ICD-10-CM

## 2014-06-02 DIAGNOSIS — E538 Deficiency of other specified B group vitamins: Secondary | ICD-10-CM | POA: Insufficient documentation

## 2014-06-02 DIAGNOSIS — S01512A Laceration without foreign body of oral cavity, initial encounter: Secondary | ICD-10-CM | POA: Diagnosis not present

## 2014-06-02 DIAGNOSIS — Z79899 Other long term (current) drug therapy: Secondary | ICD-10-CM | POA: Insufficient documentation

## 2014-06-02 DIAGNOSIS — S0993XA Unspecified injury of face, initial encounter: Secondary | ICD-10-CM | POA: Diagnosis not present

## 2014-06-02 DIAGNOSIS — Z8739 Personal history of other diseases of the musculoskeletal system and connective tissue: Secondary | ICD-10-CM | POA: Diagnosis not present

## 2014-06-02 DIAGNOSIS — Y9289 Other specified places as the place of occurrence of the external cause: Secondary | ICD-10-CM | POA: Diagnosis not present

## 2014-06-02 DIAGNOSIS — H1131 Conjunctival hemorrhage, right eye: Secondary | ICD-10-CM | POA: Diagnosis not present

## 2014-06-02 DIAGNOSIS — R51 Headache: Secondary | ICD-10-CM | POA: Diagnosis not present

## 2014-06-02 DIAGNOSIS — E119 Type 2 diabetes mellitus without complications: Secondary | ICD-10-CM | POA: Insufficient documentation

## 2014-06-02 DIAGNOSIS — Z8742 Personal history of other diseases of the female genital tract: Secondary | ICD-10-CM | POA: Diagnosis not present

## 2014-06-02 DIAGNOSIS — Y9389 Activity, other specified: Secondary | ICD-10-CM | POA: Insufficient documentation

## 2014-06-02 DIAGNOSIS — Z862 Personal history of diseases of the blood and blood-forming organs and certain disorders involving the immune mechanism: Secondary | ICD-10-CM | POA: Insufficient documentation

## 2014-06-02 DIAGNOSIS — Z7982 Long term (current) use of aspirin: Secondary | ICD-10-CM | POA: Insufficient documentation

## 2014-06-02 DIAGNOSIS — Z8719 Personal history of other diseases of the digestive system: Secondary | ICD-10-CM | POA: Insufficient documentation

## 2014-06-02 DIAGNOSIS — S0083XA Contusion of other part of head, initial encounter: Secondary | ICD-10-CM | POA: Insufficient documentation

## 2014-06-02 DIAGNOSIS — W06XXXA Fall from bed, initial encounter: Secondary | ICD-10-CM | POA: Diagnosis not present

## 2014-06-02 DIAGNOSIS — R030 Elevated blood-pressure reading, without diagnosis of hypertension: Secondary | ICD-10-CM | POA: Diagnosis not present

## 2014-06-02 DIAGNOSIS — R259 Unspecified abnormal involuntary movements: Secondary | ICD-10-CM | POA: Diagnosis not present

## 2014-06-02 DIAGNOSIS — S0990XA Unspecified injury of head, initial encounter: Secondary | ICD-10-CM | POA: Diagnosis not present

## 2014-06-02 DIAGNOSIS — Y998 Other external cause status: Secondary | ICD-10-CM | POA: Insufficient documentation

## 2014-06-02 DIAGNOSIS — I10 Essential (primary) hypertension: Secondary | ICD-10-CM | POA: Insufficient documentation

## 2014-06-02 DIAGNOSIS — I2510 Atherosclerotic heart disease of native coronary artery without angina pectoris: Secondary | ICD-10-CM | POA: Diagnosis not present

## 2014-06-02 DIAGNOSIS — R52 Pain, unspecified: Secondary | ICD-10-CM | POA: Diagnosis not present

## 2014-06-02 NOTE — Discharge Instructions (Signed)
Contusion A contusion is a deep bruise. Contusions are the result of an injury that caused bleeding under the skin. The contusion may turn blue, purple, or yellow. Minor injuries will give you a painless contusion, but more severe contusions may stay painful and swollen for a few weeks.  CAUSES  A contusion is usually caused by a blow, trauma, or direct force to an area of the body. SYMPTOMS   Swelling and redness of the injured area.  Bruising of the injured area.  Tenderness and soreness of the injured area.  Pain. DIAGNOSIS  The diagnosis can be made by taking a history and physical exam. An X-ray, CT scan, or MRI may be needed to determine if there were any associated injuries, such as fractures. TREATMENT  Specific treatment will depend on what area of the body was injured. In general, the best treatment for a contusion is resting, icing, elevating, and applying cold compresses to the injured area. Over-the-counter medicines may also be recommended for pain control. Ask your caregiver what the best treatment is for your contusion. HOME CARE INSTRUCTIONS   Put ice on the injured area.  Put ice in a plastic bag.  Place a towel between your skin and the bag.  Leave the ice on for 15-20 minutes, 3-4 times a day, or as directed by your health care provider.  Only take over-the-counter or prescription medicines for pain, discomfort, or fever as directed by your caregiver. Your caregiver may recommend avoiding anti-inflammatory medicines (aspirin, ibuprofen, and naproxen) for 48 hours because these medicines may increase bruising.  Rest the injured area.  If possible, elevate the injured area to reduce swelling. SEEK IMMEDIATE MEDICAL CARE IF:   You have increased bruising or swelling.  You have pain that is getting worse.  Your swelling or pain is not relieved with medicines. MAKE SURE YOU:   Understand these instructions.  Will watch your condition.  Will get help right  away if you are not doing well or get worse. Document Released: 03/24/2005 Document Revised: 06/19/2013 Document Reviewed: 04/19/2011 [facility] Patient Information 2015 [place], [place]. This information is not intended to replace advice given to you by your health care provider. Make sure you discuss any questions you have with your health care provider.  Mandibular Contusion A mandibular contusion is a deep bruise of your jaw. Contusions are the result of an injury that caused bleeding under the skin. The contusion may turn blue, purple, or yellow. Minor injuries will give you a painless contusion, but more severe contusions may stay painful and swollen for a few weeks.  CAUSES A mandibular contusion comes from a direct force to that area, such as falling or a punch to the jaw. SYMPTOMS   Jaw pain.  Jaw swelling.  Jaw bruising.  Jaw tenderness. DIAGNOSIS  The diagnosis can be made by taking your history and doing a physical exam. You may need an X-ray of your jaw to look for a broken bone (fracture). TREATMENT Often, the best treatment for a mandibular contusion is applying cold compresses to the injured area and eating a soft diet. Over-the-counter medicines may also be recommended for pain control.  HOME CARE INSTRUCTIONS   Put ice on the injured area.  Put ice in a plastic bag.  Place a towel between your skin and the bag.  Leave the ice on for 15-20 minutes, 03-04 times a day.  Eat soft foods for 1 week. Soft foods include baby food, gelatin, cooked cereal, ice cream, applesauce,  bananas, eggs, pasta, cottage cheese, soups, and yogurt. Cut food into smaller pieces for less chewing. Avoid chewing gum or ice.  Only take over-the-counter or prescription medicines for pain, discomfort, or fever as directed by your caregiver.  Avoid opening your mouth widely. This includes opening your mouth to eat large pieces of food or to yawn, scream, yell, or sing. SEEK IMMEDIATE MEDICAL CARE  IF:   Your swelling or pain is not relieved with medicines.  You are not improving.  You have any cracking or clicking (crepitation) in the jaw joint. MAKE SURE YOU:   Understand these instructions.  Will watch your condition.  Will get help right away if you are not doing well or get worse. Document Released: 09/04/2003 Document Revised: 09/06/2011 Document Reviewed: 04/30/2011 [employer] [facility] Patient Information 2015 [place], [place]. This information is not intended to replace advice given to you by your health care provider. Make sure you discuss any questions you have with your health care provider.  Mouth Laceration A mouth laceration is a cut inside the mouth. TREATMENT  Because of all the bacteria in the mouth, lacerations are usually not stitched (sutured) unless the wound is gaping open. Sometimes, a couple sutures may be placed just to hold the edges of the wound together and to speed healing. Over the next 1 to 2 days, you will see that the wound edges appear gray in color. The edges may appear ragged and slightly spread apart. Because of all the normal bacteria in the mouth, these wounds are contaminated, but this is not an infection that needs antibiotics. Most wounds heal with no problems despite their appearance. HOME CARE INSTRUCTIONS   Rinse your mouth with a warm, saltwater wash 4 to 6 times per day, or as your caregiver instructs.  Continue oral hygiene and gentle tooth brushing as normal, if possible.  Do not eat or drink hot food or beverages while your mouth is still numb.  Eat a bland diet to avoid irritation from acidic foods.  Only take over-the-counter or prescription medicines for pain, discomfort, or fever as directed by your caregiver.  Follow up with your caregiver as instructed. You may need to see your caregiver for a wound check in 48 to 72 hours to make sure your wound is healing.  If your laceration was sutured, do not play with the sutures or knots with  your tongue. If you do this, they will gradually loosen and may become untied. You may need a tetanus shot if:  You cannot remember when you had your last tetanus shot.  You have never had a tetanus shot. If you get a tetanus shot, your arm may swell, get red, and feel warm to the touch. This is common and not a problem. If you need a tetanus shot and you choose not to have one, there is a rare chance of getting tetanus. Sickness from tetanus can be serious. SEEK MEDICAL CARE IF:   You develop swelling or increasing pain in the wound or in other parts of your face.  You have a fever.  You develop swollen, tender glands in the throat.  You notice the wound edges do not stay together after your sutures have been removed.  You see pus coming from the wound. Some drainage in the mouth is normal. MAKE SURE YOU:   Understand these instructions.  Will watch your condition.  Will get help right away if you are not doing well or get worse. Document Released: 06/14/2005 Document Revised: 09/06/2011  Document Reviewed: 12/17/2010 [facility] Patient Information 2015 [place], [place]. This information is not intended to replace advice given to you by your health care provider. Make sure you discuss any questions you have with your health care provider.

## 2014-06-02 NOTE — ED Notes (Signed)
Bed: [id number] Expected date: 06/02/14 Expected time: 7:28 AM Means of arrival: Ambulance Comments: Fall

## 2014-06-02 NOTE — ED Notes (Signed)
Pt in from home. Larey SeatFell getting out of bed, sts she has "movement issues" and lost her balance standing up. Denies syncope. Hit her R jaw and face on nightstand. Bruising and swelling to R jaw. Busted blood vessel in R eye. Denies blurred vision, eye pain. Small 1cm laceration noted to inside of R lower lip, no bleeding.

## 2014-06-02 NOTE — ED Provider Notes (Signed)
CSN: [id number]     Arrival date & time 06/02/14  0746 History   First MD Initiated Contact with Patient 06/02/14 [phone]     Chief Complaint  Patient presents with  . Fall     (Consider location/radiation/quality/duration/timing/severity/associated sxs/prior Treatment) HPI Comments: Patient brought to the ER by ambulance after a fall. Patient reports that she lost her balance after she stumbled and fell. She did not pass out. No chest pain, shortness of breath, palpitations. Patient reports that she did hit the right side of her face on a nightstand. She is complaining of facial pain. She is concerned about redness and bleeding of the right eye, no vision change. No pain with movement of the eye.  Patient is a 80 y.o. female presenting with fall.  Fall    Past Medical History  Diagnosis Date  . Diabetes mellitus   . Fibromyalgia   . Fibrocystic breast disease   . Hypertension   . GERD (gastroesophageal reflux disease)   . Carpal tunnel syndrome   . RBBB   . Mild cognitive impairment   . Focal dystonia   . CAD (coronary artery disease), native coronary artery   . Leukopenia   . Vitamin B12 deficiency    Past Surgical History  Procedure Laterality Date  . Appendectomy    . Tonsillectomy    . Cardiac catheterization      normal   Family History  Problem Relation Age of Onset  . CAD Father   . Stroke Other   . Liver cancer Other    History  Substance Use Topics  . Smoking status: Never Smoker   . Smokeless tobacco: Never Used  . Alcohol Use: No   OB History    No data available     Review of Systems  HENT: Positive for facial swelling.   Musculoskeletal: Negative for back pain and neck pain.  All other systems reviewed and are negative.     Allergies  Mold extract; Ace inhibitors; Codeine; Darvon; Novocain; and Xylocaine jelly  Home Medications   Prior to Admission medications   Medication Sig Start Date End Date Taking? Authorizing Provider  amLODipine  (NORVASC) 5 MG tablet Take 5 mg by mouth daily.   Yes Historical Provider, MD  aspirin EC 81 MG tablet Take 81 mg by mouth daily.   Yes Historical Provider, MD  bismuth subsalicylate (PEPTO BISMOL) 262 MG/15ML suspension Take 30 mLs by mouth every 6 (six) hours as needed for indigestion or diarrhea or loose stools.   Yes Historical Provider, MD  calcium-vitamin D (OSCAL WITH D) 500-200 MG-UNIT per tablet Take 1 tablet by mouth 3 (three) times daily.   Yes Historical Provider, MD  cetirizine (ZYRTEC) 10 MG tablet Take 5 mg by mouth daily.   Yes Historical Provider, MD  cholecalciferol (VITAMIN D) 1000 UNITS tablet Take 1,000-2,000 Units by mouth daily.    Yes Historical Provider, MD  glipiZIDE (GLUCOTROL) 5 MG tablet Take 2.5 mg by mouth daily before breakfast. Taking 1/2 tablet 2 times daily   Yes Historical Provider, MD  glucosamine-chondroitin 500-400 MG tablet Take 1 tablet by mouth daily.   Yes Historical Provider, MD  hydrochlorothiazide (HYDRODIURIL) 12.5 MG tablet Take 12.5 mg by mouth daily.   Yes Historical Provider, MD  KRILL OIL PO Take 1 capsule by mouth daily.   Yes Historical Provider, MD  metFORMIN (GLUCOPHAGE) 500 MG tablet Take 1 tablet (500 mg total) by mouth 2 (two) times daily with a meal. Do not  restart Metformin until 7/12 am then continue prior home dosing of 1 tablet in am, 1 tablet at noon and 2 tablets in PM Patient taking differently: Take 500-1,000 mg by mouth 2 (two) times daily with a meal. Do not restart Metformin until 7/12 am then continue prior home dosing of 1 tablet in am, 1 tablet at noon and 2 tablets in PM 01/04/12  Yes Quintella Reichert, MD  multivitamin-lutein (OCUVITE-LUTEIN) CAPS Take 1 capsule by mouth daily.   Yes Historical Provider, MD  tiZANidine (ZANAFLEX) 2 MG tablet 1 tablet in the morning, 1 at midday, 2 in the evening Patient taking differently: Take 2-4 mg by mouth 3 (three) times daily. 1 tablet in the morning, 1 at midday, 2 in the evening 05/15/14   Yes York Spaniel, MD  vitamin B-12 (CYANOCOBALAMIN) 1000 MCG tablet Take 1,000 mcg by mouth daily.   Yes Historical Provider, MD   BP 175/82 mmHg  Pulse 98  Temp(Src) 97.9 F (36.6 C) (Oral)  Resp 18  SpO2 97% Physical Exam  Constitutional: She is oriented to person, place, and time. She appears well-developed and well-nourished. No distress.  HENT:  Head: Normocephalic. Head is with contusion.    Right Ear: Hearing normal.  Left Ear: Hearing normal.  Nose: Nose normal.  Mouth/Throat: Oropharynx is clear and moist and mucous membranes are normal. Normal dentition.    Eyes: EOM are normal. Pupils are equal, round, and reactive to light. Right conjunctiva has a hemorrhage.    Neck: Normal range of motion. Neck supple.  Cardiovascular: Regular rhythm, S1 normal and S2 normal.  Exam reveals no gallop and no friction rub.   No murmur heard. Pulmonary/Chest: Effort normal and breath sounds normal. No respiratory distress. She exhibits no tenderness.  Abdominal: Soft. Normal appearance and bowel sounds are normal. There is no hepatosplenomegaly. There is no tenderness. There is no rebound, no guarding, no tenderness at McBurney's point and negative Murphy's sign. No hernia.  Musculoskeletal: Normal range of motion.  Neurological: She is alert and oriented to person, place, and time. She has normal strength. No cranial nerve deficit or sensory deficit. Coordination normal. GCS eye subscore is 4. GCS verbal subscore is 5. GCS motor subscore is 6.  Skin: Skin is warm, dry and intact. Ecchymosis noted. No rash noted. No cyanosis.     Psychiatric: She has a normal mood and affect. Her speech is normal and behavior is normal. Thought content normal.  Nursing note and vitals reviewed.   ED Course  Procedures (including critical care time) Labs Review Labs Reviewed - No data to display  Imaging Review Ct Head Wo Contrast  06/02/2014   CLINICAL DATA:  Fall, facial trauma, blurred  vision, headache, injury  EXAM: CT HEAD WITHOUT CONTRAST  CT MAXILLOFACIAL WITHOUT CONTRAST  TECHNIQUE: Multidetector CT imaging of the head and maxillofacial structures were performed using the standard protocol without intravenous contrast. Multiplanar CT image reconstructions of the maxillofacial structures were also generated.  COMPARISON:  03/27/2014 brain MRI  FINDINGS: CT HEAD FINDINGS  Stable diffuse brain atrophy and chronic white matter microvascular ischemic changes throughout the cerebral hemispheres. No acute intracranial hemorrhage, mass lesion, definite infarction, midline shift, herniation, hydrocephalus, or extra-axial fluid collection. Cisterns patent. No cerebellar abnormality. Orbits are symmetric. Mastoids and sinuses remain clear. Skull appears intact.  CT MAXILLOFACIAL FINDINGS  Right mandibular soft tissue bruising/hematoma noted. Underlying mandible appears intact. No facial bony trauma or fracture. The mandible, maxilla, pterygoid plates, skullbase, zygomas, orbits, nasal  septum, and nasal bones all appear intact. No displaced fracture or acute osseous abnormality. Minor sinus mucosal thickening. No sinus hemorrhage or air-fluid level. Dental hardware creates streak artifact. Orbits are symmetric. No proptosis. No orbital of blowout fracture. Degenerative changes of the C1-2 articulation.  IMPRESSION: Stable brain atrophy and chronic white matter ischemic changes. No acute intracranial finding.  Right mandibular soft tissue bruising/hematoma without underlying facial fracture.   Electronically Signed   By: Ruel Favorsrevor  Shick M.D.   On: 06/02/2014 08:48   Ct Maxillofacial Wo Cm  06/02/2014   CLINICAL DATA:  Fall, facial trauma, blurred vision, headache, injury  EXAM: CT HEAD WITHOUT CONTRAST  CT MAXILLOFACIAL WITHOUT CONTRAST  TECHNIQUE: Multidetector CT imaging of the head and maxillofacial structures were performed using the standard protocol without intravenous contrast. Multiplanar CT  image reconstructions of the maxillofacial structures were also generated.  COMPARISON:  03/27/2014 brain MRI  FINDINGS: CT HEAD FINDINGS  Stable diffuse brain atrophy and chronic white matter microvascular ischemic changes throughout the cerebral hemispheres. No acute intracranial hemorrhage, mass lesion, definite infarction, midline shift, herniation, hydrocephalus, or extra-axial fluid collection. Cisterns patent. No cerebellar abnormality. Orbits are symmetric. Mastoids and sinuses remain clear. Skull appears intact.  CT MAXILLOFACIAL FINDINGS  Right mandibular soft tissue bruising/hematoma noted. Underlying mandible appears intact. No facial bony trauma or fracture. The mandible, maxilla, pterygoid plates, skullbase, zygomas, orbits, nasal septum, and nasal bones all appear intact. No displaced fracture or acute osseous abnormality. Minor sinus mucosal thickening. No sinus hemorrhage or air-fluid level. Dental hardware creates streak artifact. Orbits are symmetric. No proptosis. No orbital of blowout fracture. Degenerative changes of the C1-2 articulation.  IMPRESSION: Stable brain atrophy and chronic white matter ischemic changes. No acute intracranial finding.  Right mandibular soft tissue bruising/hematoma without underlying facial fracture.   Electronically Signed   By: Ruel Favorsrevor  Shick M.D.   On: 06/02/2014 08:48     EKG Interpretation None      MDM   Final diagnoses:  Fall   facial contusion Mouth laceration Subconjunctival hemorrhage  Patient presents to the ER after a fall. Patient had a mechanical fall and hit the right side of her face on a nightstand. She had evidence of subconjunctival hemorrhage of the right eye without any other evidence of orbital or eye injury. There was bruising over the anterior portion of the right mandible. CT scan of head and maxillofacial bones was performed. Chest soft tissue swelling noted on the skin, no other injuries noted. Patient did have a small  mucosal laceration of the lower lip. No repair necessary. Patient reassured, no further treatment necessary.    Gilda Creasehristopher J. Pollina, MD 06/02/14 [phone]

## 2014-06-02 NOTE — ED Notes (Signed)
Pt ambulatory multiple times to the bathroom with staff stand by assist. No complaints. Awaiting [facility] arrival.

## 2014-06-02 NOTE — ED Notes (Signed)
[facility] called for transport.  

## 2014-06-02 NOTE — ED Notes (Signed)
[facility] arrived. Necessary paperwork provided. Pt d/c via stretcher to home.

## 2014-06-03 ENCOUNTER — Other Ambulatory Visit: Payer: Self-pay | Admitting: Neurology

## 2014-06-03 DIAGNOSIS — F82 Specific developmental disorder of motor function: Secondary | ICD-10-CM

## 2014-06-27 ENCOUNTER — Other Ambulatory Visit: Payer: Self-pay | Admitting: Geriatric Medicine

## 2014-06-27 DIAGNOSIS — Z0000 Encounter for general adult medical examination without abnormal findings: Secondary | ICD-10-CM | POA: Diagnosis not present

## 2014-06-27 DIAGNOSIS — E119 Type 2 diabetes mellitus without complications: Secondary | ICD-10-CM | POA: Diagnosis not present

## 2014-06-27 DIAGNOSIS — I10 Essential (primary) hypertension: Secondary | ICD-10-CM | POA: Diagnosis not present

## 2014-06-27 DIAGNOSIS — Z1389 Encounter for screening for other disorder: Secondary | ICD-10-CM | POA: Diagnosis not present

## 2014-06-27 DIAGNOSIS — E041 Nontoxic single thyroid nodule: Secondary | ICD-10-CM

## 2014-06-27 DIAGNOSIS — Z79899 Other long term (current) drug therapy: Secondary | ICD-10-CM | POA: Diagnosis not present

## 2014-06-27 DIAGNOSIS — M79671 Pain in right foot: Secondary | ICD-10-CM | POA: Diagnosis not present

## 2014-06-27 DIAGNOSIS — K219 Gastro-esophageal reflux disease without esophagitis: Secondary | ICD-10-CM | POA: Diagnosis not present

## 2014-07-03 ENCOUNTER — Other Ambulatory Visit: Payer: Medicare Other

## 2014-07-08 ENCOUNTER — Other Ambulatory Visit: Payer: Self-pay | Admitting: Neurology

## 2014-07-17 ENCOUNTER — Ambulatory Visit
Admission: RE | Admit: 2014-07-17 | Discharge: 2014-07-17 | Disposition: A | Discharge status: Home / Self Care | Payer: Medicare Other | Source: Ambulatory Visit | Attending: Geriatric Medicine | Admitting: Geriatric Medicine

## 2014-07-17 DIAGNOSIS — E041 Nontoxic single thyroid nodule: Secondary | ICD-10-CM

## 2014-07-17 DIAGNOSIS — E042 Nontoxic multinodular goiter: Secondary | ICD-10-CM | POA: Diagnosis not present

## 2014-08-07 ENCOUNTER — Ambulatory Visit: Payer: Medicare Other | Admitting: Cardiology

## 2014-08-14 ENCOUNTER — Encounter: Payer: Self-pay | Admitting: Neurology

## 2014-08-16 ENCOUNTER — Ambulatory Visit (INDEPENDENT_AMBULATORY_CARE_PROVIDER_SITE_OTHER): Payer: Medicare Other | Admitting: Cardiology

## 2014-08-16 ENCOUNTER — Encounter: Payer: Self-pay | Admitting: Cardiology

## 2014-08-16 VITALS — BP 144/70 | HR 77 | Ht 64.0 in | Wt 164.0 lb

## 2014-08-16 DIAGNOSIS — E782 Mixed hyperlipidemia: Secondary | ICD-10-CM | POA: Diagnosis not present

## 2014-08-16 DIAGNOSIS — I452 Bifascicular block: Secondary | ICD-10-CM

## 2014-08-16 DIAGNOSIS — I10 Essential (primary) hypertension: Secondary | ICD-10-CM | POA: Diagnosis not present

## 2014-08-16 LAB — BASIC METABOLIC PANEL
BUN: 21 mg/dL (ref 6–23)
CO2: 30 mEq/L (ref 19–32)
Calcium: 9.5 mg/dL (ref 8.4–10.5)
Chloride: 101 mEq/L (ref 96–112)
Creatinine, Ser: 0.78 mg/dL (ref 0.40–1.20)
GFR: 75.74 mL/min (ref >60.00)
GLUCOSE: 183 mg/dL — AB (ref 70–99)
POTASSIUM: 3.6 meq/L (ref 3.5–5.1)
Sodium: 137 mEq/L (ref 135–145)

## 2014-08-16 NOTE — Progress Notes (Signed)
Cardiology Office Note   Date:  08/16/2014   ID:  Tracy [name], Tracy [name] 08-20-1933, MRN [id number]  PCP:  Ginette Otto, MD  Cardiologist:   Quintella Reichert, MD   Chief Complaint  Patient presents with  . Hypertension      History of Present Illness: Tracy [name] is a 81 y.o. female with a history of chronic RBBB, HTN and dyslipidemia. SHe is doing well. She denies any chest pain, SOB, DOE, LE edema, dizziness, palpitations or syncope.  She has been having problems with her balance and garbled speech and had an MRI which was unremarkable.  She is now going to [facility] to be evaluated for possible Parkinson's.      Past Medical History  Diagnosis Date  . Diabetes mellitus   . Fibromyalgia   . Fibrocystic breast disease   . Hypertension   . GERD (gastroesophageal reflux disease)   . Carpal tunnel syndrome   . RBBB   . Mild cognitive impairment   . Focal dystonia   . Leukopenia   . Vitamin B12 deficiency     Past Surgical History  Procedure Laterality Date  . Appendectomy    . Tonsillectomy    . Cardiac catheterization      normal     Current Outpatient Prescriptions  Medication Sig Dispense Refill  . ACCU-CHEK AVIVA PLUS test strip   3  . amLODipine (NORVASC) 10 MG tablet Take 10 mg by mouth daily. Pt states she takes 1/2 tablet by mouth daily  0  . aspirin EC 81 MG tablet Take 81 mg by mouth daily.    Marland Kitchen bismuth subsalicylate (PEPTO BISMOL) 262 MG/15ML suspension Take 30 mLs by mouth every 6 (six) hours as needed for indigestion or diarrhea or loose stools.    . calcium-vitamin D (OSCAL WITH D) 500-200 MG-UNIT per tablet Take 1 tablet by mouth 3 (three) times daily.    . cetirizine (ZYRTEC) 10 MG tablet Take 5 mg by mouth daily.    . cholecalciferol (VITAMIN D) 1000 UNITS tablet Take 1,000-2,000 Units by mouth daily.     Marland Kitchen glipiZIDE (GLUCOTROL) 5 MG tablet Take 2.5 mg by mouth daily before breakfast. Taking 1/2 tablet 2 times daily    .  glucosamine-chondroitin 500-400 MG tablet Take 1 tablet by mouth daily.    . hydrochlorothiazide (HYDRODIURIL) 12.5 MG tablet Take 12.5 mg by mouth daily.    Marland Kitchen KRILL OIL PO Take 1 capsule by mouth daily.    . metFORMIN (GLUCOPHAGE) 500 MG tablet Take 1 tablet (500 mg total) by mouth 2 (two) times daily with a meal. Do not restart Metformin until 7/12 am then continue prior home dosing of 1 tablet in am, 1 tablet at noon and 2 tablets in PM (Patient taking differently: Take 500-1,000 mg by mouth 3 (three) times daily with meals. 1 tablet in am, 1 tablet at noon and 2 tablets in PM)    . multivitamin-lutein (OCUVITE-LUTEIN) CAPS Take 1 capsule by mouth daily.    Marland Kitchen tiZANidine (ZANAFLEX) 2 MG tablet 1 tablet in the morning, 1 at midday, 2 in the evening 120 tablet 3  . vitamin B-12 (CYANOCOBALAMIN) 1000 MCG tablet Take 1,000 mcg by mouth daily.     No current facility-administered medications for this visit.    Allergies:   Mold extract; Ace inhibitors; Codeine; Darvon; Novocain; and Xylocaine jelly    Social History:  The patient  reports that she has never smoked. She has never  used smokeless tobacco. She reports that she does not drink alcohol or use illicit drugs.   Family History:  The patient's family history includes CAD in her father; Liver cancer in her other; Stroke in her other.    ROS:  Please see the history of present illness.   Otherwise, review of systems are positive for none.   All other systems are reviewed and negative.    PHYSICAL EXAM: VS:  BP 144/70 mmHg  Pulse 77  Ht 5\' 4"  (1.626 m)  Wt 164 lb (74.39 kg)  BMI 28.14 kg/m2 , BMI Body mass index is 28.14 kg/(m^2). GEN: Well nourished, well developed, in no acute distress HEENT: normal Neck: no JVD, carotid bruits, or masses Cardiac: RRR; no murmurs, rubs, or gallops.  trace edema  Respiratory:  clear to auscultation bilaterally, normal work of breathing GI: soft, nontender, nondistended, + BS MS: no deformity or  atrophy Skin: warm and dry, no rash Neuro:  Strength and sensation are intact Psych: euthymic mood, full affect   EKG:  EKG was ordered today showing NSR with RBBB    Recent Labs: 10/24/2013: TSH 1.290 11/21/2013: Hemoglobin 12.4; Platelets 393*    Lipid Panel    Component Value Date/Time   TRIG 166* 03/14/2014 0929   HDL 46 03/14/2014 0929   CHOLHDL 3.4 03/14/2014 0929   LDLCALC 79 03/14/2014 0929      Wt Readings from Last 3 Encounters:  08/16/14 164 lb (74.39 kg)  04/24/14 163 lb 6.4 oz (74.118 kg)  10/24/13 156 lb (70.761 kg)    ASSESSMENT AND PLAN:  1. Chronic RBBB with LAFB 2. HTN - controlled - continue amlodipine/HCT  - check BMET 3. Dyslipidemia - her LDL was 79 which is at goal   4.       Normal coronary arteries at cath    Current medicines are reviewed at length with the patient today.  The patient does not have concerns regarding medicines.  The following changes have been made:  no change  Labs/ tests ordered today include: BMET     Disposition:   FU with me in 1 year   Signed, Quintella ReichertURNER,TRACI R, MD  08/16/2014 1:53 PM    [facility] [address], [place], [place]  [address] Phone: [phone]; Fax: [phone]

## 2014-08-16 NOTE — Patient Instructions (Signed)
Your physician recommends that you have lab work TODAY (BMET).  Your physician wants you to follow-up in: 1 year with Dr. Turner. You will receive a reminder letter in the mail two months in advance. If you don't receive a letter, please call our office to schedule the follow-up appointment. 

## 2014-08-21 ENCOUNTER — Encounter: Payer: Self-pay | Admitting: Neurology

## 2014-08-21 ENCOUNTER — Ambulatory Visit (INDEPENDENT_AMBULATORY_CARE_PROVIDER_SITE_OTHER): Payer: Medicare Other | Admitting: Neurology

## 2014-08-21 VITALS — BP 170/82 | HR 90 | Ht 64.0 in | Wt 161.6 lb

## 2014-08-21 DIAGNOSIS — G467 Other lacunar syndromes: Secondary | ICD-10-CM

## 2014-08-21 NOTE — Progress Notes (Signed)
Reason for visit: Gait disorder  Tracy [name] is an 81 y.o. female  History of present illness:  Tracy [name] is a 81 year old right-handed white female with a progressive left arm spasticity issue. The patient has an unknown neurodegenerative process. She has developed superior gaze paresis, left arm spasticity, with some issues with the lower extremities as well. She has developed dysarthria, not dysphonia as would be typical for Parkinson's disease. She reports some issues with swallowing as well. She has fallen on occasion, she indicates that using a cane makes her more unsteady. She uses a walker in the evening hours to get to the bathroom and she indicates that her legs become stiff and she has problems with walking without assistance. She has continued note some gradual progression of her left arm problems. The patient initially was thought to have a dystonia, and she received Botox injections without much benefit. The patient was on Sinemet early on in the process without much benefit as well. She will be seen for second opinion [facility] in April 2016. The patient indicates that when she bends over, she may have spasm in her torso. The patient is on tizanidine, but she is only taking half tablets throughout the day. She returns for an evaluation.  Past Medical History  Diagnosis Date  . Diabetes mellitus   . Fibromyalgia   . Fibrocystic breast disease   . Hypertension   . GERD (gastroesophageal reflux disease)   . Carpal tunnel syndrome   . RBBB   . Mild cognitive impairment   . Focal dystonia   . Leukopenia   . Vitamin B12 deficiency     Past Surgical History  Procedure Laterality Date  . Appendectomy    . Tonsillectomy    . Cardiac catheterization      normal    Family History  Problem Relation Age of Onset  . CAD Father   . Stroke Other   . Liver cancer Other     Social history:  reports that she has never smoked. She has never used smokeless tobacco. She  reports that she does not drink alcohol or use illicit drugs.    Allergies  Allergen Reactions  . Mold Extract [Trichophyton] Anaphylaxis    sneezing  . Ace Inhibitors Cough  . Codeine Nausea And Vomiting  . Darvon [Propoxyphene] Nausea And Vomiting  . Novocain [Procaine Hcl] Swelling    BP drop  . Xylocaine Jelly [Lidocaine Hcl]     unknown    Medications:  Current Outpatient Prescriptions on File Prior to Visit  Medication Sig Dispense Refill  . ACCU-CHEK AVIVA PLUS test strip   3  . amLODipine (NORVASC) 10 MG tablet Take 10 mg by mouth daily. Pt states she takes 1/2 tablet by mouth daily  0  . aspirin EC 81 MG tablet Take 81 mg by mouth daily.    Marland Kitchen. bismuth subsalicylate (PEPTO BISMOL) 262 MG/15ML suspension Take 30 mLs by mouth every 6 (six) hours as needed for indigestion or diarrhea or loose stools.    . calcium-vitamin D (OSCAL WITH D) 500-200 MG-UNIT per tablet Take 1 tablet by mouth 3 (three) times daily.    . cetirizine (ZYRTEC) 10 MG tablet Take 5 mg by mouth daily.    . cholecalciferol (VITAMIN D) 1000 UNITS tablet Take 1,000-2,000 Units by mouth daily.     Marland Kitchen. glipiZIDE (GLUCOTROL) 5 MG tablet Take 2.5 mg by mouth daily before breakfast. Taking 1/2 tablet 2 times daily    .  glucosamine-chondroitin 500-400 MG tablet Take 1 tablet by mouth daily.    . hydrochlorothiazide (HYDRODIURIL) 12.5 MG tablet Take 12.5 mg by mouth daily.    Marland Kitchen KRILL OIL PO Take 1 capsule by mouth daily.    . metFORMIN (GLUCOPHAGE) 500 MG tablet Take 1 tablet (500 mg total) by mouth 2 (two) times daily with a meal. Do not restart Metformin until 7/12 am then continue prior home dosing of 1 tablet in am, 1 tablet at noon and 2 tablets in PM (Patient taking differently: Take 500-1,000 mg by mouth 3 (three) times daily with meals. 1 tablet in am, 1 tablet at noon and 2 tablets in PM)    . multivitamin-lutein (OCUVITE-LUTEIN) CAPS Take 1 capsule by mouth daily.    Marland Kitchen tiZANidine (ZANAFLEX) 2 MG tablet 1 tablet  in the morning, 1 at midday, 2 in the evening 120 tablet 3  . vitamin B-12 (CYANOCOBALAMIN) 1000 MCG tablet Take 1,000 mcg by mouth daily.     No current facility-administered medications on file prior to visit.    ROS:  Out of a complete 14 system review of symptoms, the patient complains only of the following symptoms, and all other reviewed systems are negative.  Diarrhea Frequency of urination Muscle cramps, walking difficulty, neck pain, neck stiffness Speech difficulties  Blood pressure 170/82, pulse 90, height  (1.626 m), weight 161 lb 9.6 oz (73.301 kg).  Physical Exam  General: The patient is alert and cooperative at the time of the examination.  Skin: No significant peripheral edema is noted.   Neurologic Exam  Mental status: The patient is oriented x 3.  Cranial nerves: Facial symmetry is present. Speech is slow, deliberate, occasional dysarthria is noted. Extraocular movements are full with the exception of prominent superior gaze paresis. Visual fields are full.  Motor: The patient has good strength in all 4 extremities, but there is significant increased motor tone in the left upper extremity.  Sensory examination: Soft touch sensation is symmetric on the face, arms, and legs.  Coordination: The patient has good finger-nose-finger and heel-to-shin bilaterally, with exception that the patient has difficulty performing finger-nose-finger with the left arm.  Gait and station: The patient has a relatively good stride with walking, the left arm is held in flexion, normal arm swing on the right. The patient was unable to perform tandem gait. Romberg is negative. No drift is seen.  Reflexes: Deep tendon reflexes are symmetric.   Assessment/Plan:  1. Progressive left upper extremity spasticity  The patient has a clinical syndrome associated with superior gaze paresis, left arm spasticity, mild dysarthria and dysphagia, and a developing gait disorder. The  presentation is very atypical for Parkinson's disease. The patient will be sent for a second opinion through [facility]. The patient otherwise will follow-up in 6 months. She will continue her tizanidine, and she was given a DMV form for handicap sticker.  Marlan Palau MD 08/21/2014 9:09 PM  [facility] [address] [place], [place] [address]  Phone [phone] Fax [phone]

## 2014-08-21 NOTE — Patient Instructions (Signed)

## 2014-08-29 ENCOUNTER — Encounter: Payer: Self-pay | Admitting: Neurology

## 2014-09-04 DIAGNOSIS — E119 Type 2 diabetes mellitus without complications: Secondary | ICD-10-CM | POA: Diagnosis not present

## 2014-09-04 DIAGNOSIS — H25013 Cortical age-related cataract, bilateral: Secondary | ICD-10-CM | POA: Diagnosis not present

## 2014-09-04 DIAGNOSIS — H2513 Age-related nuclear cataract, bilateral: Secondary | ICD-10-CM | POA: Diagnosis not present

## 2014-09-17 ENCOUNTER — Encounter: Payer: Self-pay | Admitting: Neurology

## 2014-10-09 DIAGNOSIS — G248 Other dystonia: Secondary | ICD-10-CM | POA: Diagnosis not present

## 2014-10-09 DIAGNOSIS — G3185 Corticobasal degeneration: Secondary | ICD-10-CM | POA: Diagnosis not present

## 2014-10-15 ENCOUNTER — Telehealth: Payer: Self-pay | Admitting: Neurology

## 2014-10-15 NOTE — Telephone Encounter (Signed)
Patient call and stated she saw an appointment changed message on My Chart but she could not see the date change to.  I looked in Epic and her next appointment is 02/19/15. She had been at [facility] and wondered if Dr Anne HahnWillis wanted to see her sooner because of this.  I did let her know I didn't see a change.  PLEASE CALL HER ONLY IF THERE IS SOMETHING ELSE I HAVE MISSED.   Thanks!

## 2014-10-15 NOTE — Telephone Encounter (Signed)
Patient states she went to [facility] on 10/[facility]/14 as a referral from Dr. Anne HahnWillis. She would like to know if Dr. Anne HahnWillis has received any of the results from that appointment and if she should come in for an appointment sooner than the one scheduled in August. I told her that as far as I know she should keep the appointment in August and if Dr. Anne HahnWillis would like to see her sooner, we would call to change the appointment.

## 2014-10-21 ENCOUNTER — Encounter: Payer: Self-pay | Admitting: Neurology

## 2014-10-28 ENCOUNTER — Other Ambulatory Visit: Payer: Self-pay | Admitting: Neurology

## 2014-10-28 NOTE — Telephone Encounter (Signed)
Per notes 11/17

## 2014-10-29 DIAGNOSIS — M6281 Muscle weakness (generalized): Secondary | ICD-10-CM | POA: Diagnosis not present

## 2014-10-29 DIAGNOSIS — R262 Difficulty in walking, not elsewhere classified: Secondary | ICD-10-CM | POA: Diagnosis not present

## 2014-10-29 DIAGNOSIS — G249 Dystonia, unspecified: Secondary | ICD-10-CM | POA: Diagnosis not present

## 2014-10-31 DIAGNOSIS — R351 Nocturia: Secondary | ICD-10-CM | POA: Diagnosis not present

## 2014-10-31 DIAGNOSIS — Z79899 Other long term (current) drug therapy: Secondary | ICD-10-CM | POA: Diagnosis not present

## 2014-10-31 DIAGNOSIS — G3185 Corticobasal degeneration: Secondary | ICD-10-CM | POA: Diagnosis not present

## 2014-10-31 DIAGNOSIS — G249 Dystonia, unspecified: Secondary | ICD-10-CM | POA: Diagnosis not present

## 2014-10-31 DIAGNOSIS — E538 Deficiency of other specified B group vitamins: Secondary | ICD-10-CM | POA: Diagnosis not present

## 2014-10-31 DIAGNOSIS — I10 Essential (primary) hypertension: Secondary | ICD-10-CM | POA: Diagnosis not present

## 2014-11-01 DIAGNOSIS — R262 Difficulty in walking, not elsewhere classified: Secondary | ICD-10-CM | POA: Diagnosis not present

## 2014-11-01 DIAGNOSIS — G249 Dystonia, unspecified: Secondary | ICD-10-CM | POA: Diagnosis not present

## 2014-11-01 DIAGNOSIS — M6281 Muscle weakness (generalized): Secondary | ICD-10-CM | POA: Diagnosis not present

## 2014-11-04 DIAGNOSIS — M6281 Muscle weakness (generalized): Secondary | ICD-10-CM | POA: Diagnosis not present

## 2014-11-04 DIAGNOSIS — R262 Difficulty in walking, not elsewhere classified: Secondary | ICD-10-CM | POA: Diagnosis not present

## 2014-11-04 DIAGNOSIS — G249 Dystonia, unspecified: Secondary | ICD-10-CM | POA: Diagnosis not present

## 2014-11-06 DIAGNOSIS — G249 Dystonia, unspecified: Secondary | ICD-10-CM | POA: Diagnosis not present

## 2014-11-06 DIAGNOSIS — R262 Difficulty in walking, not elsewhere classified: Secondary | ICD-10-CM | POA: Diagnosis not present

## 2014-11-06 DIAGNOSIS — M6281 Muscle weakness (generalized): Secondary | ICD-10-CM | POA: Diagnosis not present

## 2014-11-08 DIAGNOSIS — G249 Dystonia, unspecified: Secondary | ICD-10-CM | POA: Diagnosis not present

## 2014-11-08 DIAGNOSIS — R262 Difficulty in walking, not elsewhere classified: Secondary | ICD-10-CM | POA: Diagnosis not present

## 2014-11-08 DIAGNOSIS — M6281 Muscle weakness (generalized): Secondary | ICD-10-CM | POA: Diagnosis not present

## 2014-11-11 DIAGNOSIS — G249 Dystonia, unspecified: Secondary | ICD-10-CM | POA: Diagnosis not present

## 2014-11-11 DIAGNOSIS — R262 Difficulty in walking, not elsewhere classified: Secondary | ICD-10-CM | POA: Diagnosis not present

## 2014-11-11 DIAGNOSIS — M6281 Muscle weakness (generalized): Secondary | ICD-10-CM | POA: Diagnosis not present

## 2014-11-15 DIAGNOSIS — M6281 Muscle weakness (generalized): Secondary | ICD-10-CM | POA: Diagnosis not present

## 2014-11-15 DIAGNOSIS — R262 Difficulty in walking, not elsewhere classified: Secondary | ICD-10-CM | POA: Diagnosis not present

## 2014-11-15 DIAGNOSIS — G249 Dystonia, unspecified: Secondary | ICD-10-CM | POA: Diagnosis not present

## 2014-11-18 DIAGNOSIS — G249 Dystonia, unspecified: Secondary | ICD-10-CM | POA: Diagnosis not present

## 2014-11-18 DIAGNOSIS — R262 Difficulty in walking, not elsewhere classified: Secondary | ICD-10-CM | POA: Diagnosis not present

## 2014-11-18 DIAGNOSIS — M6281 Muscle weakness (generalized): Secondary | ICD-10-CM | POA: Diagnosis not present

## 2014-11-22 DIAGNOSIS — I10 Essential (primary) hypertension: Secondary | ICD-10-CM | POA: Diagnosis not present

## 2014-11-22 DIAGNOSIS — Z79899 Other long term (current) drug therapy: Secondary | ICD-10-CM | POA: Diagnosis not present

## 2014-11-22 DIAGNOSIS — G249 Dystonia, unspecified: Secondary | ICD-10-CM | POA: Diagnosis not present

## 2014-11-28 DIAGNOSIS — G248 Other dystonia: Secondary | ICD-10-CM | POA: Diagnosis not present

## 2014-12-04 DIAGNOSIS — R278 Other lack of coordination: Secondary | ICD-10-CM | POA: Diagnosis not present

## 2014-12-04 DIAGNOSIS — G248 Other dystonia: Secondary | ICD-10-CM | POA: Diagnosis not present

## 2014-12-04 DIAGNOSIS — M6281 Muscle weakness (generalized): Secondary | ICD-10-CM | POA: Diagnosis not present

## 2014-12-06 DIAGNOSIS — R278 Other lack of coordination: Secondary | ICD-10-CM | POA: Diagnosis not present

## 2014-12-06 DIAGNOSIS — G248 Other dystonia: Secondary | ICD-10-CM | POA: Diagnosis not present

## 2014-12-06 DIAGNOSIS — M6281 Muscle weakness (generalized): Secondary | ICD-10-CM | POA: Diagnosis not present

## 2014-12-12 DIAGNOSIS — R351 Nocturia: Secondary | ICD-10-CM | POA: Diagnosis not present

## 2014-12-12 DIAGNOSIS — I10 Essential (primary) hypertension: Secondary | ICD-10-CM | POA: Diagnosis not present

## 2014-12-12 DIAGNOSIS — G249 Dystonia, unspecified: Secondary | ICD-10-CM | POA: Diagnosis not present

## 2014-12-12 DIAGNOSIS — E538 Deficiency of other specified B group vitamins: Secondary | ICD-10-CM | POA: Diagnosis not present

## 2014-12-13 DIAGNOSIS — M6281 Muscle weakness (generalized): Secondary | ICD-10-CM | POA: Diagnosis not present

## 2014-12-13 DIAGNOSIS — R278 Other lack of coordination: Secondary | ICD-10-CM | POA: Diagnosis not present

## 2014-12-13 DIAGNOSIS — G248 Other dystonia: Secondary | ICD-10-CM | POA: Diagnosis not present

## 2014-12-19 DIAGNOSIS — M6281 Muscle weakness (generalized): Secondary | ICD-10-CM | POA: Diagnosis not present

## 2014-12-19 DIAGNOSIS — G248 Other dystonia: Secondary | ICD-10-CM | POA: Diagnosis not present

## 2014-12-19 DIAGNOSIS — R278 Other lack of coordination: Secondary | ICD-10-CM | POA: Diagnosis not present

## 2014-12-24 DIAGNOSIS — M6281 Muscle weakness (generalized): Secondary | ICD-10-CM | POA: Diagnosis not present

## 2014-12-24 DIAGNOSIS — R278 Other lack of coordination: Secondary | ICD-10-CM | POA: Diagnosis not present

## 2014-12-24 DIAGNOSIS — G248 Other dystonia: Secondary | ICD-10-CM | POA: Diagnosis not present

## 2014-12-26 DIAGNOSIS — I10 Essential (primary) hypertension: Secondary | ICD-10-CM | POA: Diagnosis not present

## 2014-12-26 DIAGNOSIS — E119 Type 2 diabetes mellitus without complications: Secondary | ICD-10-CM | POA: Diagnosis not present

## 2014-12-27 DIAGNOSIS — M6281 Muscle weakness (generalized): Secondary | ICD-10-CM | POA: Diagnosis not present

## 2014-12-27 DIAGNOSIS — G248 Other dystonia: Secondary | ICD-10-CM | POA: Diagnosis not present

## 2014-12-27 DIAGNOSIS — R278 Other lack of coordination: Secondary | ICD-10-CM | POA: Diagnosis not present

## 2014-12-31 DIAGNOSIS — M6281 Muscle weakness (generalized): Secondary | ICD-10-CM | POA: Diagnosis not present

## 2014-12-31 DIAGNOSIS — G248 Other dystonia: Secondary | ICD-10-CM | POA: Diagnosis not present

## 2014-12-31 DIAGNOSIS — R278 Other lack of coordination: Secondary | ICD-10-CM | POA: Diagnosis not present

## 2015-01-06 DIAGNOSIS — M6281 Muscle weakness (generalized): Secondary | ICD-10-CM | POA: Diagnosis not present

## 2015-01-06 DIAGNOSIS — R278 Other lack of coordination: Secondary | ICD-10-CM | POA: Diagnosis not present

## 2015-01-06 DIAGNOSIS — G248 Other dystonia: Secondary | ICD-10-CM | POA: Diagnosis not present

## 2015-01-08 DIAGNOSIS — R278 Other lack of coordination: Secondary | ICD-10-CM | POA: Diagnosis not present

## 2015-01-08 DIAGNOSIS — M6281 Muscle weakness (generalized): Secondary | ICD-10-CM | POA: Diagnosis not present

## 2015-01-08 DIAGNOSIS — G248 Other dystonia: Secondary | ICD-10-CM | POA: Diagnosis not present

## 2015-01-13 DIAGNOSIS — G248 Other dystonia: Secondary | ICD-10-CM | POA: Diagnosis not present

## 2015-01-13 DIAGNOSIS — M6281 Muscle weakness (generalized): Secondary | ICD-10-CM | POA: Diagnosis not present

## 2015-01-13 DIAGNOSIS — R278 Other lack of coordination: Secondary | ICD-10-CM | POA: Diagnosis not present

## 2015-01-16 DIAGNOSIS — G248 Other dystonia: Secondary | ICD-10-CM | POA: Diagnosis not present

## 2015-01-16 DIAGNOSIS — M6281 Muscle weakness (generalized): Secondary | ICD-10-CM | POA: Diagnosis not present

## 2015-01-16 DIAGNOSIS — R278 Other lack of coordination: Secondary | ICD-10-CM | POA: Diagnosis not present

## 2015-01-17 ENCOUNTER — Other Ambulatory Visit: Payer: Self-pay

## 2015-01-17 DIAGNOSIS — Z1231 Encounter for screening mammogram for malignant neoplasm of breast: Secondary | ICD-10-CM

## 2015-01-22 DIAGNOSIS — R278 Other lack of coordination: Secondary | ICD-10-CM | POA: Diagnosis not present

## 2015-01-22 DIAGNOSIS — M6281 Muscle weakness (generalized): Secondary | ICD-10-CM | POA: Diagnosis not present

## 2015-01-22 DIAGNOSIS — G248 Other dystonia: Secondary | ICD-10-CM | POA: Diagnosis not present

## 2015-01-23 ENCOUNTER — Other Ambulatory Visit: Payer: Self-pay | Admitting: Neurology

## 2015-01-23 DIAGNOSIS — G248 Other dystonia: Secondary | ICD-10-CM | POA: Diagnosis not present

## 2015-01-23 DIAGNOSIS — M6281 Muscle weakness (generalized): Secondary | ICD-10-CM | POA: Diagnosis not present

## 2015-01-23 DIAGNOSIS — R278 Other lack of coordination: Secondary | ICD-10-CM | POA: Diagnosis not present

## 2015-01-27 ENCOUNTER — Encounter (HOSPITAL_COMMUNITY): Payer: Self-pay | Admitting: Emergency Medicine

## 2015-01-27 ENCOUNTER — Emergency Department (HOSPITAL_COMMUNITY): Payer: Medicare Other

## 2015-01-27 ENCOUNTER — Emergency Department (HOSPITAL_COMMUNITY)
Admission: EM | Admit: 2015-01-27 | Discharge: 2015-01-27 | Disposition: A | Discharge status: Home / Self Care | Payer: Medicare Other | Attending: Emergency Medicine | Admitting: Emergency Medicine

## 2015-01-27 DIAGNOSIS — I10 Essential (primary) hypertension: Secondary | ICD-10-CM | POA: Insufficient documentation

## 2015-01-27 DIAGNOSIS — Z7982 Long term (current) use of aspirin: Secondary | ICD-10-CM | POA: Insufficient documentation

## 2015-01-27 DIAGNOSIS — W1839XA Other fall on same level, initial encounter: Secondary | ICD-10-CM | POA: Insufficient documentation

## 2015-01-27 DIAGNOSIS — Z8669 Personal history of other diseases of the nervous system and sense organs: Secondary | ICD-10-CM | POA: Diagnosis not present

## 2015-01-27 DIAGNOSIS — R030 Elevated blood-pressure reading, without diagnosis of hypertension: Secondary | ICD-10-CM | POA: Diagnosis not present

## 2015-01-27 DIAGNOSIS — E119 Type 2 diabetes mellitus without complications: Secondary | ICD-10-CM | POA: Insufficient documentation

## 2015-01-27 DIAGNOSIS — S32010A Wedge compression fracture of first lumbar vertebra, initial encounter for closed fracture: Secondary | ICD-10-CM | POA: Diagnosis not present

## 2015-01-27 DIAGNOSIS — M549 Dorsalgia, unspecified: Secondary | ICD-10-CM | POA: Diagnosis not present

## 2015-01-27 DIAGNOSIS — S32018A Other fracture of first lumbar vertebra, initial encounter for closed fracture: Secondary | ICD-10-CM | POA: Diagnosis not present

## 2015-01-27 DIAGNOSIS — Y998 Other external cause status: Secondary | ICD-10-CM | POA: Diagnosis not present

## 2015-01-27 DIAGNOSIS — Z862 Personal history of diseases of the blood and blood-forming organs and certain disorders involving the immune mechanism: Secondary | ICD-10-CM | POA: Insufficient documentation

## 2015-01-27 DIAGNOSIS — Z79899 Other long term (current) drug therapy: Secondary | ICD-10-CM | POA: Diagnosis not present

## 2015-01-27 DIAGNOSIS — E538 Deficiency of other specified B group vitamins: Secondary | ICD-10-CM | POA: Insufficient documentation

## 2015-01-27 DIAGNOSIS — Z9889 Other specified postprocedural states: Secondary | ICD-10-CM | POA: Insufficient documentation

## 2015-01-27 DIAGNOSIS — Z8742 Personal history of other diseases of the female genital tract: Secondary | ICD-10-CM | POA: Insufficient documentation

## 2015-01-27 DIAGNOSIS — S3992XA Unspecified injury of lower back, initial encounter: Secondary | ICD-10-CM | POA: Diagnosis present

## 2015-01-27 DIAGNOSIS — Y9301 Activity, walking, marching and hiking: Secondary | ICD-10-CM | POA: Diagnosis not present

## 2015-01-27 DIAGNOSIS — Y9289 Other specified places as the place of occurrence of the external cause: Secondary | ICD-10-CM | POA: Diagnosis not present

## 2015-01-27 LAB — URINALYSIS, ROUTINE W REFLEX MICROSCOPIC
Bilirubin Urine: NEGATIVE
GLUCOSE, UA: NEGATIVE mg/dL
HGB URINE DIPSTICK: NEGATIVE
Ketones, ur: NEGATIVE mg/dL
NITRITE: NEGATIVE
Protein, ur: NEGATIVE mg/dL
SPECIFIC GRAVITY, URINE: 1.009 (ref 1.005–1.030)
Urobilinogen, UA: 0.2 mg/dL (ref 0.0–1.0)
pH: 7.5 (ref 5.0–8.0)

## 2015-01-27 LAB — URINE MICROSCOPIC-ADD ON

## 2015-01-27 LAB — I-STAT CHEM 8, ED
BUN: 18 mg/dL (ref 6–20)
CREATININE: 0.8 mg/dL (ref 0.44–1.00)
Calcium, Ion: 1.26 mmol/L (ref 1.13–1.30)
Chloride: 93 mmol/L — ABNORMAL LOW (ref 101–111)
GLUCOSE: 137 mg/dL — AB (ref 65–99)
HCT: 39 % (ref 36.0–46.0)
Hemoglobin: 13.3 g/dL (ref 12.0–15.0)
Potassium: 3.9 mmol/L (ref 3.5–5.1)
SODIUM: 134 mmol/L — AB (ref 135–145)
TCO2: 29 mmol/L (ref 0–100)

## 2015-01-27 MED ORDER — ONDANSETRON 4 MG PO TBDP
4.0000 mg | ORAL_TABLET | Freq: Once | ORAL | Status: AC
  Administered 2015-01-27: 4 mg via ORAL
  Filled 2015-01-27: qty 1

## 2015-01-27 MED ORDER — OXYCODONE HCL 5 MG PO TABS
2.5000 mg | ORAL_TABLET | Freq: Once | ORAL | Status: AC
  Administered 2015-01-27: 2.5 mg via ORAL
  Filled 2015-01-27: qty 1

## 2015-01-27 MED ORDER — OXYCODONE HCL 5 MG PO TABS: 2.5000 mg | ORAL_TABLET | ORAL | Status: DC | PRN

## 2015-01-27 MED ORDER — IBUPROFEN 200 MG PO TABS
400.0000 mg | ORAL_TABLET | Freq: Once | ORAL | Status: AC
  Administered 2015-01-27: 400 mg via ORAL
  Filled 2015-01-27: qty 2

## 2015-01-27 MED ORDER — ONDANSETRON 4 MG PO TBDP: ORAL_TABLET | ORAL | Status: DC

## 2015-01-27 NOTE — ED Provider Notes (Signed)
CSN: [id number]     Arrival date & time 01/27/15  1409 History   First MD Initiated Contact with Patient 01/27/15 1503     Chief Complaint  Patient presents with  . Back Pain     (Consider location/radiation/quality/duration/timing/severity/associated sxs/prior Treatment) Patient is a 81 y.o. female presenting with fall. The history is provided by the patient.  Fall This is a new problem. The current episode started 2 days ago. The problem occurs rarely. The problem has been gradually worsening. Pertinent negatives include no chest pain, no headaches and no shortness of breath. Associated symptoms comments: Back pain . The symptoms are aggravated by bending and twisting. Nothing relieves the symptoms. She has tried nothing for the symptoms. The treatment provided no relief.   81 yo F with a chief complaint of fall. Happened through 4 days ago. Patient was walking and was trying to unplug her golf cart pulled too hard and fell onto her back. Since then patient has had some right lower back pain without radiation. Patient denies difficulty with bowel or bladder. Denies loss of sensation. Patient's pain has slowly worsened over the initial onset. Patient denies fevers chills denies dysuria denies worsening weakness. Denies abdominal pain denies blood in stool.  Past Medical History  Diagnosis Date  . Diabetes mellitus   . Fibromyalgia   . Fibrocystic breast disease   . Hypertension   . GERD (gastroesophageal reflux disease)   . Carpal tunnel syndrome   . RBBB   . Mild cognitive impairment   . Focal dystonia   . Leukopenia   . Vitamin B12 deficiency    Past Surgical History  Procedure Laterality Date  . Appendectomy    . Tonsillectomy    . Cardiac catheterization      normal   Family History  Problem Relation Age of Onset  . CAD Father   . Stroke Other   . Liver cancer Other    History  Substance Use Topics  . Smoking status: Never Smoker   . Smokeless tobacco: Never Used   . Alcohol Use: No   OB History    No data available     Review of Systems  Constitutional: Negative for fever and chills.  HENT: Negative for congestion and rhinorrhea.   Eyes: Negative for redness and visual disturbance.  Respiratory: Negative for shortness of breath and wheezing.   Cardiovascular: Negative for chest pain and palpitations.  Gastrointestinal: Negative for nausea and vomiting.  Genitourinary: Negative for dysuria and urgency.  Musculoskeletal: Positive for myalgias (Low back pain worse on the right side above the iliac crests), back pain and gait problem (at baseline no change in difficulty.). Negative for arthralgias.  Skin: Negative for pallor and wound.  Neurological: Negative for dizziness and headaches.      Allergies  Mold extract; Ace inhibitors; Codeine; Darvon; Novocain; and Xylocaine jelly  Home Medications   Prior to Admission medications   Medication Sig Start Date End Date Taking? Authorizing Provider  amLODipine (NORVASC) 10 MG tablet Take 5 mg by mouth daily.  06/22/14  Yes Historical Provider, MD  aspirin EC 81 MG tablet Take 81 mg by mouth daily.   Yes Historical Provider, MD  bismuth subsalicylate (PEPTO BISMOL) 262 MG/15ML suspension Take 30 mLs by mouth every 6 (six) hours as needed for indigestion or diarrhea or loose stools.   Yes Historical Provider, MD  calcium-vitamin D (OSCAL WITH D) 500-200 MG-UNIT per tablet Take 1 tablet by mouth 3 (three) times daily.  Yes Historical Provider, MD  Carbidopa-Levodopa ER (SINEMET CR) 25-100 MG tablet controlled release Take 1 tablet by mouth 3 (three) times daily. 01/07/15  Yes Historical Provider, MD  cetirizine (ZYRTEC) 10 MG tablet Take 5 mg by mouth daily.   Yes Historical Provider, MD  cholecalciferol (VITAMIN D) 1000 UNITS tablet Take 1,000 Units by mouth daily.    Yes Historical Provider, MD  glipiZIDE (GLUCOTROL) 5 MG tablet Take 2.5 mg by mouth 2 (two) times daily before a meal.    Yes  Historical Provider, MD  glucosamine-chondroitin 500-400 MG tablet Take 1 tablet by mouth daily.   Yes Historical Provider, MD  hydrochlorothiazide (MICROZIDE) 12.5 MG capsule Take 12.5 mg by mouth daily. 11/28/14  Yes Historical Provider, MD  KRILL OIL PO Take 1 capsule by mouth daily.   Yes Historical Provider, MD  metFORMIN (GLUCOPHAGE) 500 MG tablet Take 1 tablet (500 mg total) by mouth 2 (two) times daily with a meal. Do not restart Metformin until 7/12 am then continue prior home dosing of 1 tablet in am, 1 tablet at noon and 2 tablets in PM Patient taking differently: Take 500 mg by mouth See admin instructions. Take with each meal ( 3 times daily) and before bed 01/04/12  Yes Quintella Reichert, MD  multivitamin-lutein (OCUVITE-LUTEIN) CAPS Take 1 capsule by mouth daily.   Yes Historical Provider, MD  tiZANidine (ZANAFLEX) 2 MG tablet TAKE 1 TABLET IN THE MORNING, 1 AT MIDDAY, 2 IN THE EVENING 10/28/14  Yes York Spaniel, MD  vitamin B-12 (CYANOCOBALAMIN) 1000 MCG tablet Take 1,000 mcg by mouth daily.   Yes Historical Provider, MD  ACCU-CHEK AVIVA PLUS test strip  06/27/14   Historical Provider, MD  ondansetron (ZOFRAN ODT) 4 MG disintegrating tablet  ODT q4 hours prn nausea/vomit 01/27/15   Melene Plan, DO  oxyCODONE (ROXICODONE) 5 MG immediate release tablet Take 0.5 tablets (2.5 mg total) by mouth every 4 (four) hours as needed for severe pain. 01/27/15   Melene Plan, DO  tiZANidine (ZANAFLEX) 2 MG tablet TAKE 1 TABLET IN THE MORNING, 1 AT MIDDAY, 2 IN THE EVENING Patient not taking: Reported on 01/27/2015 01/23/15   York Spaniel, MD   BP 168/64 mmHg  Pulse 84  Temp(Src) 98.3 F (36.8 C) (Oral)  Resp 18  SpO2 91% Physical Exam  Constitutional: She is oriented to person, place, and time. She appears well-developed and well-nourished. No distress.  HENT:  Head: Normocephalic and atraumatic.  Eyes: EOM are normal. Pupils are equal, round, and reactive to light.  Neck: Normal range of motion.  Neck supple.  Cardiovascular: Normal rate and regular rhythm.  Exam reveals no gallop and no friction rub.   No murmur heard. Pulmonary/Chest: Effort normal. She has no wheezes. She has no rales.  Abdominal: Soft. She exhibits no distension. There is no tenderness.  Musculoskeletal: She exhibits tenderness (Worse right of midline right above the iliac crest, ). She exhibits no edema.  5 out of 5 muscle strength bilateral lower extremities. No noted clonus sensation pulse motor intact bilaterally. Patient refused gait is states her gait is poor at baseline.   Neurological: She is alert and oriented to person, place, and time.  Skin: Skin is warm and dry. She is not diaphoretic.  Psychiatric: She has a normal mood and affect. Her behavior is normal.    ED Course  Procedures (including critical care time) Labs Review Labs Reviewed  URINALYSIS, ROUTINE W REFLEX MICROSCOPIC (NOT AT [facility]) - Abnormal;  Notable for the following:    Leukocytes, UA MODERATE (*)    All other components within normal limits  I-STAT CHEM 8, ED - Abnormal; Notable for the following:    Sodium 134 (*)    Chloride 93 (*)    Glucose, Bld 137 (*)    All other components within normal limits  URINE MICROSCOPIC-ADD ON    Imaging Review Ct Lumbar Spine Wo Contrast  01/27/2015   CLINICAL DATA:  Low back pain following a fall 2 days ago.  EXAM: CT LUMBAR SPINE WITHOUT CONTRAST  TECHNIQUE: Multidetector CT imaging of the lumbar spine was performed without intravenous contrast administration. Multiplanar CT image reconstructions were also generated.  COMPARISON:  Report dated 10/05/2001.  FINDINGS: Five non-rib-bearing lumbar vertebrae. Mildly comminuted fracture in the superior endplate of the L1 vertebral body, anteriorly. There is approximately 15% depression. No posterior extension and no bony retropulsion.  T11-12:  Bilateral lateral spur formation, greater on the right.  T12-L1: Vacuum phenomenon in the disc and bilateral  lateral spur formation.  L1-2: Vacuum phenomenon in the disc. Moderate anterior and mild to moderate bilateral lateral disc bulging and spur formation.  L2-3: Diffuse disc bulging and minimal bilateral facet hypertrophy with minimal canal stenosis.  L3-4: Diffuse disc bulging and bilateral facet and ligamentum flavum hypertrophy, producing mild to moderate canal stenosis and mild to moderate bilateral foraminal stenosis.  L4-5: Diffuse disc bulging and bilateral facet and ligamentum flavum hypertrophy causing moderate canal stenosis and mild bilateral foraminal stenosis.  L5-S1: Marked bilateral facet hypertrophy and mild bilateral ligamentum flavum hypertrophy causing mild canal stenosis and mild bilateral foraminal stenosis.  No disc herniations are seen. Atheromatous arterial calcifications are noted. Small amount of linear atelectasis or scarring at the right lung base. The included urinary bladder, uterus and ovaries are unremarkable. No included intestinal abnormalities.  IMPRESSION: 1. Mildly comminuted, acute L1 superior endplate compression fracture anteriorly with 15% depression. No posterior extension or bony retropulsion. 2. Degenerative changes, as described above.   Electronically Signed   By: Beckie Salts M.D.   On: 01/27/2015 16:14     EKG Interpretation None      MDM   Final diagnoses:  Compression fracture of L1 lumbar vertebra, closed, initial encounter    81 yo F with a chief complaint of low back pain. Due to patient's age and risk factors will obtain a CT scan of the L-spine. Check urine I stat chem 8.  CT scan found to have a superior endplate fracture of L1. Spoke with Dr. Lovell Sheehan from neurosurgery. He viewed the images feel that she is safe for a lumbar corset brace.   Patient feeling better and her brace. We'll send her home with pain and nausea medicine. Patient concerned about ability to take care of herself at home however patient was able to take care of herself at  home for the past 5 days with this injury. Long discussion with the patient and she will see herpes. Tomorrow and discuss other options.  8:55 PM:  I have discussed the diagnosis/risks/treatment options with the patient and believe the pt to be eligible for discharge home to follow-up with PCP. We also discussed returning to the ED immediately if new or worsening sx occur. We discussed the sx which are most concerning (e.g., cauda equina symptoms) that necessitate immediate return. Medications administered to the patient during their visit and any new prescriptions provided to the patient are listed below.  Medications given during this visit Medications  ondansetron (ZOFRAN-ODT) disintegrating tablet 4 mg (4 mg Oral Given 01/27/15 1543)  oxyCODONE (Oxy IR/ROXICODONE) immediate release tablet 2.5 mg (2.5 mg Oral Given 01/27/15 1543)  ibuprofen (ADVIL,MOTRIN) tablet 400 mg (400 mg Oral Given 01/27/15 1542)    New Prescriptions   ONDANSETRON (ZOFRAN ODT) 4 MG DISINTEGRATING TABLET    4mg  ODT q4 hours prn nausea/vomit   OXYCODONE (ROXICODONE) 5 MG IMMEDIATE RELEASE TABLET    Take 0.5 tablets (2.5 mg total) by mouth every 4 (four) hours as needed for severe pain.     The patient appears reasonably screen and/or stabilized for discharge and I doubt any other medical condition or other [facility] requiring further screening, evaluation, or treatment in the ED at this time prior to discharge.    Melene Plan, DO 01/27/15 2055

## 2015-01-27 NOTE — Progress Notes (Signed)
CSW attempted to meet with patient at bedside. However, nurse tech is about to assist patient with bathing.   CSW will check back in later.  Trish Mage [phone] ED CSW 01/27/2015 6:37 PM

## 2015-01-27 NOTE — ED Notes (Signed)
Awake. Verbally responsive. A/O x4. Resp even and unlabored. No audible adventitious breath sounds noted. ABC's intact. Family at bedside. 

## 2015-01-27 NOTE — ED Notes (Signed)
Awake. Verbally responsive. A/O x4. Resp even and unlabored. No audible adventitious breath sounds noted. ABC's intact.  

## 2015-01-27 NOTE — ED Notes (Signed)
Tracy [name], [employer] at bedside to do fitting for back brace.

## 2015-01-27 NOTE — Discharge Instructions (Signed)
Lumbar Fracture °A fracture of a bone is the same as a break in the bone. A fracture in the lumbar area is a break that involves one of many parts that make up the 5 bones of the low back area. This is just above the pelvis.  °CAUSES °Most of these injuries occur as a result of an accident such as: °· A fall. °· A car accident. °· Recreational activities. °· A smaller number occur due to: °¨ Industrial, farm, and aviation accidents. °¨ Gunshot wounds and direct blows to the back. °¨ Parachuting incidents. °Most lumbar fractures affect the "building blocks" or the main portion of the spine known as the "vertebral bodies" (see the image on the right). A smaller number involve breaks to portions of bone that extend to the sides or backward behind the vertebral body. In the elderly, a sudden break can happen without an apparent cause. This is because the bones of the back have become extremely thin and fragile. This condition is known as osteoporosis. °SYMPTOMS °Patients with lumbar fractures have severe pain even if the actual break is small or limited, and there is no injury to nearby nerves. More severe or complex injuries involving other bones and/or organs may include:  °· Deformity of the back bones. °· Swelling/bruising over the injured area. °· Limited ability to move the affected area. °· Partial or complete loss of function of the bladder and/or bowels. (This may be due to injury to nearby nerves). °· More severe injuries can also cause: °¨ Loss of sensation and/or strength in the legs, feet, and toes. °¨ Paralysis. °DIAGNOSIS °In most cases, a lumbar fracture will be suspected by what happened just prior to the onset of back pain. X-rays and special imaging (CT scan and MRI imaging) are used to confirm the diagnosis as well as finding out the type and severity of the break or breaks. These tests guide treatment. But there are times when special imaging cannot be done. For example, MRI cannot be done if there  is an implanted metallic device (such as a pacemaker). In these cases, other tests and imaging are done. °If there has been nerve damage, more tests can be done. These include: °· Tests of nerve function through muscles (nerve conduction studies and electromyography). °· Tests of bladder function (urodynamics). °· Tests that focus on defining specific nerve problems before surgery and what improvement has come about after surgery (evoked potentials). °TREATMENT °Common injuries may involve a small break off of the main surface of the back bone. Or they may be in the form of a partial flattening or compression of the bone. Hospital care may not be needed for these. Medicine for pain control, special back bracing, and limitations in activity are done first. Physical therapy follows later. °Complex breaks, multiple fractures of the spine, or unstable injuries can damage the spinal cord. They may require an operation to remove pressure from the nerves and/or spinal cord and to stabilize the broken pieces of bone. Each individual set of injuries is unique. The surgeon will take into consideration many things when planning the best surgical approach that will give the highest likelihood of a good outcome.  °HOME CARE INSTRUCTIONS °There is pain and stiffness in the back for weeks after a vertebral fracture. Bed rest, pain medicine, and a slow return to activity are generally recommended. Neck and back braces may be helpful in reducing pain and increasing mobility. When your pain allows, simple walking will help to begin the   process of returning to normal activities. Exercises to improve motion and to strengthen the back may also be useful after the initial pain goes away. This will be guided by your caregiver and the team (nurses, physical therapists, occupational therapists, etc.) involved with your ongoing care. For the elderly, treatment for osteoporosis may be needed to help reduce the risk of fractures in the  future. °Arrange for follow-up care as recommended to assure proper long-term care and prevention of further spine injury. The failure to follow-up as recommended could result in permanent injury, disability, and a chronic painful condition. °SEEK MEDICAL CARE IF: °· Pain is not effectively controlled with medication. °· You feel unable to decrease pain medication over time as planned. °· Activity level is not improving as planned and/or expected. °SEEK IMMEDIATE MEDICAL CARE IF: °· You have increasing pain, vomiting, or are unable to move around at all. °· You have numbness, tingling, weakness, or paralysis of any part of your body. °· You have loss of normal bowel or bladder control. °· You have difficulty breathing, cough, fever, chest or abdominal pain. °Document Released: 09/29/2006 Document Revised: 09/06/2011 Document Reviewed: 05/30/2007 °ExitCare® Patient Information ©2015 [employer]. This information is not intended to replace advice given to you by your health care provider. Make sure you discuss any questions you have with your health care provider. ° °

## 2015-01-27 NOTE — Progress Notes (Signed)
Orthopedic Tech Progress Note Patient Details:  Tracy [name] 04-11-1934 [id number]  Patient ID: Tracy [name], female   DOB: Jun 21, 1934, 81 y.o.   MRN: [id number]   Tracy [name] 01/27/2015, 5:30 PMCalled bio-tech for lumbar corset.

## 2015-01-27 NOTE — ED Notes (Signed)
Pt arrived via EMS from [facility] with mid back pain s/p to fall on Saturday. Denies LOC, hitting head, headaches, dizziness/lightheadedness, visual disturbances, or nausea.

## 2015-01-27 NOTE — ED Notes (Signed)
Bed: [facility] Expected date:  Expected time:  Means of arrival:  Comments: EMS-fall on Sat.

## 2015-01-30 DIAGNOSIS — S32000D Wedge compression fracture of unspecified lumbar vertebra, subsequent encounter for fracture with routine healing: Secondary | ICD-10-CM | POA: Diagnosis not present

## 2015-01-30 DIAGNOSIS — M6281 Muscle weakness (generalized): Secondary | ICD-10-CM | POA: Diagnosis not present

## 2015-01-30 DIAGNOSIS — K5900 Constipation, unspecified: Secondary | ICD-10-CM | POA: Diagnosis not present

## 2015-01-30 DIAGNOSIS — I10 Essential (primary) hypertension: Secondary | ICD-10-CM | POA: Diagnosis not present

## 2015-01-30 DIAGNOSIS — E119 Type 2 diabetes mellitus without complications: Secondary | ICD-10-CM | POA: Diagnosis not present

## 2015-01-30 DIAGNOSIS — S32010A Wedge compression fracture of first lumbar vertebra, initial encounter for closed fracture: Secondary | ICD-10-CM | POA: Diagnosis not present

## 2015-01-30 DIAGNOSIS — M545 Low back pain: Secondary | ICD-10-CM | POA: Diagnosis not present

## 2015-01-31 DIAGNOSIS — S32000D Wedge compression fracture of unspecified lumbar vertebra, subsequent encounter for fracture with routine healing: Secondary | ICD-10-CM | POA: Diagnosis not present

## 2015-01-31 DIAGNOSIS — M545 Low back pain: Secondary | ICD-10-CM | POA: Diagnosis not present

## 2015-01-31 DIAGNOSIS — M6281 Muscle weakness (generalized): Secondary | ICD-10-CM | POA: Diagnosis not present

## 2015-01-31 DIAGNOSIS — Z79899 Other long term (current) drug therapy: Secondary | ICD-10-CM | POA: Diagnosis not present

## 2015-02-03 DIAGNOSIS — M6281 Muscle weakness (generalized): Secondary | ICD-10-CM | POA: Diagnosis not present

## 2015-02-03 DIAGNOSIS — M545 Low back pain: Secondary | ICD-10-CM | POA: Diagnosis not present

## 2015-02-03 DIAGNOSIS — S32000D Wedge compression fracture of unspecified lumbar vertebra, subsequent encounter for fracture with routine healing: Secondary | ICD-10-CM | POA: Diagnosis not present

## 2015-02-04 DIAGNOSIS — M6281 Muscle weakness (generalized): Secondary | ICD-10-CM | POA: Diagnosis not present

## 2015-02-04 DIAGNOSIS — S32000D Wedge compression fracture of unspecified lumbar vertebra, subsequent encounter for fracture with routine healing: Secondary | ICD-10-CM | POA: Diagnosis not present

## 2015-02-04 DIAGNOSIS — M545 Low back pain: Secondary | ICD-10-CM | POA: Diagnosis not present

## 2015-02-10 DIAGNOSIS — M6281 Muscle weakness (generalized): Secondary | ICD-10-CM | POA: Diagnosis not present

## 2015-02-10 DIAGNOSIS — S32000D Wedge compression fracture of unspecified lumbar vertebra, subsequent encounter for fracture with routine healing: Secondary | ICD-10-CM | POA: Diagnosis not present

## 2015-02-19 ENCOUNTER — Ambulatory Visit: Payer: Medicare Other | Admitting: Neurology

## 2015-02-26 ENCOUNTER — Ambulatory Visit: Payer: BC Managed Care – PPO

## 2015-02-27 DIAGNOSIS — G3185 Corticobasal degeneration: Secondary | ICD-10-CM | POA: Diagnosis not present

## 2015-02-27 DIAGNOSIS — Z09 Encounter for follow-up examination after completed treatment for conditions other than malignant neoplasm: Secondary | ICD-10-CM | POA: Diagnosis not present

## 2015-02-27 DIAGNOSIS — G248 Other dystonia: Secondary | ICD-10-CM | POA: Diagnosis not present

## 2015-02-28 DIAGNOSIS — S32009A Unspecified fracture of unspecified lumbar vertebra, initial encounter for closed fracture: Secondary | ICD-10-CM | POA: Diagnosis not present

## 2015-02-28 DIAGNOSIS — I10 Essential (primary) hypertension: Secondary | ICD-10-CM | POA: Diagnosis not present

## 2015-03-10 ENCOUNTER — Telehealth: Payer: Self-pay | Admitting: Neurology

## 2015-03-10 NOTE — Telephone Encounter (Signed)
I called the patient. Appointment scheduled 10/12.

## 2015-03-10 NOTE — Telephone Encounter (Signed)
Patient called to reschedule her previously cancelled appointment 02/19/15. Dr. Clarisa Kindred next available 30 min OV slot is end of January. Patient's best day to come in is on Wednesday, She is at [facility] and they don't like to transport too late in the afternoon.

## 2015-04-09 ENCOUNTER — Ambulatory Visit (INDEPENDENT_AMBULATORY_CARE_PROVIDER_SITE_OTHER): Payer: Medicare Other | Admitting: Neurology

## 2015-04-09 ENCOUNTER — Encounter: Payer: Self-pay | Admitting: Neurology

## 2015-04-09 VITALS — BP 193/92 | HR 97 | Ht 64.0 in | Wt 160.5 lb

## 2015-04-09 DIAGNOSIS — G3185 Corticobasal degeneration: Secondary | ICD-10-CM | POA: Diagnosis not present

## 2015-04-09 DIAGNOSIS — G467 Other lacunar syndromes: Secondary | ICD-10-CM | POA: Diagnosis not present

## 2015-04-09 HISTORY — DX: Corticobasal degeneration: G31.85

## 2015-04-09 MED ORDER — DIAZEPAM 2 MG PO TABS: ORAL_TABLET | ORAL | Status: DC

## 2015-04-09 NOTE — Patient Instructions (Addendum)
  We will use low dose diazepam for the spasticity. Watch out for drowsiness or balance problems.   Dysphagia Swallowing problems (dysphagia) occur when solids and liquids seem to stick in your throat on the way down to your stomach, or the food takes longer to get to the stomach. Other symptoms include regurgitating food, noises coming from the throat, chest discomfort with swallowing, and a feeling of fullness or the feeling of something being stuck in your throat when swallowing. When blockage in your throat is complete, it may be associated with drooling. CAUSES  Problems with swallowing may occur because of problems with the muscles. The food cannot be propelled in the usual manner into your stomach. You may have ulcers, scar tissue, or inflammation in the tube down which food travels from your mouth to your stomach (esophagus), which blocks food from passing normally into the stomach. Causes of inflammation include:  Acid reflux from your stomach into your esophagus.  Infection.  Radiation treatment for cancer.  Medicines taken without enough fluids to wash them down into your stomach. You may have nerve problems that prevent signals from being sent to the muscles of your esophagus to contract and move your food down to your stomach. Globus pharyngeus is a relatively common problem in which there is a sense of an obstruction or difficulty in swallowing, without any physical abnormalities of the swallowing passages being found. This problem usually improves over time with reassurance and testing to rule out other causes. DIAGNOSIS Dysphagia can be diagnosed and its cause can be determined by tests in which you swallow a white substance that helps illuminate the inside of your throat (contrast medium) while X-rays are taken. Sometimes a flexible telescope that is inserted down your throat (endoscopy) to look at your esophagus and stomach is used. TREATMENT   If the dysphagia is caused by acid  reflux or infection, medicines may be used.  If the dysphagia is caused by problems with your swallowing muscles, swallowing therapy may be used to help you strengthen your swallowing muscles.  If the dysphagia is caused by a blockage or mass, procedures to remove the blockage may be done. HOME CARE INSTRUCTIONS  Try to eat soft food that is easier to swallow and check your weight on a daily basis to be sure that it is not decreasing.  Be sure to drink liquids when sitting upright (not lying down). SEEK MEDICAL CARE IF:  You are losing weight because you are unable to swallow.  You are coughing when you drink liquids (aspiration).  You are coughing up partially digested food. SEEK IMMEDIATE MEDICAL CARE IF:  You are unable to swallow your own saliva .  You are having shortness of breath or a fever, or both.  You have a hoarse voice along with difficulty swallowing. MAKE SURE YOU:  Understand these instructions.  Will watch your condition.  Will get help right away if you are not doing well or get worse.   This information is not intended to replace advice given to you by your health care provider. Make sure you discuss any questions you have with your health care provider.   Document Released: 06/11/2000 Document Revised: 07/05/2014 Document Reviewed: 12/01/2012 Elsevier Interactive Patient Education [employer]2016 Elsevier Inc.

## 2015-04-09 NOTE — Progress Notes (Addendum)
Reason for visit: Cortical basilar degeneration  Tracy [name] is an 81 y.o. female  History of present illness:  Tracy [name] is a 81 year old right-handed white female with a history of a progressive left-sided spasticity issue. The patient has been seen by Dr. Dorina Hoyer at [facility], and the diagnosis of cortical basilar degeneration has been confirmed. The patient is getting Botox injections, she was placed back on Sinemet even though this was not extremely helpful previously. The patient is on tizanidine for the spasticity taking 2 mg twice during the day, 4 mg at night. She still believes that it will drop her blood glucose levels down at times, her glucose may go down to 80. The patient has had a fall that occurred on 01/26/2015 when she was pulling out an electrical plug, and then fell backwards with a subsequent fracture of the L1 vertebra. The patient has not required kyphoplasty. She continues to have progression of the left-sided symptoms. She feels tightness in the throat, she has difficulty with speech because of this and she has also reported some issues with swallowing. The patient returns for an evaluation.  Past Medical History  Diagnosis Date  . Diabetes mellitus   . Fibromyalgia   . Fibrocystic breast disease   . Hypertension   . GERD (gastroesophageal reflux disease)   . Carpal tunnel syndrome   . RBBB   . Mild cognitive impairment   . Focal dystonia   . Leukopenia   . Vitamin B12 deficiency   . 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 Appendectomy    . Tonsillectomy    . Cardiac catheterization      normal    Family History  Problem Relation Age of Onset  . CAD Father   . Stroke Other   . Liver cancer Other     Social history:  reports that she has never smoked. She has never used smokeless tobacco. She reports that she does not drink alcohol or use illicit drugs.    Allergies  Allergen Reactions  . Mold  Extract [Trichophyton] Anaphylaxis    sneezing  . Ace Inhibitors Cough  . Codeine Nausea And Vomiting  . Darvon [Propoxyphene] Nausea And Vomiting  . Novocain [Procaine Hcl] Swelling    BP drop  . Xylocaine Jelly [Lidocaine Hcl]     unknown    Medications:  Prior to Admission medications   Medication Sig Start Date End Date Taking? Authorizing Provider  ACCU-CHEK AVIVA PLUS test strip  06/27/14  Yes Historical Provider, MD  acetaminophen (TYLENOL) 500 MG tablet Take 500 mg by mouth every 6 (six) hours as needed.   Yes Historical Provider, MD  amLODipine (NORVASC) 10 MG tablet Take 5 mg by mouth daily.  06/22/14  Yes Historical Provider, MD  aspirin EC 81 MG tablet Take 81 mg by mouth daily.   Yes Historical Provider, MD  bismuth subsalicylate (PEPTO BISMOL) 262 MG/15ML suspension Take 30 mLs by mouth every 6 (six) hours as needed for indigestion or diarrhea or loose stools.   Yes Historical Provider, MD  calcium-vitamin D (OSCAL WITH D) 500-200 MG-UNIT per tablet Take 1 tablet by mouth 3 (three) times daily.   Yes Historical Provider, MD  Carbidopa-Levodopa ER (SINEMET CR) 25-100 MG tablet controlled release Take 1 tablet by mouth 3 (three) times daily. 01/07/15  Yes Historical Provider, MD  cetirizine (ZYRTEC) 10 MG tablet Take 5 mg by mouth daily.   Yes Historical Provider,  MD  cholecalciferol (VITAMIN D) 1000 UNITS tablet Take 1,000 Units by mouth daily.    Yes Historical Provider, MD  glipiZIDE (GLUCOTROL) 5 MG tablet Take 2.5 mg by mouth 2 (two) times daily before a meal.    Yes Historical Provider, MD  hydrochlorothiazide (MICROZIDE) 12.5 MG capsule Take 12.5 mg by mouth daily. 11/28/14  Yes Historical Provider, MD  KRILL OIL PO Take 1 capsule by mouth daily.   Yes Historical Provider, MD  metFORMIN (GLUCOPHAGE) 500 MG tablet Take 1 tablet (500 mg total) by mouth 2 (two) times daily with a meal. Do not restart Metformin until 7/12 am then continue prior home dosing of 1 tablet in am, 1  tablet at noon and 2 tablets in PM Patient taking differently: Take 500 mg by mouth See admin instructions. Take with each meal ( 3 times daily) and before bed 01/04/12  Yes Quintella Reichertraci R Turner, MD  multivitamin-lutein (OCUVITE-LUTEIN) CAPS Take 1 capsule by mouth daily.   Yes Historical Provider, MD  tiZANidine (ZANAFLEX) 2 MG tablet TAKE 1 TABLET IN THE MORNING, 1 AT MIDDAY, 2 IN THE EVENING 10/28/14  Yes York Spanielharles K Alec Mcphee, MD  VESICARE 5 MG tablet Take 2.5 mg by mouth at bedtime. 03/17/15  Yes Historical Provider, MD  vitamin B-12 (CYANOCOBALAMIN) 1000 MCG tablet Take 1,000 mcg by mouth daily.   Yes Historical Provider, MD    ROS:  Out of a complete 14 system review of symptoms, the patient complains only of the following symptoms, and all other reviewed systems are negative.  Difficulty swallowing Cough, choking Heart murmur Vomiting Frequency of urination Muscle cramps, neck stiffness Skin rash, itching Speech difficulty  Blood pressure 193/92, pulse 97, height 5\' 4"  (1.626 m), weight 160 lb 8 oz (72.802 kg).  Physical Exam  General: The patient is alert and cooperative at the time of the examination.  Skin: No significant peripheral edema is noted.   Neurologic Exam  Mental status: The patient is alert and oriented x 3 at the time of the examination. The patient has apparent normal recent and remote memory, with an apparently normal attention span and concentration ability.   Cranial nerves: Facial symmetry is present. Speech is normal, no aphasia or dysarthria is noted. Extraocular movements are full with exception of significant superior gaze paresis. Visual fields are full. Masking the face is noted.  Motor: The patient has good strength in all 4 extremities, but the patient has significant increase in muscle tone involving the left arm, left arm is in flexion.  Sensory examination: Soft touch sensation is symmetric on the face, arms, and legs.  Coordination: The patient has  good finger-nose-finger and heel-to-shin bilaterally, with exception that she has difficulty performing finger-nose-finger with the left arm.  Gait and station: The patient has a slow, deliberate gait, the left arm is in flexion. Tandem gait could not be performed, the patient notes significant imbalance. Romberg is negative. No drift is seen.  Reflexes: Deep tendon reflexes are symmetric.   Assessment/Plan:  1. Cortical basilar degeneration  2. Reports of dysphagia  The patient does not wish to undergo a modified barium swallow at this time, she is to contact our office if the swallowing issues worsen. The patient [facility]. I will add low-dose diazepam to her regimen to help treat the speech issues and the left sided spasticity. The patient will follow-up in 5-6 months. She will contact me if she is having issues with the new medication.  Marlan Palau MD 04/09/2015 1:45 PM  [facility] [address] [place], [place] [address]  Phone [phone] Fax [phone]

## 2015-04-10 ENCOUNTER — Encounter: Payer: Self-pay | Admitting: Neurology

## 2015-04-15 ENCOUNTER — Other Ambulatory Visit: Payer: Self-pay | Admitting: Neurology

## 2015-04-17 DIAGNOSIS — L304 Erythema intertrigo: Secondary | ICD-10-CM | POA: Diagnosis not present

## 2015-05-01 DIAGNOSIS — S32009A Unspecified fracture of unspecified lumbar vertebra, initial encounter for closed fracture: Secondary | ICD-10-CM | POA: Diagnosis not present

## 2015-05-01 DIAGNOSIS — S32000D Wedge compression fracture of unspecified lumbar vertebra, subsequent encounter for fracture with routine healing: Secondary | ICD-10-CM | POA: Diagnosis not present

## 2015-05-01 DIAGNOSIS — Z6827 Body mass index (BMI) 27.0-27.9, adult: Secondary | ICD-10-CM | POA: Diagnosis not present

## 2015-05-01 DIAGNOSIS — I10 Essential (primary) hypertension: Secondary | ICD-10-CM | POA: Diagnosis not present

## 2015-06-08 IMAGING — CR DG CHEST 2V
2 series · 2 of 2 positions shown · non-contrast
Comparison: 12/08/2010.

CLINICAL DATA: Productive cough. Generalized weakness. Current
history of diabetes and hypertension.

EXAM:
CHEST  2 VIEW

[w chest lat]
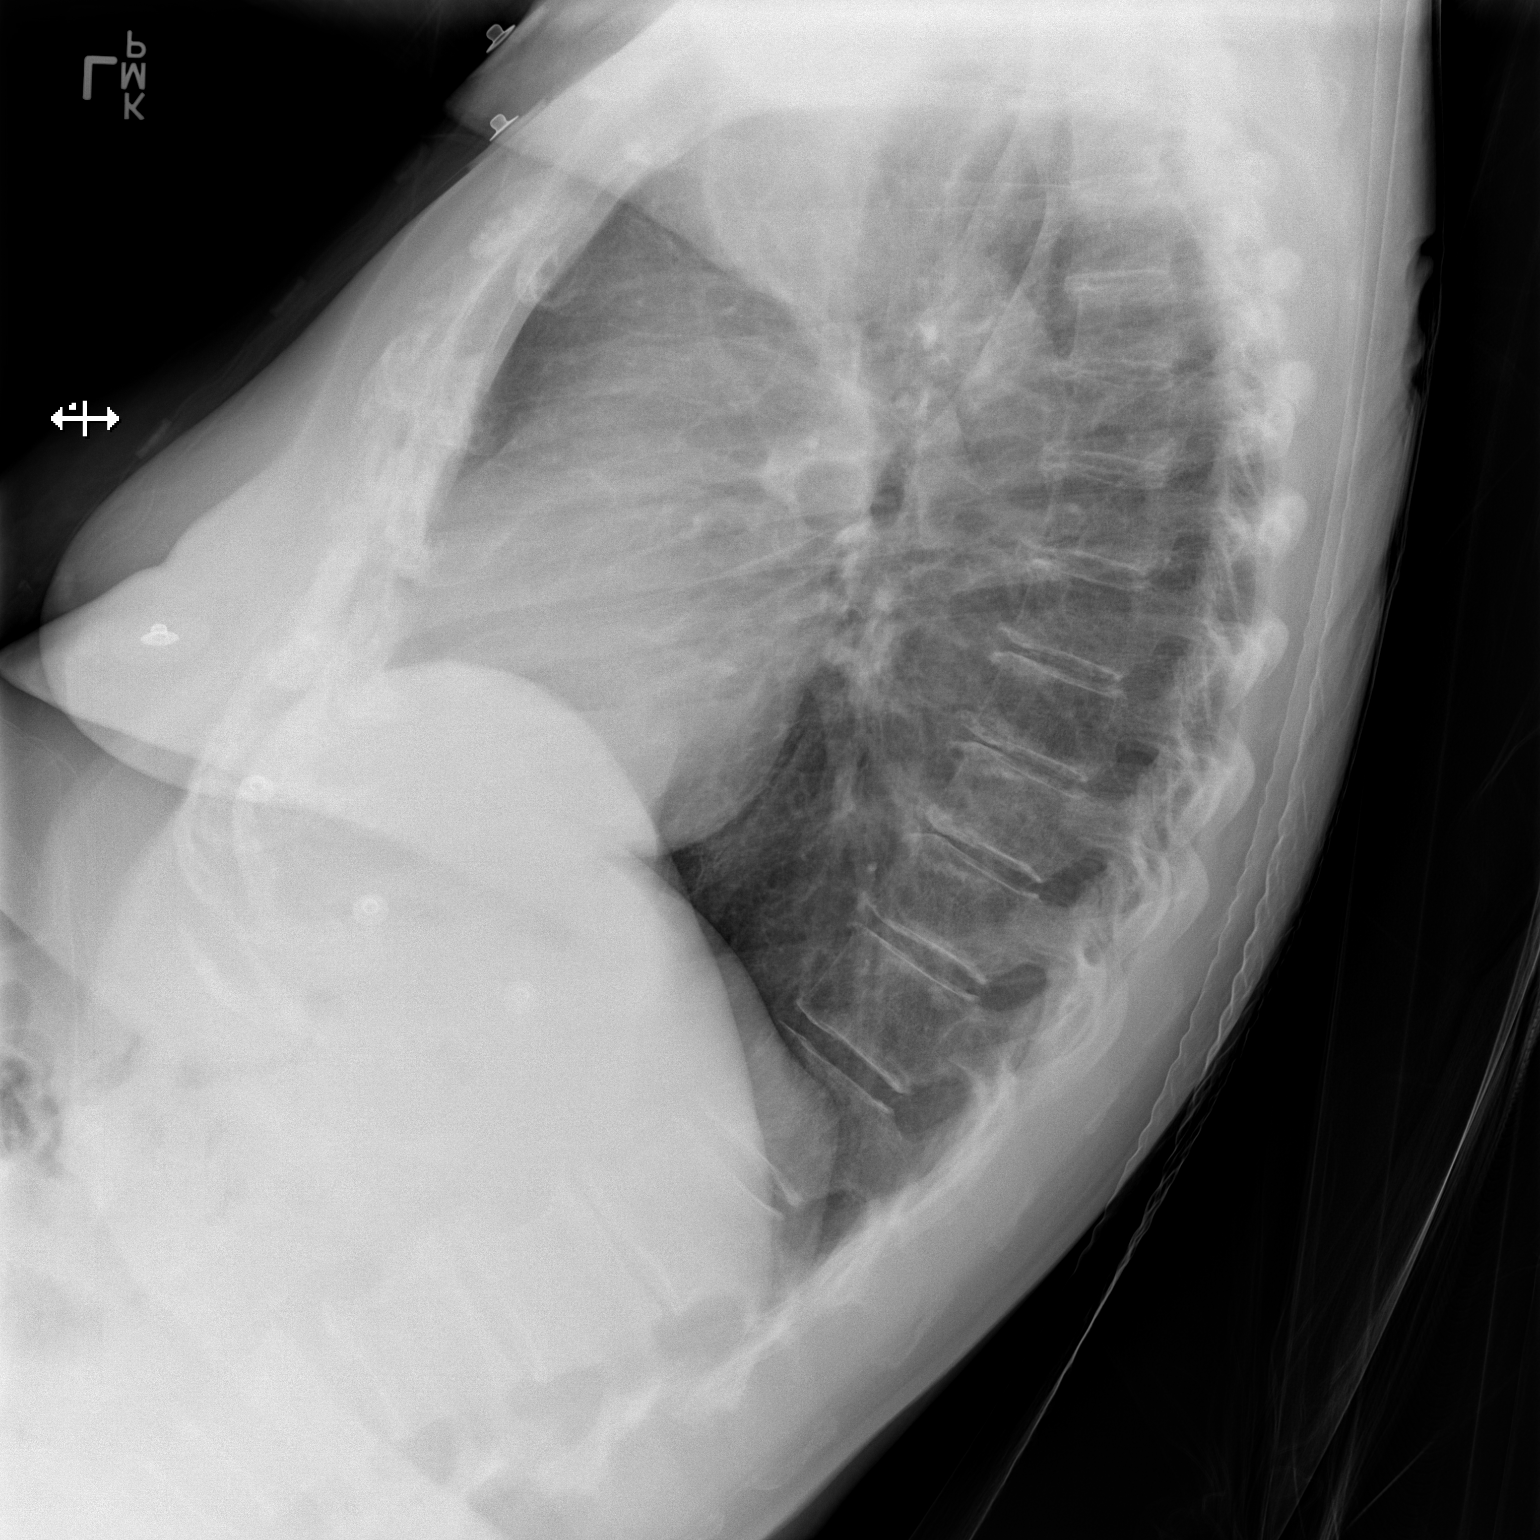

[x chest ap]
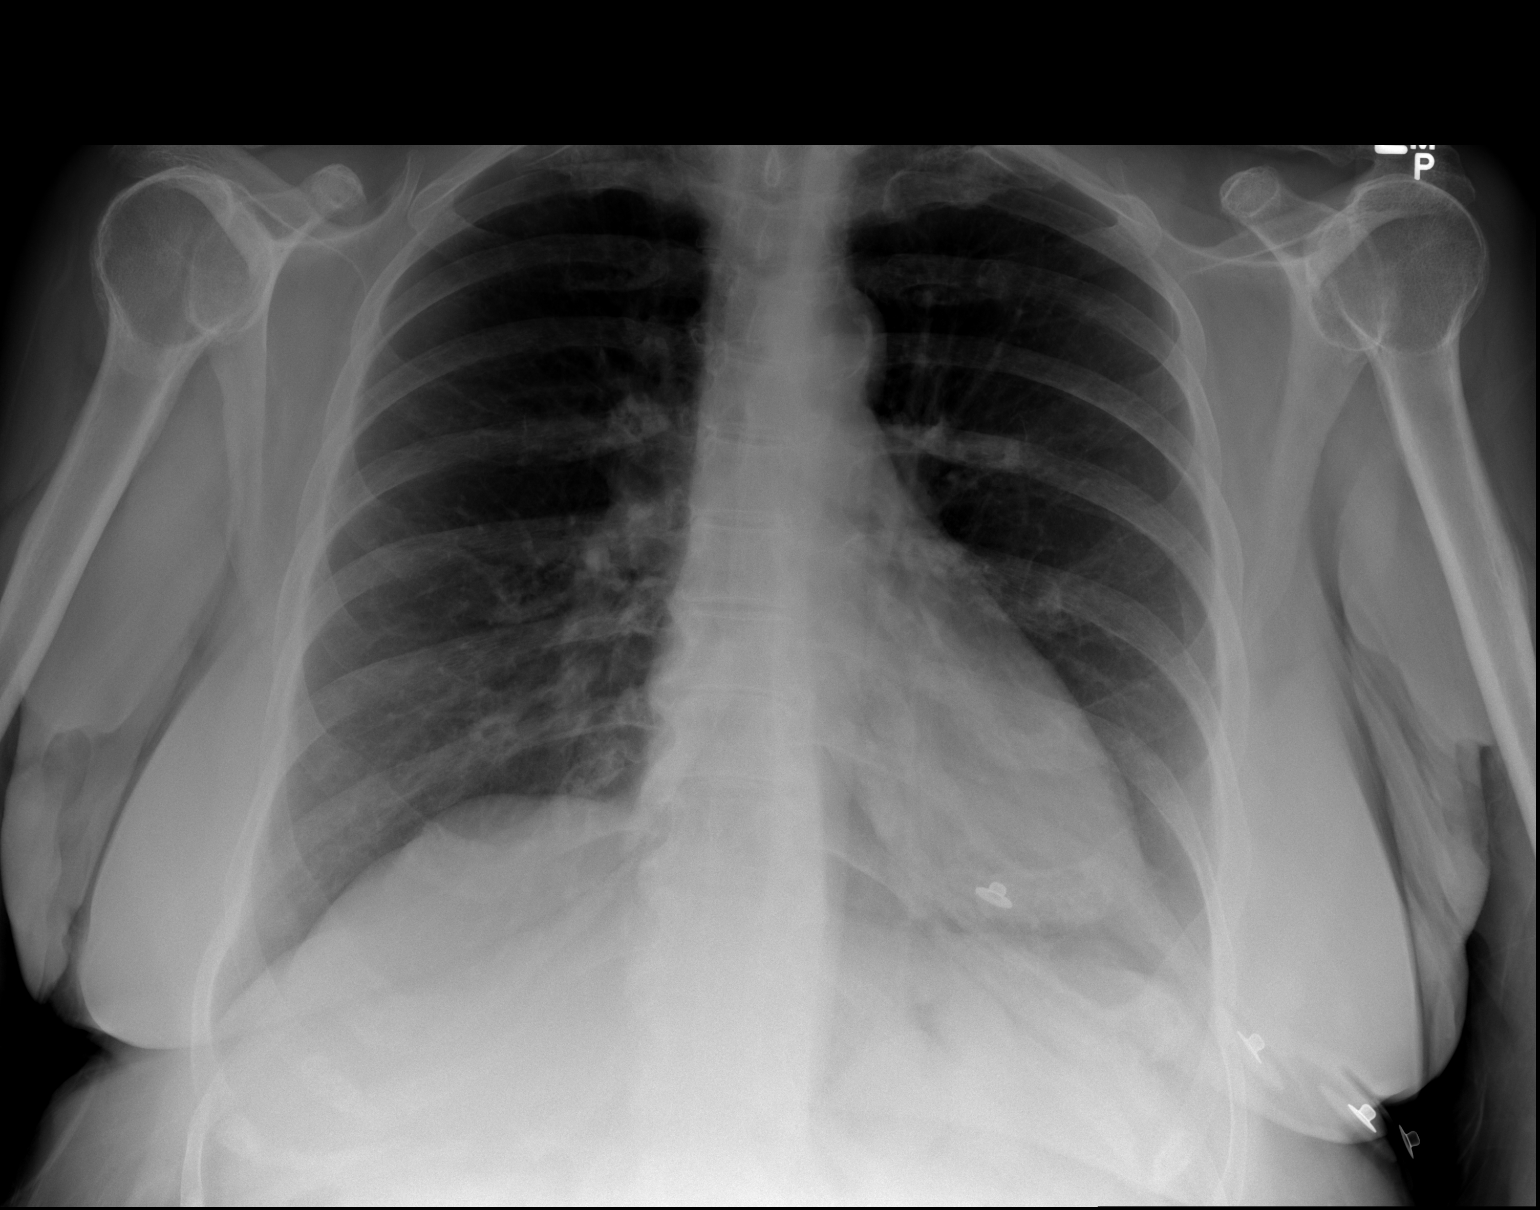

[2 of 2 positions shown; findings below may reference images not displayed]

FINDINGS: Cardiac silhouette upper normal in size for the AP technique.
Thoracic aorta atherosclerotic, unchanged. Hilar and mediastinal
contours otherwise unremarkable. Lungs clear. Bronchovascular
markings normal. Pulmonary vascularity normal. No visible pleural
effusions. No pneumothorax. Degenerative changes involving the
thoracic spine. No significant interval change.
IMPRESSION: No acute cardiopulmonary disease.  Stable examination.

## 2015-06-12 DIAGNOSIS — G248 Other dystonia: Secondary | ICD-10-CM | POA: Diagnosis not present

## 2015-06-12 DIAGNOSIS — G3185 Corticobasal degeneration: Secondary | ICD-10-CM | POA: Diagnosis not present

## 2015-06-12 DIAGNOSIS — G249 Dystonia, unspecified: Secondary | ICD-10-CM | POA: Diagnosis not present

## 2015-06-17 DIAGNOSIS — G3185 Corticobasal degeneration: Secondary | ICD-10-CM | POA: Diagnosis not present

## 2015-06-17 DIAGNOSIS — R2689 Other abnormalities of gait and mobility: Secondary | ICD-10-CM | POA: Diagnosis not present

## 2015-06-19 DIAGNOSIS — G3185 Corticobasal degeneration: Secondary | ICD-10-CM | POA: Diagnosis not present

## 2015-06-19 DIAGNOSIS — R2689 Other abnormalities of gait and mobility: Secondary | ICD-10-CM | POA: Diagnosis not present

## 2015-06-19 IMAGING — US US SOFT TISSUE HEAD/NECK
1 series · 14 of 25 positions shown · non-contrast
Comparison: 03/17/2012

CLINICAL DATA: Thyroid nodule.

EXAM:
THYROID ULTRASOUND
TECHNIQUE: Ultrasound examination of the thyroid gland and adjacent soft
tissues was performed.

[Series 1: us soft tissue head/neck · 0.07mm/px · 14 of 37 slices shown]
[im 1/37]
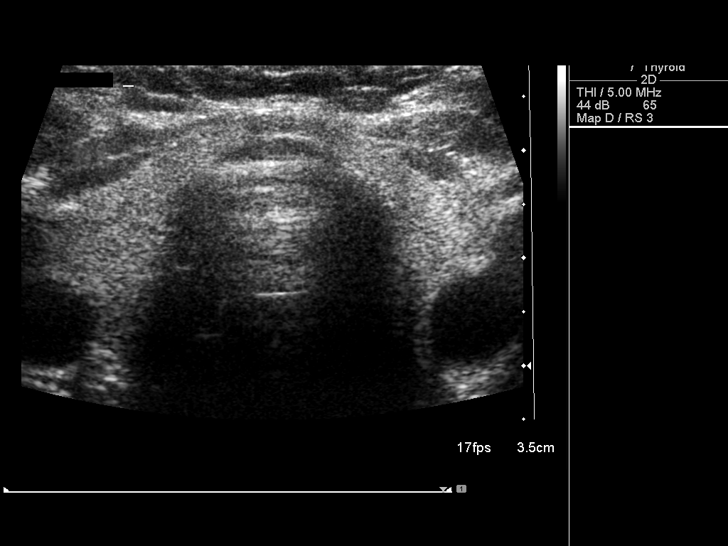
[im 4/37]
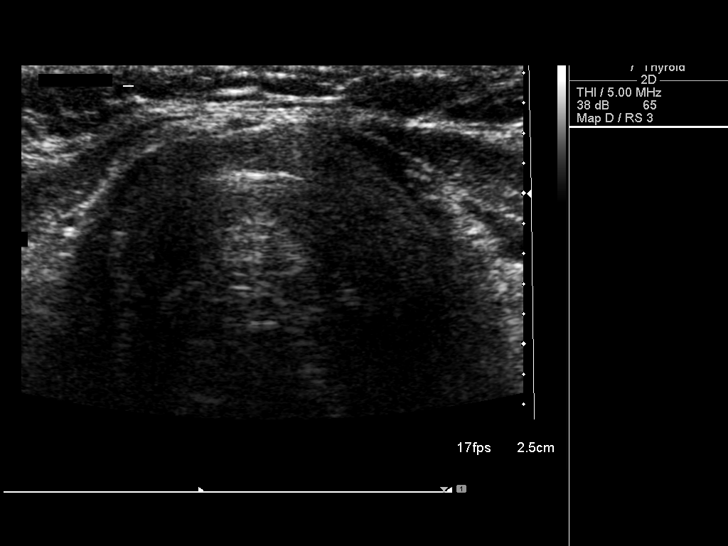
[im 7/37]
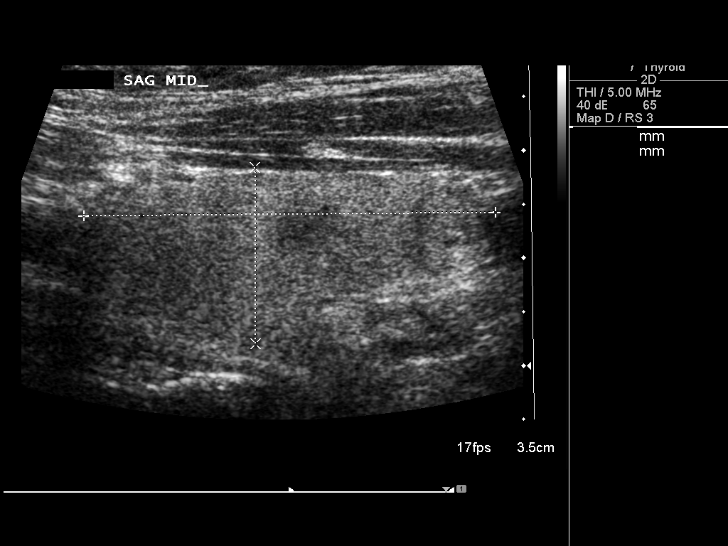
[im 10/37]
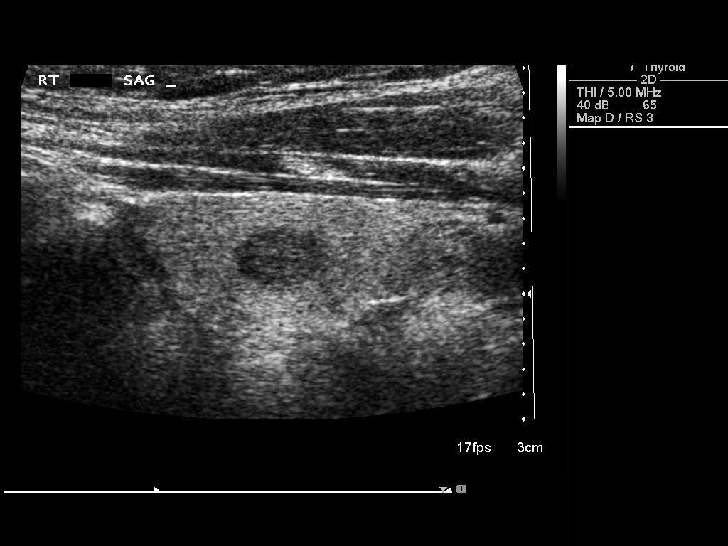
[im 13/37]
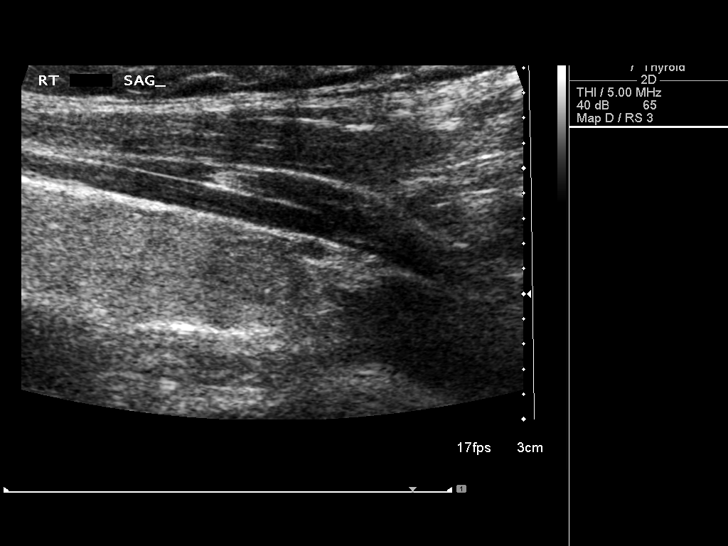
[im 14/37]
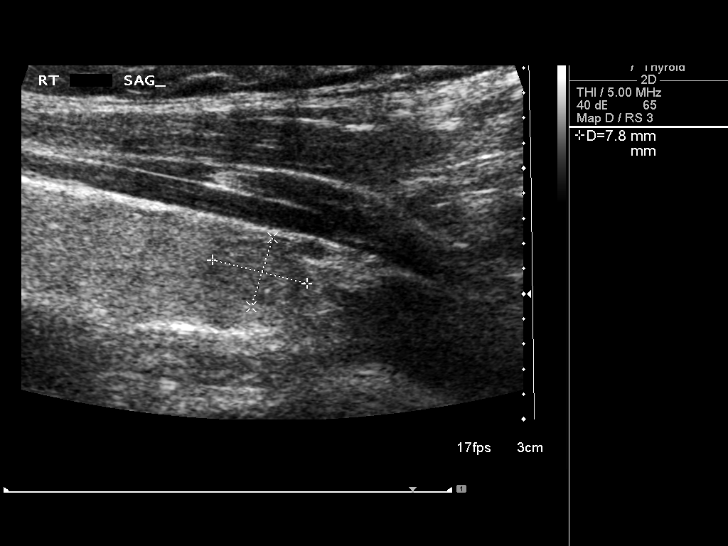
[im 17/37]
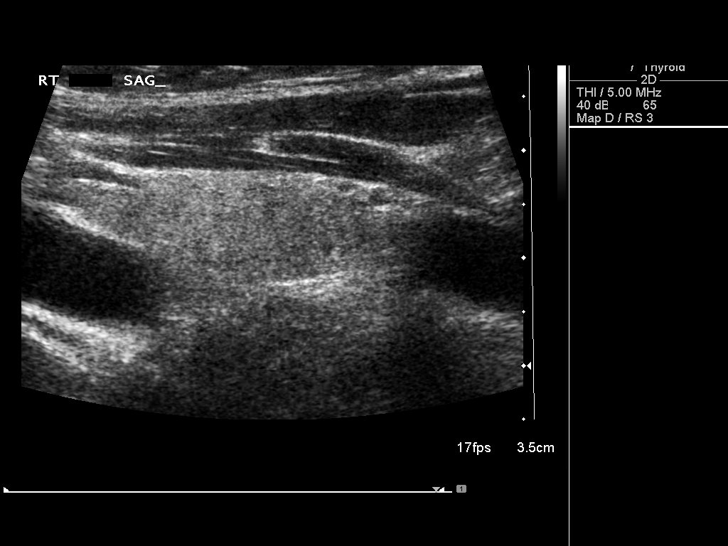
[im 20/37]
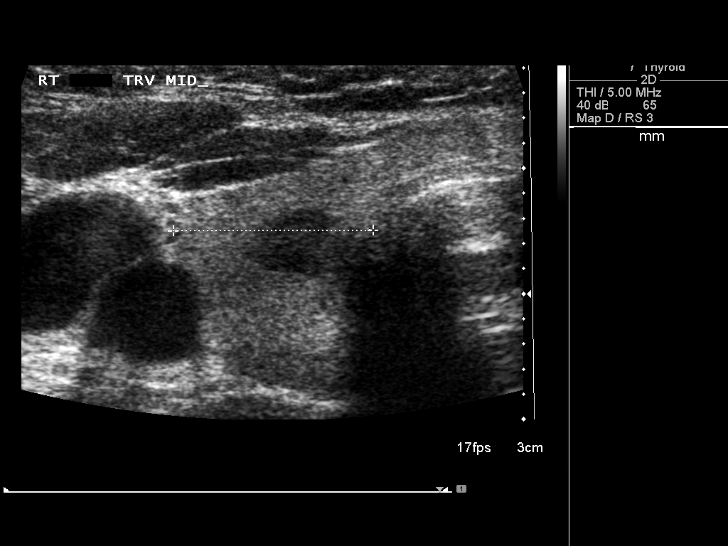
[im 23/37]
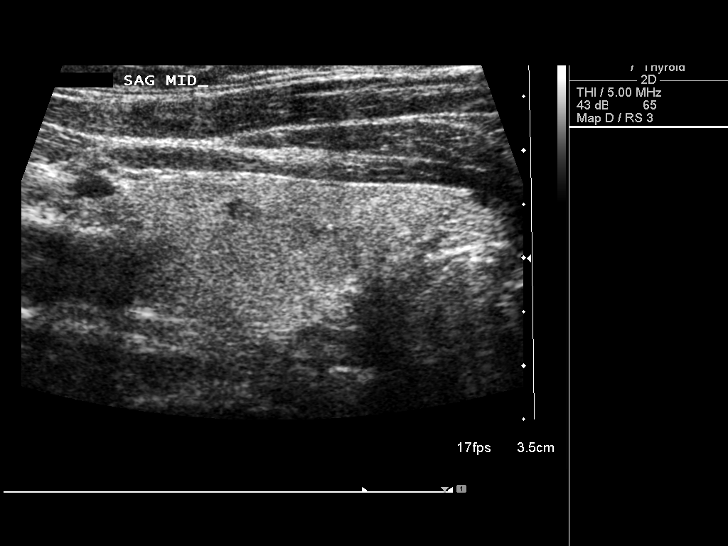
[im 25/37]
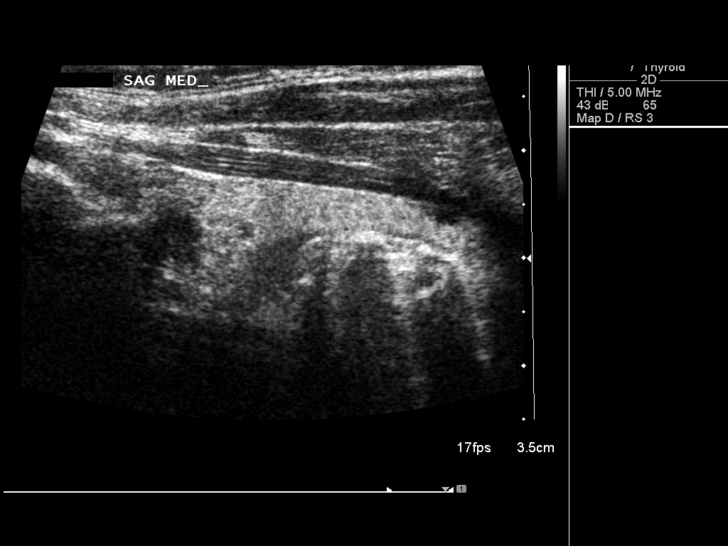
[im 28/37]
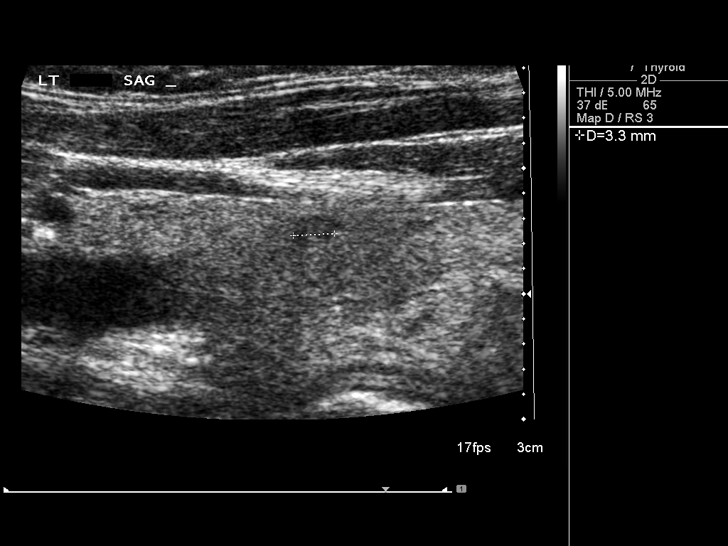
[im 31/37]
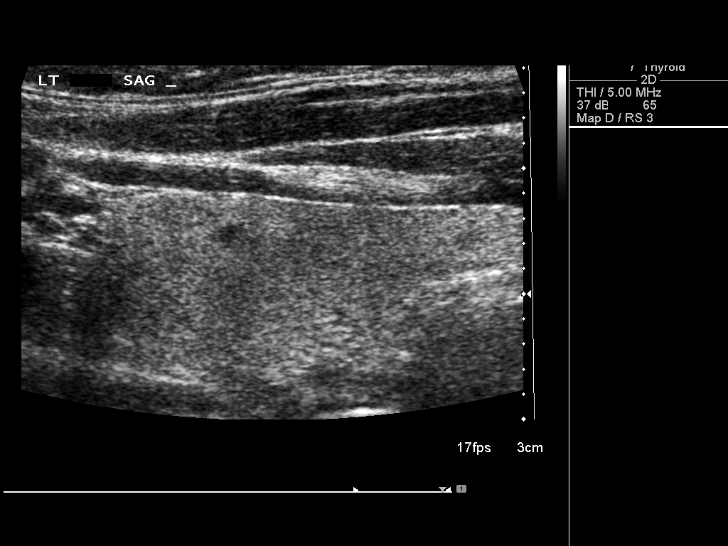
[im 34/37]
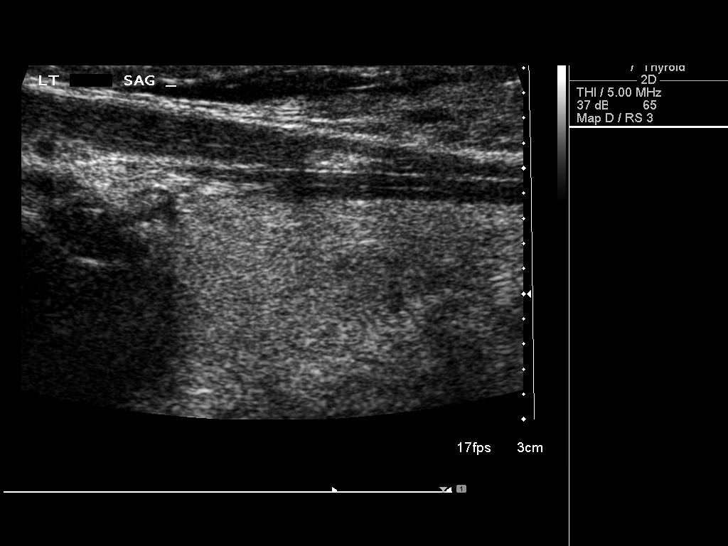
[im 37/37]
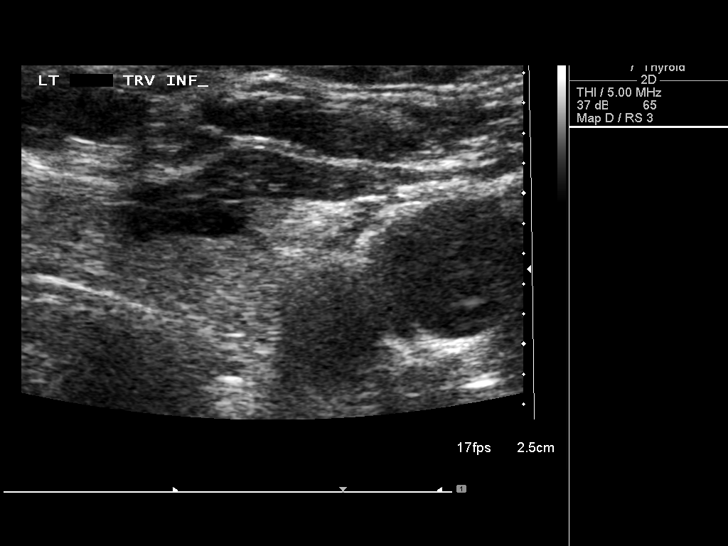

[14 of 25 positions shown; findings below may reference images not displayed]

FINDINGS: Right thyroid lobe

Measurements: 3.8 x 1.6 x 1.8 cm. There is a solid hypoechoic 0.8 x
0.6 x 0.7 cm nodule in the inferior medial aspect of the right lobe.
There is a 0.8 x 0.6 x 0.8 cm solid less hypoechoic nodule in the
tip of the right lower pole.

Left thyroid lobe

Measurements: 3.7 x 1.6 x 1.3 cm. To 3 mm hypoechoic nodules in the
left upper and mid zones.

Isthmus

Thickness: 0.2 cm.  No nodules visualized.

Lymphadenopathy

None visualized.
IMPRESSION: Stable appearance of the thyroid nodules since the prior exam. None
of the nodules meet criteria for fine needle aspiration biopsy at
this time.

## 2015-06-24 DIAGNOSIS — R2689 Other abnormalities of gait and mobility: Secondary | ICD-10-CM | POA: Diagnosis not present

## 2015-06-24 DIAGNOSIS — G3185 Corticobasal degeneration: Secondary | ICD-10-CM | POA: Diagnosis not present

## 2015-06-27 DIAGNOSIS — G3185 Corticobasal degeneration: Secondary | ICD-10-CM | POA: Diagnosis not present

## 2015-06-27 DIAGNOSIS — R2689 Other abnormalities of gait and mobility: Secondary | ICD-10-CM | POA: Diagnosis not present

## 2015-06-28 ENCOUNTER — Encounter: Payer: Self-pay | Admitting: Neurology

## 2015-06-30 DIAGNOSIS — R2689 Other abnormalities of gait and mobility: Secondary | ICD-10-CM | POA: Diagnosis not present

## 2015-06-30 DIAGNOSIS — R279 Unspecified lack of coordination: Secondary | ICD-10-CM | POA: Diagnosis not present

## 2015-06-30 DIAGNOSIS — G3185 Corticobasal degeneration: Secondary | ICD-10-CM | POA: Diagnosis not present

## 2015-07-01 DIAGNOSIS — R2689 Other abnormalities of gait and mobility: Secondary | ICD-10-CM | POA: Diagnosis not present

## 2015-07-01 DIAGNOSIS — R279 Unspecified lack of coordination: Secondary | ICD-10-CM | POA: Diagnosis not present

## 2015-07-01 DIAGNOSIS — G3185 Corticobasal degeneration: Secondary | ICD-10-CM | POA: Diagnosis not present

## 2015-07-03 DIAGNOSIS — I10 Essential (primary) hypertension: Secondary | ICD-10-CM | POA: Diagnosis not present

## 2015-07-03 DIAGNOSIS — E119 Type 2 diabetes mellitus without complications: Secondary | ICD-10-CM | POA: Diagnosis not present

## 2015-07-03 DIAGNOSIS — Z1389 Encounter for screening for other disorder: Secondary | ICD-10-CM | POA: Diagnosis not present

## 2015-07-03 DIAGNOSIS — Z7984 Long term (current) use of oral hypoglycemic drugs: Secondary | ICD-10-CM | POA: Diagnosis not present

## 2015-07-03 DIAGNOSIS — Z79899 Other long term (current) drug therapy: Secondary | ICD-10-CM | POA: Diagnosis not present

## 2015-07-03 DIAGNOSIS — Z0000 Encounter for general adult medical examination without abnormal findings: Secondary | ICD-10-CM | POA: Diagnosis not present

## 2015-07-04 DIAGNOSIS — R2689 Other abnormalities of gait and mobility: Secondary | ICD-10-CM | POA: Diagnosis not present

## 2015-07-04 DIAGNOSIS — R279 Unspecified lack of coordination: Secondary | ICD-10-CM | POA: Diagnosis not present

## 2015-07-04 DIAGNOSIS — G3185 Corticobasal degeneration: Secondary | ICD-10-CM | POA: Diagnosis not present

## 2015-07-08 DIAGNOSIS — G3185 Corticobasal degeneration: Secondary | ICD-10-CM | POA: Diagnosis not present

## 2015-07-08 DIAGNOSIS — R2689 Other abnormalities of gait and mobility: Secondary | ICD-10-CM | POA: Diagnosis not present

## 2015-07-08 DIAGNOSIS — R279 Unspecified lack of coordination: Secondary | ICD-10-CM | POA: Diagnosis not present

## 2015-07-11 DIAGNOSIS — R279 Unspecified lack of coordination: Secondary | ICD-10-CM | POA: Diagnosis not present

## 2015-07-11 DIAGNOSIS — G3185 Corticobasal degeneration: Secondary | ICD-10-CM | POA: Diagnosis not present

## 2015-07-11 DIAGNOSIS — R2689 Other abnormalities of gait and mobility: Secondary | ICD-10-CM | POA: Diagnosis not present

## 2015-07-15 DIAGNOSIS — R279 Unspecified lack of coordination: Secondary | ICD-10-CM | POA: Diagnosis not present

## 2015-07-15 DIAGNOSIS — G3185 Corticobasal degeneration: Secondary | ICD-10-CM | POA: Diagnosis not present

## 2015-07-15 DIAGNOSIS — R2689 Other abnormalities of gait and mobility: Secondary | ICD-10-CM | POA: Diagnosis not present

## 2015-07-16 ENCOUNTER — Telehealth: Payer: Self-pay | Admitting: Neurology

## 2015-07-16 DIAGNOSIS — R1314 Dysphagia, pharyngoesophageal phase: Secondary | ICD-10-CM

## 2015-07-16 DIAGNOSIS — G3185 Corticobasal degeneration: Secondary | ICD-10-CM

## 2015-07-16 NOTE — Telephone Encounter (Signed)
Patient is calling. She has decided that she would like to schedule a Barium Swallow test.

## 2015-07-16 NOTE — Telephone Encounter (Signed)
I called the patient. She is having increasing problems with swallowing, I will get her setup for a modified barium swallow at .

## 2015-07-17 DIAGNOSIS — R2689 Other abnormalities of gait and mobility: Secondary | ICD-10-CM | POA: Diagnosis not present

## 2015-07-17 DIAGNOSIS — G3185 Corticobasal degeneration: Secondary | ICD-10-CM | POA: Diagnosis not present

## 2015-07-17 DIAGNOSIS — R279 Unspecified lack of coordination: Secondary | ICD-10-CM | POA: Diagnosis not present

## 2015-07-22 ENCOUNTER — Other Ambulatory Visit (HOSPITAL_COMMUNITY): Payer: Self-pay | Admitting: Neurology

## 2015-07-22 DIAGNOSIS — R1310 Dysphagia, unspecified: Secondary | ICD-10-CM

## 2015-07-23 ENCOUNTER — Encounter: Payer: Self-pay | Admitting: Podiatry

## 2015-07-23 ENCOUNTER — Ambulatory Visit (INDEPENDENT_AMBULATORY_CARE_PROVIDER_SITE_OTHER): Payer: Medicare Other | Admitting: Podiatry

## 2015-07-23 VITALS — BP 154/82 | HR 69 | Resp 12

## 2015-07-23 DIAGNOSIS — B351 Tinea unguium: Secondary | ICD-10-CM | POA: Diagnosis not present

## 2015-07-23 DIAGNOSIS — M79675 Pain in left toe(s): Secondary | ICD-10-CM | POA: Diagnosis not present

## 2015-07-23 DIAGNOSIS — M79674 Pain in right toe(s): Secondary | ICD-10-CM

## 2015-07-23 NOTE — Patient Instructions (Signed)
Diabetes and Foot Care Diabetes may cause you to have problems because of poor blood supply (circulation) to your feet and legs. This may cause the skin on your feet to become thinner, break easier, and heal more slowly. Your skin may become dry, and the skin may peel and crack. You may also have nerve damage in your legs and feet causing decreased feeling in them. You may not notice minor injuries to your feet that could lead to infections or more serious problems. Taking care of your feet is one of the most important things you can do for yourself.  HOME CARE INSTRUCTIONS  Wear shoes at all times, even in the house. Do not go barefoot. Bare feet are easily injured.  Check your feet daily for blisters, cuts, and redness. If you cannot see the bottom of your feet, use a mirror or ask someone for help.  Wash your feet with warm water (do not use hot water) and mild soap. Then pat your feet and the areas between your toes until they are completely dry. Do not soak your feet as this can dry your skin.  Apply a moisturizing lotion or petroleum jelly (that does not contain alcohol and is unscented) to the skin on your feet and to dry, brittle toenails. Do not apply lotion between your toes.  Trim your toenails straight across. Do not dig under them or around the cuticle. File the edges of your nails with an emery board or nail file.  Do not cut corns or calluses or try to remove them with medicine.  Wear clean socks or stockings every day. Make sure they are not too tight. Do not wear knee-high stockings since they may decrease blood flow to your legs.  Wear shoes that fit properly and have enough cushioning. To break in new shoes, wear them for just a few hours a day. This prevents you from injuring your feet. Always look in your shoes before you put them on to be sure there are no objects inside.  Do not cross your legs. This may decrease the blood flow to your feet.  If you find a minor scrape,  cut, or break in the skin on your feet, keep it and the skin around it clean and dry. These areas may be cleansed with mild soap and water. Do not cleanse the area with peroxide, alcohol, or iodine.  When you remove an adhesive bandage, be sure not to damage the skin around it.  If you have a wound, look at it several times a day to make sure it is healing.  Do not use heating pads or hot water bottles. They may burn your skin. If you have lost feeling in your feet or legs, you may not know it is happening until it is too late.  Make sure your health care provider performs a complete foot exam at least annually or more often if you have foot problems. Report any cuts, sores, or bruises to your health care provider immediately. SEEK MEDICAL CARE IF:   You have an injury that is not healing.  You have cuts or breaks in the skin.  You have an ingrown nail.  You notice redness on your legs or feet.  You feel burning or tingling in your legs or feet.  You have pain or cramps in your legs and feet.  Your legs or feet are numb.  Your feet always feel cold. SEEK IMMEDIATE MEDICAL CARE IF:   There is increasing redness,   swelling, or pain in or around a wound.  There is a red line that goes up your leg.  Pus is coming from a wound.  You develop a fever or as directed by your health care provider.  You notice a bad smell coming from an ulcer or wound.   This information is not intended to replace advice given to you by your health care provider. Make sure you discuss any questions you have with your health care provider.   Document Released: 06/11/2000 Document Revised: 02/14/2013 Document Reviewed: 11/21/2012 Elsevier Interactive Patient Education 2016 Else[employer] Inc.  

## 2015-07-23 NOTE — Progress Notes (Signed)
   Subjective:    Patient ID: Tracy [name], female    DOB: [birth year], 82 y.o.   MRN: [id number]  HPI   This patient presents today complaining of elongated, discolored, deformed toenails that have become progressively thicker and more deformed over time which cause discomfort when she walks and wears shoes. Patient has attempted exam toenails, however not able to reduce the discomfort associated with the nails. She denies any podiatric care. She denies any infection around the nails.  Patient is diabetic and denies history of foot ulceration, claudication or amputation  Review of Systems  Skin: Positive for color change.       Objective:   Physical Exam  Orientated 3  Vascular: Pitting edema bilaterally DP and PT pulses 2/4 bilaterally Capillary reflex immediate bilaterally  Neurological: Sensation to 10 g monofilament wire intact 5/5 bilaterally Vibratory sensation reactive bilaterally Ankle reflex equal and reactive bilaterally  Dermatological: Well-healed surgical scars medial lateral right ankle The toenails are elongated, discolored, incurvated, deformed and tender to direct palpation 6-10  Musculoskeletal: No deformities noted bilaterally There is no pain or crepitus on range of motion of ankle, subtalar, midtarsal joints bilaterally      Assessment & Plan:   Assessment: Satisfactory neurovascular status Diabetic without obvious foot complications Symptomatic onychomycoses 6-10  Plan: Today review the results of the examination with patient today and offered her nail debridement and she verbally consents. The toenails 6-10 were debrided mechanically and electrical without any bleeding   Reappoint 3 months

## 2015-07-24 DIAGNOSIS — R279 Unspecified lack of coordination: Secondary | ICD-10-CM | POA: Diagnosis not present

## 2015-07-24 DIAGNOSIS — G3185 Corticobasal degeneration: Secondary | ICD-10-CM | POA: Diagnosis not present

## 2015-07-24 DIAGNOSIS — R2689 Other abnormalities of gait and mobility: Secondary | ICD-10-CM | POA: Diagnosis not present

## 2015-07-29 DIAGNOSIS — R279 Unspecified lack of coordination: Secondary | ICD-10-CM | POA: Diagnosis not present

## 2015-07-29 DIAGNOSIS — R2689 Other abnormalities of gait and mobility: Secondary | ICD-10-CM | POA: Diagnosis not present

## 2015-07-29 DIAGNOSIS — G3185 Corticobasal degeneration: Secondary | ICD-10-CM | POA: Diagnosis not present

## 2015-08-01 DIAGNOSIS — R279 Unspecified lack of coordination: Secondary | ICD-10-CM | POA: Diagnosis not present

## 2015-08-01 DIAGNOSIS — R2689 Other abnormalities of gait and mobility: Secondary | ICD-10-CM | POA: Diagnosis not present

## 2015-08-01 DIAGNOSIS — G3185 Corticobasal degeneration: Secondary | ICD-10-CM | POA: Diagnosis not present

## 2015-08-04 ENCOUNTER — Ambulatory Visit (HOSPITAL_COMMUNITY)
Admission: RE | Admit: 2015-08-04 | Discharge: 2015-08-04 | Disposition: A | Discharge status: Home / Self Care | Payer: Medicare Other | Source: Ambulatory Visit | Attending: Neurology | Admitting: Neurology

## 2015-08-04 DIAGNOSIS — R1314 Dysphagia, pharyngoesophageal phase: Secondary | ICD-10-CM | POA: Insufficient documentation

## 2015-08-04 DIAGNOSIS — G3185 Corticobasal degeneration: Secondary | ICD-10-CM | POA: Insufficient documentation

## 2015-08-04 DIAGNOSIS — T17300D Unspecified foreign body in larynx causing asphyxiation, subsequent encounter: Secondary | ICD-10-CM | POA: Diagnosis not present

## 2015-08-04 DIAGNOSIS — R938 Abnormal findings on diagnostic imaging of other specified body structures: Secondary | ICD-10-CM | POA: Insufficient documentation

## 2015-08-04 NOTE — Procedures (Signed)
Addendum to MBS from 08/04/2015  Using image on xray, educated pt and her caregiver Ramona to results, recommendations, mitigation strategies and provided exercises to improve function.  Provided effortful swallow and lingual press exercises to improve laryngeal closure and subsequently airway protection.  Also advised pt to keep her "hock" ability strong to help clear vallecular residuals if she has difficulty swallowing foods and senses pharyngeal lodging.  Thanks for this referral.   Donavan Burnet, MS [facility] SLP [phone]

## 2015-08-05 DIAGNOSIS — R2689 Other abnormalities of gait and mobility: Secondary | ICD-10-CM | POA: Diagnosis not present

## 2015-08-05 DIAGNOSIS — G3185 Corticobasal degeneration: Secondary | ICD-10-CM | POA: Diagnosis not present

## 2015-08-05 DIAGNOSIS — R279 Unspecified lack of coordination: Secondary | ICD-10-CM | POA: Diagnosis not present

## 2015-08-06 DIAGNOSIS — E119 Type 2 diabetes mellitus without complications: Secondary | ICD-10-CM | POA: Diagnosis not present

## 2015-08-06 DIAGNOSIS — H2513 Age-related nuclear cataract, bilateral: Secondary | ICD-10-CM | POA: Diagnosis not present

## 2015-08-21 ENCOUNTER — Encounter: Payer: Self-pay | Admitting: Podiatry

## 2015-08-21 ENCOUNTER — Ambulatory Visit (INDEPENDENT_AMBULATORY_CARE_PROVIDER_SITE_OTHER): Payer: Medicare Other | Admitting: Podiatry

## 2015-08-21 VITALS — BP 174/87 | HR 99 | Resp 14

## 2015-08-21 DIAGNOSIS — L03031 Cellulitis of right toe: Secondary | ICD-10-CM

## 2015-08-21 DIAGNOSIS — L03011 Cellulitis of right finger: Secondary | ICD-10-CM

## 2015-08-21 DIAGNOSIS — L600 Ingrowing nail: Secondary | ICD-10-CM

## 2015-08-21 NOTE — Progress Notes (Signed)
Subjective:     Patient ID: Tracy [name], female   DOB: 13-May-1934, 82 y.o.   MRN: [id number]  HPI this patient returns to the office with chief complaint of a painful ingrown toenail on the outside border big toe, right foot. She says pain has been present for approximately 2 weeks and she has difficulty walking and wearing her shoes. He states the area has become red and inflamed. She presents the office for an evaluation and treatment of this condition   Review of Systems     Objective:   Physical Exam GENERAL APPEARANCE: Alert, conversant. Appropriately groomed. No acute distress.  VASCULAR: Pedal pulses palpable at  [facility] and PT bilateral.  Capillary refill time is immediate to all digits,  Normal temperature gradient.  Digital hair growth is present bilateral  NEUROLOGIC: sensation is normal to 5.07 monofilament at 5/5 sites bilateral.  Light touch is intact bilateral, Muscle strength normal.  MUSCULOSKELETAL: acceptable muscle strength, tone and stability bilateral.  Intrinsic muscluature intact bilateral.  Rectus appearance of foot and digits noted bilateral.   DERMATOLOGIC: skin color, texture, and turgor are within normal limits.  No preulcerative lesions or ulcers  are seen, no interdigital maceration noted.  No open lesions present.   No drainage noted.  NAILS  Marked incurvation noted lateral border right hallux with redness and swelling at lateral nail border.      Assessment:     Ingrown Toenail right hallux   Paronychia right hallux.     Plan:     Incision and Drainage right hallux.  DSD applied.   Helane Gunther DPM

## 2015-09-19 DIAGNOSIS — G3185 Corticobasal degeneration: Secondary | ICD-10-CM | POA: Diagnosis not present

## 2015-09-19 DIAGNOSIS — R1310 Dysphagia, unspecified: Secondary | ICD-10-CM | POA: Diagnosis not present

## 2015-09-19 DIAGNOSIS — G248 Other dystonia: Secondary | ICD-10-CM | POA: Diagnosis not present

## 2015-09-19 DIAGNOSIS — R2689 Other abnormalities of gait and mobility: Secondary | ICD-10-CM | POA: Diagnosis not present

## 2015-09-23 DIAGNOSIS — N390 Urinary tract infection, site not specified: Secondary | ICD-10-CM | POA: Diagnosis not present

## 2015-10-15 ENCOUNTER — Ambulatory Visit (INDEPENDENT_AMBULATORY_CARE_PROVIDER_SITE_OTHER): Payer: Medicare Other | Admitting: Neurology

## 2015-10-15 ENCOUNTER — Encounter: Payer: Self-pay | Admitting: Neurology

## 2015-10-15 VITALS — BP 176/87 | HR 85

## 2015-10-15 DIAGNOSIS — G3185 Corticobasal degeneration: Secondary | ICD-10-CM | POA: Diagnosis not present

## 2015-10-15 NOTE — Progress Notes (Signed)
Reason for visit: Corticobasal degeneration  Tracy [name] is an 82 y.o. female  History of present illness:  Tracy [name] is an 82 year old right-handed white female with a history of corticobasal degeneration. The patient is also followed through [facility] with Dr. Dorina Hoyer. The patient is getting Botox injections on the left hand for spasticity every 3 months, the patient will follow-up again in May 2017. The patient is on diazepam and tizanidine. She believes the tizanidine lowers her blood sugars. The patient never went on the full dose of the diazepam or fear of gait instability. She takes about an 3 mg dose daily, instead of the full 6 mg a day. The patient has not had any falls, she uses a walker for ambulation. She has been sent for a swallowing evaluation that showed mild oropharyngeal dysphagia, they recommended soft food and small bites. The patient has an Geophysicist/field seismologist who comes in every day for 2 or 3 hours and helps her with her food, bathing and making her bed. The patient has a lift chair at home. She returns to this office for an evaluation. She is slowly progressing, having increasing difficulty with speech and with the use of the left arm. The patient has a massage therapist who comes in to help stretch the arm once a week, she has incomplete abduction of the left arm at the shoulder, and cannot fully extend at the left elbow.   Past Medical History  Diagnosis Date  . Diabetes mellitus   . Fibromyalgia   . Fibrocystic breast disease   . Hypertension   . GERD (gastroesophageal reflux disease)   . Carpal tunnel syndrome   . RBBB   . Mild cognitive impairment   . Focal dystonia   . Leukopenia   . Vitamin B12 deficiency   . 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 Appendectomy    . Tonsillectomy    . Cardiac catheterization      normal    Family History  Problem Relation Age of Onset  . CAD Father   . Stroke Other   .  Liver cancer Other     Social history:  reports that she has never smoked. She has never used smokeless tobacco. She reports that she does not drink alcohol or use illicit drugs.    Allergies  Allergen Reactions  . Mold Extract [Trichophyton] Anaphylaxis    sneezing  . Ace Inhibitors Cough  . Codeine Nausea And Vomiting  . Darvon [Propoxyphene] Nausea And Vomiting and Other (See Comments)  . Novocain [Procaine Hcl] Swelling    BP drop  . Xylocaine Jelly [Lidocaine Hcl]     unknown    Medications:  Prior to Admission medications   Medication Sig Start Date End Date Taking? Authorizing Provider  ACCU-CHEK AVIVA PLUS test strip  06/27/14   Historical Provider, MD  acetaminophen (TYLENOL) 500 MG tablet Take 500 mg by mouth every 6 (six) hours as needed.    Historical Provider, MD  amLODipine (NORVASC) 10 MG tablet Take 5 mg by mouth daily.  06/22/14   Historical Provider, MD  aspirin EC 81 MG tablet Take 81 mg by mouth daily.    Historical Provider, MD  bismuth subsalicylate (PEPTO BISMOL) 262 MG/15ML suspension Take 30 mLs by mouth every 6 (six) hours as needed for indigestion or diarrhea or loose stools.    Historical Provider, MD  calcium-vitamin D (OSCAL WITH D) 500-200 MG-UNIT per tablet Take 1  tablet by mouth 3 (three) times daily.    Historical Provider, MD  Carbidopa-Levodopa ER (SINEMET CR) 25-100 MG tablet controlled release  08/18/15   Historical Provider, MD  cetirizine (ZYRTEC) 10 MG tablet Take 5 mg by mouth daily.    Historical Provider, MD  cholecalciferol (VITAMIN D) 1000 UNITS tablet Take 1,000 Units by mouth daily.     Historical Provider, MD  diazepam (VALIUM) 2 MG tablet One tablet twice a day for 2 weeks, then take 1 tablet three times a day 04/09/15   York Spanielharles K Gerad Cornelio, MD  glipiZIDE (GLUCOTROL) 5 MG tablet Take 2.5 mg by mouth 2 (two) times daily before a meal.     Historical Provider, MD  hydrochlorothiazide (MICROZIDE) 12.5 MG capsule  08/30/14   Historical  Provider, MD  KRILL OIL PO Take 1 capsule by mouth daily.    Historical Provider, MD  metFORMIN (GLUCOPHAGE) 500 MG tablet Take 1 tablet (500 mg total) by mouth 2 (two) times daily with a meal. Do not restart Metformin until 7/12 am then continue prior home dosing of 1 tablet in am, 1 tablet at noon and 2 tablets in PM Patient taking differently: Take 500 mg by mouth See admin instructions. Take with each meal ( 3 times daily) and before bed 01/04/12   Quintella Reichertraci R Turner, MD  multivitamin-lutein [facility](OCUVITE-LUTEIN) CAPS Take 1 capsule by mouth daily.    Historical Provider, MD  tiZANidine (ZANAFLEX) 2 MG tablet TAKE 1 TABLET IN THE MORNING, 1 AT MIDDAY, 2 IN THE EVENING 04/15/15   York Spanielharles K Michiah Mudry, MD  VESICARE 5 MG tablet Take 2.5 mg by mouth at bedtime. 03/17/15   Historical Provider, MD  vitamin B-12 (CYANOCOBALAMIN) 1000 MCG tablet Take 1,000 mcg by mouth daily.    Historical Provider, MD    ROS:  Out of a complete 14 system review of symptoms, the patient complains only of the following symptoms, and all other reviewed systems are negative.  Speech difficulty  Arm weakness    Blood pressure 176/87, pulse 85.  Physical Exam  General: The patient is alert and cooperative at the time of the examination.  Skin: No significant peripheral edema is noted.   Neurologic Exam  Mental status: The patient is alert and oriented x 3 at the time of the examination. The patient has apparent normal recent and remote memory, with an apparently normal attention span and concentration ability.   Cranial nerves: Facial symmetry is present. Speech is normal, no aphasia or dysarthria is noted. Extraocular movements are full In the horizontal plane, patient has some difficulty with superior deviation of the eyes. Visual fields are full.  Motor: The patient has good strength in on the right arm and right leg, the left leg is normal or near-normal and strength. With the left arm, the arm is in flexion, the  patient has 4/5 grip strength, as incomplete ability to straighten the left arm, or to elevate the arm above the head. Motor tone is significantly increase with the left arm.  Sensory examination: Soft touch sensation is symmetric on the face, arms, and legs.  Coordination: The patient has good finger-nose-finger and heel-to-shin bilaterally, but the patient has difficulty performing finger-nose-finger with the left arm.  Gait and station: The patient has a slightly wide-based, unsteady gait. Tandem gait was not attempted. Romberg is negative.  Reflexes: Deep tendon reflexes are symmetric.   Assessment/Plan:  1. Corticobasal degeneration   2. Gait disorder   The patient continues to progress slowly.  She will increase the diazepam to 2 mg 3 times daily. She will continue on the tizanidine, and she will follow-up in this office in about 6 months, sooner if needed. The patient is also followed through [facility].   Marlan Palau MD 10/15/2015 7:43 PM  [facility] [address] [place], [place] [address]  Phone [phone] Fax [phone]

## 2015-11-12 ENCOUNTER — Ambulatory Visit (INDEPENDENT_AMBULATORY_CARE_PROVIDER_SITE_OTHER): Payer: Medicare Other | Admitting: Podiatry

## 2015-11-12 ENCOUNTER — Other Ambulatory Visit: Payer: Self-pay | Admitting: Neurology

## 2015-11-12 ENCOUNTER — Encounter: Payer: Self-pay | Admitting: Podiatry

## 2015-11-12 DIAGNOSIS — M79675 Pain in left toe(s): Secondary | ICD-10-CM | POA: Diagnosis not present

## 2015-11-12 DIAGNOSIS — B351 Tinea unguium: Secondary | ICD-10-CM | POA: Diagnosis not present

## 2015-11-12 DIAGNOSIS — M79674 Pain in right toe(s): Secondary | ICD-10-CM | POA: Diagnosis not present

## 2015-11-12 NOTE — Progress Notes (Signed)
Subjective:     Patient ID: Tracy AlimentNancy P [name], female   DOB: [birth year], 82 y.o.   MRN: [id number]  HPI this patient returns to the office with chief complaint Of pain big toe, right foot. She had been treated for a paronychia 3 months ago and she states that her toe has become thickened and disfigured but no pain or drainage or redness noted from the great toe, right foot. She says her nails are painful as she walks and wears her shoes. This patient is a diabetic. She presents the office for preventative foot care services   Review of Systems     Objective:   Physical Exam GENERAL APPEARANCE: Alert, conversant. Appropriately groomed. No acute distress.  VASCULAR: [facility]  [facility].  Capillary refill time is immediate to all digits,  Normal temperature gradient.  Digital hair growth is present bilateral  NEUROLOGIC: sensation is normal to 5.07 monofilament at 5/5 sites bilateral.  Light touch is intact bilateral, Muscle strength normal.  MUSCULOSKELETAL: acceptable muscle strength, tone and stability bilateral.  Intrinsic muscluature intact bilateral.  Rectus appearance of foot and digits noted bilateral.   DERMATOLOGIC: skin color, texture, and turgor are within normal limits.  No preulcerative lesions or ulcers  are seen, no interdigital maceration noted.  No open lesions present.   No drainage noted.  NAILS  Thick disfigured discolored nails right hallux.  No evidence of infection noted..      Assessment:     Onychomycosis right hallux.     Plan:     Debridement and grinding of long painful nail right hallux.  Dispense formula 3 for home usage.  RTC 3 months.   Helane GuntherGregory Shakeila Pfarr DPM

## 2015-11-26 DIAGNOSIS — Z884 Allergy status to anesthetic agent status: Secondary | ICD-10-CM | POA: Diagnosis not present

## 2015-11-26 DIAGNOSIS — G248 Other dystonia: Secondary | ICD-10-CM | POA: Diagnosis not present

## 2015-11-26 DIAGNOSIS — G20 Parkinson's disease: Secondary | ICD-10-CM | POA: Diagnosis not present

## 2015-11-26 DIAGNOSIS — I10 Essential (primary) hypertension: Secondary | ICD-10-CM | POA: Diagnosis not present

## 2015-11-26 DIAGNOSIS — E119 Type 2 diabetes mellitus without complications: Secondary | ICD-10-CM | POA: Diagnosis not present

## 2015-11-26 DIAGNOSIS — E782 Mixed hyperlipidemia: Secondary | ICD-10-CM | POA: Diagnosis not present

## 2015-11-26 DIAGNOSIS — Z885 Allergy status to narcotic agent status: Secondary | ICD-10-CM | POA: Diagnosis not present

## 2015-11-26 DIAGNOSIS — G3185 Corticobasal degeneration: Secondary | ICD-10-CM | POA: Diagnosis not present

## 2015-11-27 DIAGNOSIS — G249 Dystonia, unspecified: Secondary | ICD-10-CM | POA: Diagnosis not present

## 2015-11-27 DIAGNOSIS — G248 Other dystonia: Secondary | ICD-10-CM | POA: Diagnosis not present

## 2015-12-02 ENCOUNTER — Other Ambulatory Visit: Payer: Self-pay | Admitting: Neurology

## 2015-12-04 NOTE — Telephone Encounter (Signed)
Rx printed, signed, faxed to pharmacy. 

## 2016-01-08 DIAGNOSIS — Z79899 Other long term (current) drug therapy: Secondary | ICD-10-CM | POA: Diagnosis not present

## 2016-01-08 DIAGNOSIS — Z7984 Long term (current) use of oral hypoglycemic drugs: Secondary | ICD-10-CM | POA: Diagnosis not present

## 2016-01-08 DIAGNOSIS — I10 Essential (primary) hypertension: Secondary | ICD-10-CM | POA: Diagnosis not present

## 2016-01-08 DIAGNOSIS — E119 Type 2 diabetes mellitus without complications: Secondary | ICD-10-CM | POA: Diagnosis not present

## 2016-01-08 DIAGNOSIS — Z7189 Other specified counseling: Secondary | ICD-10-CM | POA: Diagnosis not present

## 2016-01-19 ENCOUNTER — Telehealth: Payer: Self-pay | Admitting: Neurology

## 2016-01-19 DIAGNOSIS — G3185 Corticobasal degeneration: Secondary | ICD-10-CM

## 2016-01-19 NOTE — Telephone Encounter (Signed)
I will be happy to make the referral.

## 2016-01-19 NOTE — Telephone Encounter (Signed)
Tracy [name] with [facility] is calling and states they are in need of a referral for this patient. They have received medical records.  Phone:  [phone].  Fax:  [phone]

## 2016-02-04 ENCOUNTER — Ambulatory Visit (INDEPENDENT_AMBULATORY_CARE_PROVIDER_SITE_OTHER): Payer: Medicare Other | Admitting: Podiatry

## 2016-02-04 DIAGNOSIS — M79674 Pain in right toe(s): Secondary | ICD-10-CM | POA: Diagnosis not present

## 2016-02-04 DIAGNOSIS — M79675 Pain in left toe(s): Secondary | ICD-10-CM

## 2016-02-04 DIAGNOSIS — B351 Tinea unguium: Secondary | ICD-10-CM | POA: Diagnosis not present

## 2016-02-04 NOTE — Progress Notes (Signed)
Subjective:     Patient ID: Tracy [name], female   DOB: 1933-08-12, 82 y.o.   MRN: [id number]  HPI this patient returns to the office with chief complaint Of pain big toe, right foot. She had been treated for a paronychia 3 months ago and she states that her toe has become thickened and disfigured but no pain or drainage or redness noted from the great toe, right foot. She says her nails are painful as she walks and wears her shoes. This patient is a diabetic. She presents the office for preventative foot care services   Review of Systems     Objective:   Physical Exam GENERAL APPEARANCE: Alert, conversant. Appropriately groomed. No acute distress.  VASCULAR: [facility].  Capillary refill time is immediate to all digits,  Normal temperature gradient.  Digital hair growth is present bilateral  NEUROLOGIC: sensation is normal to 5.07 monofilament at 5/5 sites bilateral.  Light touch is intact bilateral, Muscle strength normal.  MUSCULOSKELETAL: acceptable muscle strength, tone and stability bilateral.  Intrinsic muscluature intact bilateral.  Rectus appearance of foot and digits noted bilateral.   DERMATOLOGIC: skin color, texture, and turgor are within normal limits.  No preulcerative lesions or ulcers  are seen, no interdigital maceration noted.  No open lesions present.   No drainage noted.  NAILS  Thick disfigured discolored nails right hallux.  No evidence of infection noted..      Assessment:     Onychomycosis right hallux.     Plan:     Debridement and grinding of long painful nail right hallux.  Continue using Formula 3.  RTC 3 months.   Helane GuntherGregory Melanie Pellot DPM

## 2016-03-04 DIAGNOSIS — G248 Other dystonia: Secondary | ICD-10-CM | POA: Diagnosis not present

## 2016-03-04 DIAGNOSIS — Z79899 Other long term (current) drug therapy: Secondary | ICD-10-CM | POA: Diagnosis not present

## 2016-03-08 ENCOUNTER — Telehealth: Payer: Self-pay | Admitting: Neurology

## 2016-03-08 NOTE — Telephone Encounter (Signed)
Pt called in requesting a call back about her referral to see another physician pertaining to her disorder. She would like to have more information.

## 2016-03-08 NOTE — Telephone Encounter (Signed)
I called the patient. The patient [facility] was [facility]. She was denied for the study, there is no reason to keep the referral appointment.

## 2016-03-08 NOTE — Telephone Encounter (Signed)
Returned pt Tracy [name]. Reports that "she received a call to schedule a referral but was previously [facility]. However, referral was sent to the [facility] clinic as a second opinion. Pt would like to keep her appt w/ Dr. Anne HahnWillis in November and [facility]. Says that she didn't ask for a second opinion.

## 2016-03-18 DIAGNOSIS — Z7984 Long term (current) use of oral hypoglycemic drugs: Secondary | ICD-10-CM | POA: Diagnosis not present

## 2016-03-18 DIAGNOSIS — I10 Essential (primary) hypertension: Secondary | ICD-10-CM | POA: Diagnosis not present

## 2016-03-18 DIAGNOSIS — R600 Localized edema: Secondary | ICD-10-CM | POA: Diagnosis not present

## 2016-03-18 DIAGNOSIS — E119 Type 2 diabetes mellitus without complications: Secondary | ICD-10-CM | POA: Diagnosis not present

## 2016-04-08 DIAGNOSIS — I10 Essential (primary) hypertension: Secondary | ICD-10-CM | POA: Diagnosis not present

## 2016-04-08 DIAGNOSIS — Z79899 Other long term (current) drug therapy: Secondary | ICD-10-CM | POA: Diagnosis not present

## 2016-04-08 DIAGNOSIS — E119 Type 2 diabetes mellitus without complications: Secondary | ICD-10-CM | POA: Diagnosis not present

## 2016-04-08 DIAGNOSIS — R600 Localized edema: Secondary | ICD-10-CM | POA: Diagnosis not present

## 2016-04-28 ENCOUNTER — Ambulatory Visit (INDEPENDENT_AMBULATORY_CARE_PROVIDER_SITE_OTHER): Payer: Medicare Other | Admitting: Neurology

## 2016-04-28 ENCOUNTER — Encounter: Payer: Self-pay | Admitting: Neurology

## 2016-04-28 VITALS — BP 162/80 | HR 96 | Ht 64.0 in | Wt 160.5 lb

## 2016-04-28 DIAGNOSIS — R269 Unspecified abnormalities of gait and mobility: Secondary | ICD-10-CM | POA: Diagnosis not present

## 2016-04-28 DIAGNOSIS — G3185 Corticobasal degeneration: Secondary | ICD-10-CM | POA: Diagnosis not present

## 2016-04-28 DIAGNOSIS — R1314 Dysphagia, pharyngoesophageal phase: Secondary | ICD-10-CM

## 2016-04-28 NOTE — Progress Notes (Signed)
Reason for visit: Corticobasal degeneration  Tracy [name] is an 82 y.o. female  History of present illness:  [name] is an 82 year old right-handed white female with a history of corticobasal degeneration. The patient is [facility], she is getting Botox injections for the left arm spasticity. The patient could not take diazepam for spasticity as it results in significant gait instability and risk for falls. The patient has had progressive worsening of her speech and swallowing. She had a swallow evaluation in February 2017 that showed evidence of mild oral pharyngeal dysphagia, a mechanical soft diet with thin liquids was recommended. The patient has had worsening choking problems over time, she will have some coughing with each meal. She may report some discomfort in the mid chest level with eating a meal. She walks with a walker, she has not had any recent falls. The patient denies any memory problems. She has had progressive worsening of spasticity with the left arm and the left leg to some degree. She returns for an evaluation.  Past Medical History:  Diagnosis Date  . Carpal tunnel syndrome   . 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 Diabetes mellitus   . Fibrocystic breast disease   . Fibromyalgia   . Focal dystonia   . GERD (gastroesophageal reflux disease)   . Hypertension   . Leukopenia   . Mild cognitive impairment   . RBBB   . Vitamin B12 deficiency     Past Surgical History:  Procedure Laterality Date  . APPENDECTOMY    . CARDIAC CATHETERIZATION     normal  . TONSILLECTOMY      Family History  Problem Relation Age of Onset  . CAD Father   . Stroke Other   . Liver cancer Other     Social history:  reports that she has never smoked. She has never used smokeless tobacco. She reports that she does not drink alcohol or use drugs.    Allergies  Allergen Reactions  . Mold Extract [Trichophyton] Anaphylaxis    sneezing  . Other Other  (See Comments) and Anaphylaxis    Headaches, breathing issues NOVACAINE - shock Headaches, breathing issues  . Ace Inhibitors Cough  . Codeine Nausea And Vomiting  . Darvon [Propoxyphene] Nausea And Vomiting and Other (See Comments)  . Novocain [Procaine Hcl] Swelling    BP drop  . Xylocaine Jelly [Lidocaine Hcl]     unknown    Medications:  Prior to Admission medications   Medication Sig Start Date End Date Taking? Authorizing Provider  ACCU-CHEK AVIVA PLUS test strip  06/27/14  Yes Historical Provider, MD  acetaminophen (TYLENOL) 500 MG tablet Take 500 mg by mouth every 6 (six) hours as needed.   Yes Historical Provider, MD  amLODipine (NORVASC) 10 MG tablet Take 5 mg by mouth daily.  06/22/14  Yes Historical Provider, MD  aspirin EC 81 MG tablet Take 81 mg by mouth daily.   Yes Historical Provider, MD  bismuth subsalicylate (PEPTO BISMOL) 262 MG/15ML suspension Take 30 mLs by mouth every 6 (six) hours as needed for indigestion or diarrhea or loose stools.   Yes Historical Provider, MD  calcium-vitamin D (OSCAL WITH D) 500-200 MG-UNIT per tablet Take 1 tablet by mouth 3 (three) times daily.   Yes Historical Provider, MD  Calcium-Vitamin D-Vitamin K (CALCIUM SOFT CHEWS PO) Take by mouth.   Yes Historical Provider, MD  Carbidopa-Levodopa ER (SINEMET CR) 25-100 MG tablet controlled release TK ONE T PO TID  04/09/16  Yes Historical Provider, MD  cetirizine (ZYRTEC) 10 MG tablet Take 5 mg by mouth daily.   Yes Historical Provider, MD  cholecalciferol (VITAMIN D) 1000 UNITS tablet Take 1,000 Units by mouth daily.    Yes Historical Provider, MD  diazepam (VALIUM) 2 MG tablet Take 1 tablet (2 mg total) by mouth 3 (three) times daily. 12/03/15  Yes York Spaniel, MD  furosemide (LASIX) 20 MG tablet TK 1 T PO BID 04/13/16  Yes Historical Provider, MD  glipiZIDE (GLUCOTROL) 5 MG tablet Take 2.5 mg by mouth 2 (two) times daily before a meal.    Yes Historical Provider, MD  KRILL OIL PO Take 1  capsule by mouth daily.   Yes Historical Provider, MD  losartan (COZAAR) 50 MG tablet TK 1 T PO QD 09/28/15  Yes Historical Provider, MD  metFORMIN (GLUCOPHAGE) 500 MG tablet Take 1 tablet (500 mg total) by mouth 2 (two) times daily with a meal. Do not restart Metformin until 7/12 am then continue prior home dosing of 1 tablet in am, 1 tablet at noon and 2 tablets in PM Patient taking differently: Take 500 mg by mouth See admin instructions. Take with each meal ( 3 times daily) and before bed 01/04/12  Yes Quintella Reichert, MD  multivitamin-lutein (OCUVITE-LUTEIN) CAPS Take 1 capsule by mouth daily.   Yes Historical Provider, MD  tiZANidine (ZANAFLEX) 2 MG tablet TAKE 1 TABLET BY MOUTH EVERY MORNING, 1 AT MIDDAY, AND 2 TABLETS IN THE EVENING 11/12/15  Yes York Spaniel, MD  VESICARE 5 MG tablet Take 2.5 mg by mouth at bedtime. 03/17/15  Yes Historical Provider, MD  vitamin B-12 (CYANOCOBALAMIN) 1000 MCG tablet Take 1,000 mcg by mouth daily.   Yes Historical Provider, MD    ROS:  Out of a complete 14 system review of symptoms, the patient complains only of the following symptoms, and all other reviewed systems are negative.  Choking Speech difficulty  Blood pressure (!) 162/80, pulse 96, height 5\' 4"  (1.626 m), weight 160 lb 8 oz (72.8 kg).  Physical Exam  General: The patient is alert and cooperative at the time of the examination.  Skin: No significant peripheral edema is noted.   Neurologic Exam  Mental status: The patient is alert and oriented x 3 at the time of the examination. The patient has apparent normal recent and remote memory, with an apparently normal attention span and concentration ability.   Cranial nerves: Facial symmetry is present. Speech is monosyllabic, nonfluent. Extraocular movements are full. Visual fields are full. Mild masking of the face is seen.  Motor: The patient has good strength in the right extremities. The left upper extremity has increased motor tone, in  flexion. The patient has minimal grip, minimal ability to elevate arm and flex and extend the elbow. The patient has 4 minus/5 strength of the left leg.  Sensory examination: Soft touch sensation is symmetric on the face, arms, and legs.  Coordination: The patient has good finger-nose-finger and heel-to-shin on the right, unable to perform with the left arm, has some difficulty with the left leg.  Gait and station: The patient requires some assistance with standing. Once up, she can walk with minimal assistance, gait is slightly wide-based. Romberg is negative. Tandem gait was not attempted.  Reflexes: Deep tendon reflexes are symmetric.   Assessment/Plan:  1. Corticobasal degeneration  2. Gait disorder  3. Dysphagia  The patient's ability to swallow has worsened over time, she does not wish  to have another modified barium swallow with this time. The patient will continue to get the Botox injections in the left arm, but the effectiveness of this is becoming less pronounced over time. The patient is being safe with walking, ambulating with a walker only when someone is around. She will follow-up in 6 months, sooner if needed.  Marlan Palau. Keith Amaurie Schreckengost MD 04/28/2016 2:51 PM  [facility] [address] [place], [place] [address]  Phone [phone] Fax [phone]

## 2016-05-05 ENCOUNTER — Encounter: Payer: Self-pay | Admitting: Podiatry

## 2016-05-05 ENCOUNTER — Ambulatory Visit (INDEPENDENT_AMBULATORY_CARE_PROVIDER_SITE_OTHER): Payer: Medicare Other | Admitting: Podiatry

## 2016-05-05 VITALS — Ht 64.0 in | Wt 160.0 lb

## 2016-05-05 DIAGNOSIS — B351 Tinea unguium: Secondary | ICD-10-CM | POA: Diagnosis not present

## 2016-05-05 DIAGNOSIS — M79675 Pain in left toe(s): Secondary | ICD-10-CM | POA: Diagnosis not present

## 2016-05-05 DIAGNOSIS — M79674 Pain in right toe(s): Secondary | ICD-10-CM

## 2016-05-05 NOTE — Progress Notes (Signed)
Subjective:     Patient ID: Tracy [name], female   DOB: 1934/03/04, 82 y.o.   MRN: [id number]  HPI this patient returns to the office with chief complaint Of pain big toe, right foot.  She says her nails are painful as she walks and wears her shoes. This patient is a diabetic. She presents the office for preventative foot care services   Review of Systems     Objective:   Physical Exam GENERAL APPEARANCE: Alert, conversant. Appropriately groomed. No acute distress.  VASCULAR: Pedal [facility]  [facility].  Capillary refill time is immediate to all digits,  Normal temperature gradient.  Digital hair growth is present bilateral  NEUROLOGIC: sensation is normal to 5.07 monofilament at 5/5 sites bilateral.  Light touch is intact bilateral, Muscle strength normal.  MUSCULOSKELETAL: acceptable muscle strength, tone and stability bilateral.  Intrinsic muscluature intact bilateral.  Rectus appearance of foot and digits noted bilateral.   DERMATOLOGIC: skin color, texture, and turgor are within normal limits.  No preulcerative lesions or ulcers  are seen, no interdigital maceration noted.  No open lesions present.   No drainage noted.  NAILS  Thick disfigured discolored nails right hallux.  No evidence of infection noted..      Assessment:     Onychomycosis right hallux.     Plan:     Debridement and grinding of long painful nail right hallux.  Continue using Formula 3.  RTC 3 months.   Tracy [name] DPM

## 2016-05-31 ENCOUNTER — Other Ambulatory Visit: Payer: Self-pay | Admitting: Neurology

## 2016-06-03 DIAGNOSIS — G3185 Corticobasal degeneration: Secondary | ICD-10-CM | POA: Diagnosis not present

## 2016-06-03 DIAGNOSIS — G248 Other dystonia: Secondary | ICD-10-CM | POA: Diagnosis not present

## 2016-06-28 ENCOUNTER — Other Ambulatory Visit: Payer: Self-pay | Admitting: Neurology

## 2016-07-28 ENCOUNTER — Ambulatory Visit (INDEPENDENT_AMBULATORY_CARE_PROVIDER_SITE_OTHER): Payer: Medicare Other | Admitting: Podiatry

## 2016-07-28 ENCOUNTER — Encounter: Payer: Self-pay | Admitting: Podiatry

## 2016-07-28 VITALS — Ht 66.0 in | Wt 160.0 lb

## 2016-07-28 DIAGNOSIS — B351 Tinea unguium: Secondary | ICD-10-CM | POA: Diagnosis not present

## 2016-07-28 DIAGNOSIS — M79674 Pain in right toe(s): Secondary | ICD-10-CM | POA: Diagnosis not present

## 2016-07-28 DIAGNOSIS — M79675 Pain in left toe(s): Secondary | ICD-10-CM

## 2016-07-28 NOTE — Progress Notes (Signed)
Subjective:     Patient ID: [name], female   DOB: 01/09/1934, 83 y.o.   MRN: [id number]  HPI this patient returns to the office with chief complaint Of pain big toe, right foot.  She says her nails are painful as she walks and wears her shoes. This patient is a diabetic. She presents the office for preventative foot care services   Review of Systems     Objective:   Physical Exam GENERAL APPEARANCE: Alert, conversant. Appropriately groomed. No acute distress.  VASCULAR: Pedal [facility]able [facility]  [facility] bilateral.  Capillary refill time is immediate to all digits,  Normal temperature gradient.  Digital hair growth is present bilateral  NEUROLOGIC: sensation is normal to 5.07 monofilament at 5/5 sites bilateral.  Light touch is intact bilateral, Muscle strength normal.  MUSCULOSKELETAL: acceptable muscle strength, tone and stability bilateral.  Intrinsic muscluature intact bilateral.  Rectus appearance of foot and digits noted bilateral.   DERMATOLOGIC: skin color, texture, and turgor are within normal limits.  No preulcerative lesions or ulcers  are seen, no interdigital maceration noted.  No open lesions present.   No drainage noted.  NAILS  Thick disfigured discolored nails right hallux.  No evidence of infection noted..      Assessment:     Onychomycosis right hallux.     Plan:     Debridement and grinding of long painful nail right hallux.   RTC 3 months.   Helane GuntherGregory Floyd Lusignan DPM

## 2016-08-01 ENCOUNTER — Other Ambulatory Visit: Payer: Self-pay | Admitting: Neurology

## 2016-08-13 ENCOUNTER — Other Ambulatory Visit: Payer: Self-pay | Admitting: Nurse Practitioner

## 2016-08-13 ENCOUNTER — Ambulatory Visit
Admission: RE | Admit: 2016-08-13 | Discharge: 2016-08-13 | Disposition: A | Discharge status: Home / Self Care | Payer: Medicare Other | Source: Ambulatory Visit | Attending: Nurse Practitioner | Admitting: Nurse Practitioner

## 2016-08-13 DIAGNOSIS — R0781 Pleurodynia: Secondary | ICD-10-CM

## 2016-08-17 ENCOUNTER — Other Ambulatory Visit: Payer: Self-pay | Admitting: Neurology

## 2016-08-17 NOTE — Telephone Encounter (Signed)
Faxed printed/signed rx diazepam to pt pharmacy. Fax: [phone]. Received confirmation.  

## 2016-09-01 ENCOUNTER — Other Ambulatory Visit: Payer: Self-pay | Admitting: Neurology

## 2016-10-20 ENCOUNTER — Ambulatory Visit (INDEPENDENT_AMBULATORY_CARE_PROVIDER_SITE_OTHER): Payer: Medicare Other | Admitting: Podiatry

## 2016-10-20 DIAGNOSIS — L600 Ingrowing nail: Secondary | ICD-10-CM

## 2016-10-20 DIAGNOSIS — B351 Tinea unguium: Secondary | ICD-10-CM | POA: Diagnosis not present

## 2016-10-20 DIAGNOSIS — M79675 Pain in left toe(s): Secondary | ICD-10-CM

## 2016-10-20 DIAGNOSIS — E119 Type 2 diabetes mellitus without complications: Secondary | ICD-10-CM

## 2016-10-20 DIAGNOSIS — M79674 Pain in right toe(s): Secondary | ICD-10-CM

## 2016-10-20 NOTE — Progress Notes (Signed)
Subjective:     Patient ID: Tracy [name], female   DOB: 11-10-1933, 83 y.o.   MRN: [id number]  HPI this patient returns to the office with chief complaint Of pain big toe, right foot.  She says her nails are painful as she walks and wears her shoes. This patient is a diabetic. She presents the office for preventative foot care services   Review of Systems     Objective:   Physical Exam GENERAL APPEARANCE: Alert, conversant. Appropriately groomed. No acute distress.  VASCULAR: Pedal pulses palpable at  [facility] and PT bilateral.  Capillary refill time is immediate to all digits,  Normal temperature gradient.  Digital hair growth is present bilateral  NEUROLOGIC: sensation is normal to 5.07 monofilament at 5/5 sites bilateral.  Light touch is intact bilateral, Muscle strength normal.  MUSCULOSKELETAL: acceptable muscle strength, tone and stability bilateral.  Intrinsic muscluature intact bilateral.  Rectus appearance of foot and digits noted bilateral.   DERMATOLOGIC: skin color, texture, and turgor are within normal limits.  No preulcerative lesions or ulcers  are seen, no interdigital maceration noted.  No open lesions present.   No drainage noted.  NAILS  Thick disfigured discolored nails right hallux.  No evidence of infection noted..      Assessment:     Onychomycosis right hallux.     Plan:     Debridement and grinding of long painful nail right hallux.   RTC 3 months.   Helane Gunther DPM

## 2016-11-03 ENCOUNTER — Ambulatory Visit (INDEPENDENT_AMBULATORY_CARE_PROVIDER_SITE_OTHER): Payer: Medicare Other | Admitting: Neurology

## 2016-11-03 ENCOUNTER — Telehealth: Payer: Self-pay | Admitting: Neurology

## 2016-11-03 ENCOUNTER — Encounter: Payer: Self-pay | Admitting: Neurology

## 2016-11-03 VITALS — BP 93/57 | HR 81 | Ht 66.0 in

## 2016-11-03 DIAGNOSIS — G3185 Corticobasal degeneration: Secondary | ICD-10-CM | POA: Diagnosis not present

## 2016-11-03 DIAGNOSIS — R269 Unspecified abnormalities of gait and mobility: Secondary | ICD-10-CM

## 2016-11-03 NOTE — Progress Notes (Signed)
Reason for visit: Corticobasal degeneration  DREAMER CARILLO is an 83 y.o. female  History of present illness:  Ms. Kendrick is an 83 year old right-handed white female with a history of corticobasal degeneration. The patient has predominantly left-sided symptoms with weakness and spasticity. The patient has a slow monosyllabic speech pattern and movement abnormalities. The patient is on Sinemet in low dose. She continues to have significant gait instability, she has fallen more frequently over the last 4 or 5 months, she fell yesterday prior to this revisit. The patient uses a walker for ambulation, she is now in [facility] care facility. She is getting some therapy, she is exercising 3 times a week. She does report some problems with swallowing, this has not worsened much since last seen. She has very little use of the left hand and arm at this time. One week ago, she noted some slight numbness of the fourth and fifth fingers on the right hand. She denies any neck pain or elbow discomfort. She has no weakness in the right hand.  Past Medical History:  Diagnosis Date  . Carpal tunnel syndrome   . 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 Diabetes mellitus   . Fibrocystic breast disease   . Fibromyalgia   . Focal dystonia   . GERD (gastroesophageal reflux disease)   . Hypertension   . Leukopenia   . Mild cognitive impairment   . RBBB   . Vitamin B12 deficiency     Past Surgical History:  Procedure Laterality Date  . APPENDECTOMY    . CARDIAC CATHETERIZATION     normal  . TONSILLECTOMY      Family History  Problem Relation Age of Onset  . CAD Father   . Stroke Other   . Liver cancer Other     Social history:  reports that she has never smoked. She has never used smokeless tobacco. She reports that she does not drink alcohol or use drugs.    Allergies  Allergen Reactions  . Mold Extract [Trichophyton] Anaphylaxis    sneezing  . Other Other (See Comments) and  Anaphylaxis    Headaches, breathing issues NOVACAINE - shock Headaches, breathing issues  . Ace Inhibitors Cough  . Codeine Nausea And Vomiting  . Darvon [Propoxyphene] Nausea And Vomiting and Other (See Comments)  . Novocain [Procaine Hcl] Swelling    BP drop  . Xylocaine Jelly [Lidocaine Hcl]     unknown    Medications:  Prior to Admission medications   Medication Sig Start Date End Date Taking? Authorizing Provider  ACCU-CHEK AVIVA PLUS test strip  06/27/14  Yes [provider]  acetaminophen (TYLENOL) 500 MG tablet Take 500 mg by mouth every 6 (six) hours as needed.   Yes [provider]  amLODipine (NORVASC) 10 MG tablet Take 10 mg by mouth daily.  06/22/14  Yes [provider]  aspirin EC 81 MG tablet Take 81 mg by mouth daily.   Yes [provider]  Calcium-Vitamin D-Vitamin K (CALCIUM SOFT CHEWS PO) Take by mouth.   Yes [provider]  Carbidopa-Levodopa ER (SINEMET CR) 25-100 MG tablet controlled release TK ONE T PO TID 04/09/16  Yes [provider]  cetirizine (ZYRTEC) 10 MG tablet Take 5 mg by mouth daily.   Yes [provider]  cholecalciferol (VITAMIN D) 1000 UNITS tablet Take 1,000 Units by mouth daily.    Yes [provider]  diazepam (VALIUM) 2 MG tablet TAKE 1 TABLET BY MOUTH THREE TIMES DAILY  08/17/16  Yes York Spaniel, MD  furosemide (LASIX) 20 MG tablet TK 1 T PO BID 04/13/16  Yes [provider]  glipiZIDE (GLUCOTROL) 5 MG tablet Take 2.5 mg by mouth 2 (two) times daily before a meal.    Yes [provider]  Glucosamine-MSM-Hyaluronic Acd (JOINT HEALTH PO) Take 1 tablet by mouth daily.   Yes [provider]  KRILL OIL PO Take 1 capsule by mouth daily.   Yes [provider]  losartan (COZAAR) 50 MG tablet TK 1 T PO QD 09/28/15  Yes [provider]  metFORMIN (GLUCOPHAGE) 500 MG tablet Take 1 tablet (500 mg total) by mouth 2 (two) times daily with a  meal. Do not restart Metformin until 7/12 am then continue prior home dosing of 1 tablet in am, 1 tablet at noon and 2 tablets in PM Patient taking differently: Take 500 mg by mouth See admin instructions. Take with each meal ( 3 times daily) and before bed 01/04/12  Yes Turner, Cornelious Bryant, MD  multivitamin-lutein (OCUVITE-LUTEIN) CAPS Take 1 capsule by mouth daily.   Yes [provider]  ranitidine (ZANTAC) 75 MG tablet Take 75 mg by mouth at bedtime.   Yes [provider]  tiZANidine (ZANAFLEX) 2 MG tablet TAKE 1 TABLET BY MOUTH EVERY MORNING, 1 AT MIDDAY, AND 2 TABLETS IN THE EVENING 09/01/16  Yes York Spaniel, MD  VESICARE 5 MG tablet Take 2.5 mg by mouth at bedtime. 03/17/15  Yes [provider]  vitamin B-12 (CYANOCOBALAMIN) 1000 MCG tablet Take 1,000 mcg by mouth daily.   Yes [provider]    ROS:  Out of a complete 14 system review of symptoms, the patient complains only of the following symptoms, and all other reviewed systems are negative.  Speech difficulty Arm weakness  Blood pressure (!) 93/57, pulse 81, height 5\' 6"  (1.676 m).  Physical Exam  General: The patient is alert and cooperative at the time of the examination.  Skin: No significant peripheral edema is noted.   Neurologic Exam  Mental status: The patient is alert and oriented x 3 at the time of the examination. The patient has apparent normal recent and remote memory, with an apparently normal attention span and concentration ability.   Cranial nerves: Facial symmetry is present. Speech is slow, monosyllabic. Extraocular movements are full, with the patient appears to have difficulty tracking objects and is unable to maintain a fixed gaze, the eyes will return to the neutral position. Visual fields are full. There is significant masking of the face.  Motor: The patient has good strength in all 4 extremities, with exception of the left upper extremity with diffuse 3/5  strength.  Sensory examination: Soft touch sensation is symmetric on the face, arms, and legs.  Coordination: The patient has good finger-nose-finger on the right and heel-to-shin bilaterally. The patient is unable to perform finger-nose-finger with the left arm  Gait and station: The patient can stand with assistance. Once up, she is short shuffling steps, slightly wide-based gait, she can walk with assistance. Romberg is negative. Tandem gait was not attempted.  Reflexes: Deep tendon reflexes are symmetric.   Assessment/Plan:  1. Corticobasal degeneration  The patient remains on Sinemet, is not clear that she is getting benefit from this medication, this may need to be tapered in the future. The patient has had ongoing progression of her ability to ambulate. She has moved to an extended care facility which is reasonable at this time. She  will follow-up in about 6 months, sooner if needed. The patient is complaining of some numbness of the fourth and fifth fingers on the right, this is an early ulnar neuropathy, no weakness is seen at this time. If the symptoms persist or worsen, nerve conduction studies can be done in the future.  Marlan Palau. Keith Imogean Ciampa MD 11/03/2016 2:33 PM  [facility] [address] [place], [place] [address]  Phone [phone] Fax [phone]

## 2016-11-03 NOTE — Telephone Encounter (Signed)
The office note was faxed over today.

## 2016-11-03 NOTE — Telephone Encounter (Signed)
Persina with [facility] called office in reference to patients visit today.  Papers that went back with patient that were filled out at today's visit per Persina can not read it.  Requesting to fax to [phone] Attn: Cordelia PenSherry

## 2017-01-19 ENCOUNTER — Ambulatory Visit: Payer: Medicare Other | Admitting: Podiatry

## 2017-01-26 ENCOUNTER — Ambulatory Visit: Payer: Medicare Other | Admitting: Podiatry

## 2017-02-03 ENCOUNTER — Encounter: Payer: Self-pay | Admitting: Podiatry

## 2017-02-03 ENCOUNTER — Ambulatory Visit (INDEPENDENT_AMBULATORY_CARE_PROVIDER_SITE_OTHER): Payer: Medicare Other | Admitting: Podiatry

## 2017-02-03 DIAGNOSIS — B351 Tinea unguium: Secondary | ICD-10-CM

## 2017-02-03 DIAGNOSIS — E1169 Type 2 diabetes mellitus with other specified complication: Secondary | ICD-10-CM | POA: Diagnosis not present

## 2017-02-03 NOTE — Progress Notes (Signed)
Complaint:  Visit Type: Patient returns to my office for continued preventative foot care services. Complaint: Patient states" my nails have grown long and thick and become painful to walk and wear shoes" Patient has been diagnosed with DM with no foot complications. The patient presents for preventative foot care services. No changes to ROS  Podiatric Exam: Vascular: dorsalis pedis and posterior tibial pulses are palpable bilateral. Capillary return is immediate. Temperature gradient is WNL. Skin turgor WNL  Sensorium: Normal Semmes Weinstein monofilament test. Normal tactile sensation bilaterally. Nail Exam: Pt has thick disfigured discolored nails with subungual debris noted bilateral entire nail hallux  Ulcer Exam: There is no evidence of ulcer or pre-ulcerative changes or infection. Orthopedic Exam: Muscle tone and strength are WNL. No limitations in general ROM. No crepitus or effusions noted. Foot type and digits show no abnormalities. Bony prominences are unremarkable. Skin: No Porokeratosis. No infection or ulcers  Diagnosis:  Onychomycosis, , Pain in right toe, pain in left toes  Treatment & Plan Procedures and Treatment: Consent by patient was obtained for treatment procedures. The patient understood the discussion of treatment and procedures well. All questions were answered thoroughly reviewed. Debridement of mycotic and hypertrophic toenails, 1 through 5 bilateral and clearing of subungual debris. No ulceration, no infection noted.  Return Visit-Office Procedure: Patient instructed to return to the office for a follow up visit 3 months for continued evaluation and treatment.    Lashonta Pilling DPM 

## 2017-03-03 DIAGNOSIS — E569 Vitamin deficiency, unspecified: Secondary | ICD-10-CM | POA: Diagnosis not present

## 2017-03-03 DIAGNOSIS — M545 Low back pain: Secondary | ICD-10-CM | POA: Diagnosis not present

## 2017-03-03 DIAGNOSIS — G20 Parkinson's disease: Secondary | ICD-10-CM | POA: Diagnosis not present

## 2017-03-03 DIAGNOSIS — I10 Essential (primary) hypertension: Secondary | ICD-10-CM | POA: Diagnosis not present

## 2017-03-03 DIAGNOSIS — N3281 Overactive bladder: Secondary | ICD-10-CM | POA: Diagnosis not present

## 2017-03-03 DIAGNOSIS — G3185 Corticobasal degeneration: Secondary | ICD-10-CM | POA: Diagnosis not present

## 2017-03-03 DIAGNOSIS — K219 Gastro-esophageal reflux disease without esophagitis: Secondary | ICD-10-CM | POA: Diagnosis not present

## 2017-03-03 DIAGNOSIS — E119 Type 2 diabetes mellitus without complications: Secondary | ICD-10-CM | POA: Diagnosis not present

## 2017-03-03 DIAGNOSIS — J3089 Other allergic rhinitis: Secondary | ICD-10-CM | POA: Diagnosis not present

## 2017-03-03 DIAGNOSIS — G248 Other dystonia: Secondary | ICD-10-CM | POA: Diagnosis not present

## 2017-05-04 ENCOUNTER — Encounter: Payer: Self-pay | Admitting: Podiatry

## 2017-05-04 ENCOUNTER — Ambulatory Visit (INDEPENDENT_AMBULATORY_CARE_PROVIDER_SITE_OTHER): Payer: Medicare Other | Admitting: Podiatry

## 2017-05-04 DIAGNOSIS — M79674 Pain in right toe(s): Secondary | ICD-10-CM

## 2017-05-04 DIAGNOSIS — B351 Tinea unguium: Secondary | ICD-10-CM | POA: Diagnosis not present

## 2017-05-04 DIAGNOSIS — M79675 Pain in left toe(s): Secondary | ICD-10-CM

## 2017-05-04 DIAGNOSIS — E119 Type 2 diabetes mellitus without complications: Secondary | ICD-10-CM

## 2017-05-04 DIAGNOSIS — E1169 Type 2 diabetes mellitus with other specified complication: Secondary | ICD-10-CM

## 2017-05-04 NOTE — Progress Notes (Signed)
Complaint:  Visit Type: Patient returns to my office for continued preventative foot care services. Complaint: Patient states" my nails have grown long and thick and become painful to walk and wear shoes" Patient has been diagnosed with DM with no foot complications. The patient presents for preventative foot care services. No changes to ROS  Podiatric Exam: Vascular: dorsalis pedis and posterior tibial pulses are palpable bilateral. Capillary return is immediate. Temperature gradient is WNL. Skin turgor WNL  Sensorium: Normal Semmes Weinstein monofilament test. Normal tactile sensation bilaterally. Nail Exam: Pt has thick disfigured discolored nails with subungual debris noted bilateral entire nail hallux  Ulcer Exam: There is no evidence of ulcer or pre-ulcerative changes or infection. Orthopedic Exam: Muscle tone and strength are WNL. No limitations in general ROM. No crepitus or effusions noted. Foot type and digits show no abnormalities. Bony prominences are unremarkable. Skin: No Porokeratosis. No infection or ulcers  Diagnosis:  Onychomycosis, , Pain in right toe, pain in left toes  Treatment & Plan Procedures and Treatment: Consent by patient was obtained for treatment procedures. The patient understood the discussion of treatment and procedures well. All questions were answered thoroughly reviewed. Debridement of mycotic and hypertrophic toenails, 1 through 5 bilateral and clearing of subungual debris. No ulceration, no infection noted.  Return Visit-Office Procedure: Patient instructed to return to the office for a follow up visit 3 months for continued evaluation and treatment.    Helane GuntherGregory Sharisse Rantz DPM

## 2017-05-23 ENCOUNTER — Ambulatory Visit (INDEPENDENT_AMBULATORY_CARE_PROVIDER_SITE_OTHER): Payer: Medicare Other | Admitting: Neurology

## 2017-05-23 ENCOUNTER — Encounter: Payer: Self-pay | Admitting: Neurology

## 2017-05-23 VITALS — BP 100/58 | HR 71

## 2017-05-23 DIAGNOSIS — R269 Unspecified abnormalities of gait and mobility: Secondary | ICD-10-CM | POA: Diagnosis not present

## 2017-05-23 DIAGNOSIS — G3185 Corticobasal degeneration: Secondary | ICD-10-CM

## 2017-05-23 NOTE — Progress Notes (Signed)
Reason for visit: Corticobasil degeneration  Tracy [name] is an 83 y.o. female  History of present illness:  Tracy [name] is an 83 year old right-handed white female with a history of corticobasal degeneration.  The patient has had progressive weakness on the left side, she is almost plegic on the left side at this point.  She has good strength on the right side of the body, she has developed dysarthria and difficulty with swallowing.  The patient is followed through Dr. Dorina HoyerSidiqui, she gets Botox injections on a regular basis.  The patient has not had any falls, if she is walking at this point she uses a walker and she has a PT belt with someone holding onto her.  The patient sleeps only about 4 or 5 hours at night, she may nap during the day.  She denies any pain.  She continues to have difficulty with swallowing and occasional choking.  It is quite a bit of an effort to talk, speech is dysarthric.  She returns for an evaluation.  She remains on tizanidine and diazepam.  Past Medical History:  Diagnosis Date  . Carpal tunnel syndrome   . 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 Diabetes mellitus   . Fibrocystic breast disease   . Fibromyalgia   . Focal dystonia   . GERD (gastroesophageal reflux disease)   . Hypertension   . Leukopenia   . Mild cognitive impairment   . RBBB   . Vitamin B12 deficiency     Past Surgical History:  Procedure Laterality Date  . APPENDECTOMY    . CARDIAC CATHETERIZATION     normal  . TONSILLECTOMY      Family History  Problem Relation Age of Onset  . CAD Father   . Stroke Other   . Liver cancer Other     Social history:  reports that  has never smoked. she has never used smokeless tobacco. She reports that she does not drink alcohol or use drugs.    Allergies  Allergen Reactions  . Mold Extract [Trichophyton] Anaphylaxis    sneezing  . Other Other (See Comments) and Anaphylaxis    Headaches, breathing issues NOVACAINE - shock Headaches,  breathing issues  . Ace Inhibitors Cough  . Codeine Nausea And Vomiting  . Darvon [Propoxyphene] Nausea And Vomiting and Other (See Comments)  . Novocain [Procaine Hcl] Swelling    BP drop  . Xylocaine Jelly [Lidocaine Hcl]     unknown    Medications:  Prior to Admission medications   Medication Sig Start Date End Date Taking? Authorizing Provider  ACCU-CHEK AVIVA PLUS test strip 1 each as needed.  06/27/14  Yes [provider]  acetaminophen (TYLENOL) 500 MG tablet Take 500 mg by mouth every 6 (six) hours as needed.   Yes [provider]  amLODipine (NORVASC) 5 MG tablet Take 5 mg by mouth daily.   Yes [provider]  aspirin EC 81 MG tablet Take 81 mg by mouth daily.   Yes [provider]  Calcium-Vitamin D-Vitamin K (VIACTIV) 500-500-40 MG-UNT-MCG CHEW Chew 1 tablet by mouth 3 (three) times daily.   Yes [provider]  Carbidopa-Levodopa ER (SINEMET CR) 25-100 MG tablet controlled release TK ONE T PO TID 04/09/16  Yes [provider]  cetirizine (ZYRTEC) 10 MG tablet Take 5 mg by mouth daily.   Yes [provider]  cholecalciferol (VITAMIN D) 1000 UNITS tablet Take 1,000 Units by mouth daily.    Yes [provider]  diazepam (VALIUM) 2 MG tablet TAKE 1 TABLET BY MOUTH THREE TIMES DAILY Patient taking differently: TAKE 1/2 TABLET BY MOUTH  DAILY 08/17/16  Yes York Spaniel, MD  Glucosamine-MSM-Hyaluronic Acd (JOINT HEALTH PO) Take 1 tablet by mouth daily.   Yes [provider]  losartan (COZAAR) 50 MG tablet TK 1 T PO QD 09/28/15  Yes [provider]  MEGARED OMEGA-3 KRILL OIL PO Take 1 capsule by mouth daily.   Yes [provider]  metFORMIN (GLUCOPHAGE) 500 MG tablet Take 1 tablet (500 mg total) by mouth 2 (two) times daily with a meal. Do not restart Metformin until 7/12 am then continue prior home dosing of 1 tablet in am, 1 tablet at noon and 2 tablets in PM Patient taking  differently: Take 500 mg by mouth See admin instructions. Take with each meal ( 3 times daily) and before bed 01/04/12  Yes Turner, Cornelious Bryant, MD  multivitamin-lutein (OCUVITE-LUTEIN) CAPS Take 1 capsule by mouth daily.   Yes [provider]  omeprazole (PRILOSEC) 10 MG capsule Take 10 mg by mouth daily.   Yes [provider]  tiZANidine (ZANAFLEX) 2 MG tablet TAKE 1 TABLET BY MOUTH EVERY MORNING, 1 AT MIDDAY, AND 2 TABLETS IN THE EVENING Patient taking differently: TAKE 1 TABLET BY MOUTH two times daily 09/01/16  Yes York Spaniel, MD  tiZANidine (ZANAFLEX) 4 MG tablet Take 4 mg by mouth at bedtime.   Yes [provider]  VESICARE 5 MG tablet Take 2.5 mg by mouth at bedtime. 03/17/15  Yes [provider]  vitamin B-12 (CYANOCOBALAMIN) 1000 MCG tablet Take 1,000 mcg by mouth daily.   Yes [provider]    ROS:  Out of a complete 14 system review of symptoms, the patient complains only of the following symptoms, and all other reviewed systems are negative.  Difficulty swallowing Eye itching, light sensitivity Walking difficulty, neck stiffness Speech difficulty  Blood pressure (!) 100/58, pulse 71, SpO2 96 %.  Physical Exam  General: The patient is alert and cooperative at the time of the examination.  Skin: No significant peripheral edema is noted.   Neurologic Exam  Mental status: The patient is alert and oriented x 3 at the time of the examination. The patient has apparent normal recent and remote memory, with an apparently normal attention span and concentration ability.   Cranial nerves: Facial symmetry is present. Speech is dysarthric, not a phasic. Extraocular movements are notable for some difficulty with tracking objects horizontally, the patient has significantly impaired vertical eye movements. Visual fields are full.  Motor: The patient has good strength in the right upper and right lower extremities.  The patient is almost  plegic with the left upper extremity, she has ability to extend the left leg below the knee slightly, otherwise no voluntary movement is seen.  Sensory examination: Soft touch sensation is symmetric on the face, arms, and legs.  Coordination: The patient has good finger-nose-finger and heel-to-shin on the right, unable to perform on the left.  Gait and station: The patient is wheelchair-bound, she was not ambulated.  Reflexes: Deep tendon reflexes are symmetric.   Assessment/Plan:  1. Corticobasal degeneration  2.  Left hemiparesis   3.  Gait disorder  4.  Dysphagia  The patient will remain on diazepam and tizanidine.  She will follow-up in 6 months or so.  At some point, the right side of the body may be involved.  She will continue the Botox injections through  Dr. Dorina HoyerSidiqui.  Marlan Palau. Keith [name] MD 05/23/2017 2:43 PM  [facility] [address] [place], [place] [address]  Phone [phone] Fax [phone]

## 2017-06-27 DIAGNOSIS — L304 Erythema intertrigo: Secondary | ICD-10-CM | POA: Diagnosis not present

## 2017-06-27 DIAGNOSIS — N393 Stress incontinence (female) (male): Secondary | ICD-10-CM | POA: Diagnosis not present

## 2017-06-27 DIAGNOSIS — M62838 Other muscle spasm: Secondary | ICD-10-CM | POA: Diagnosis not present

## 2017-06-27 DIAGNOSIS — G3185 Corticobasal degeneration: Secondary | ICD-10-CM | POA: Diagnosis not present

## 2017-06-27 DIAGNOSIS — K219 Gastro-esophageal reflux disease without esophagitis: Secondary | ICD-10-CM | POA: Diagnosis not present

## 2017-06-27 DIAGNOSIS — R4702 Dysphasia: Secondary | ICD-10-CM | POA: Diagnosis not present

## 2017-06-27 DIAGNOSIS — G4700 Insomnia, unspecified: Secondary | ICD-10-CM | POA: Diagnosis not present

## 2017-06-27 DIAGNOSIS — I10 Essential (primary) hypertension: Secondary | ICD-10-CM | POA: Diagnosis not present

## 2017-06-27 DIAGNOSIS — K5900 Constipation, unspecified: Secondary | ICD-10-CM | POA: Diagnosis not present

## 2017-06-27 DIAGNOSIS — H04123 Dry eye syndrome of bilateral lacrimal glands: Secondary | ICD-10-CM | POA: Diagnosis not present

## 2017-06-27 DIAGNOSIS — E119 Type 2 diabetes mellitus without complications: Secondary | ICD-10-CM | POA: Diagnosis not present

## 2017-07-05 DIAGNOSIS — M6281 Muscle weakness (generalized): Secondary | ICD-10-CM | POA: Diagnosis not present

## 2017-07-05 DIAGNOSIS — I10 Essential (primary) hypertension: Secondary | ICD-10-CM | POA: Diagnosis not present

## 2017-07-05 DIAGNOSIS — E119 Type 2 diabetes mellitus without complications: Secondary | ICD-10-CM | POA: Diagnosis not present

## 2017-07-05 DIAGNOSIS — Z7984 Long term (current) use of oral hypoglycemic drugs: Secondary | ICD-10-CM | POA: Diagnosis not present

## 2017-07-05 DIAGNOSIS — R414 Neurologic neglect syndrome: Secondary | ICD-10-CM | POA: Diagnosis not present

## 2017-08-10 ENCOUNTER — Ambulatory Visit: Payer: Medicare Other | Admitting: Podiatry

## 2017-09-01 ENCOUNTER — Other Ambulatory Visit: Payer: Self-pay | Admitting: Internal Medicine

## 2017-09-01 ENCOUNTER — Ambulatory Visit (HOSPITAL_COMMUNITY)
Admission: RE | Admit: 2017-09-01 | Discharge: 2017-09-01 | Disposition: A | Discharge status: Home / Self Care | Payer: Medicare Other | Source: Ambulatory Visit | Attending: Internal Medicine | Admitting: Internal Medicine

## 2017-09-01 DIAGNOSIS — W19XXXA Unspecified fall, initial encounter: Secondary | ICD-10-CM

## 2017-09-01 DIAGNOSIS — S32019A Unspecified fracture of first lumbar vertebra, initial encounter for closed fracture: Secondary | ICD-10-CM | POA: Insufficient documentation

## 2017-09-01 DIAGNOSIS — T1490XA Injury, unspecified, initial encounter: Secondary | ICD-10-CM | POA: Insufficient documentation

## 2017-11-28 ENCOUNTER — Ambulatory Visit: Payer: Medicare Other | Admitting: Neurology

## 2018-03-09 ENCOUNTER — Telehealth: Payer: Self-pay | Admitting: Neurology

## 2018-03-09 NOTE — Telephone Encounter (Signed)
Verlon [email or web address] is asking for a call back at [phone] to discuss Botox for pt

## 2018-03-10 NOTE — Telephone Encounter (Signed)
I called and [facility] and she took a message for La MinitaLeslie to call me back.

## 2018-03-21 NOTE — Telephone Encounter (Signed)
Patients care taker called and stated that the patient is receiving Botox injections at another office but would like to start getting them in our office, could you advise?

## 2018-03-21 NOTE — Telephone Encounter (Signed)
I talk with the family, the patient is getting Botox injections on the left arm through Dr. Westley HummerSiddiqi, the patient wishes to transfer this procedure to this office.  I will contact Dr. Terrace ArabiaYan to see if she would be willing to do this.

## 2018-03-21 NOTE — Telephone Encounter (Signed)
Dr. Anne HahnWillis- please advise. You last saw her 05/23/17. No pending appt. She is currently getting botox inj. Via Dr. Dorina HoyerSidiqui.

## 2018-03-22 ENCOUNTER — Encounter: Payer: Self-pay | Admitting: *Deleted

## 2018-03-22 DIAGNOSIS — G8114 Spastic hemiplegia affecting left nondominant side: Secondary | ICD-10-CM | POA: Insufficient documentation

## 2018-03-22 NOTE — Telephone Encounter (Signed)
Gave completed/signed xeomin packet to [employer] to process for pt. Will speak with Michelle,RN to see where we can schedule pt to come in for injections.

## 2018-03-22 NOTE — Telephone Encounter (Addendum)
I reviewed that this record, last injection with Dr. Westley Hummer was on June 02, 2017,  TYPE OF BOTULINUM TOXIN:Botox DOSE USED: 300 UNITS as follows DISCARDED: 0   After obtaining informed consent, using aseptic technique, and under EMG guidance due to the complex layering of the musculature, Botulinum Toxin injections were given in the following areas;   LEFT ARM  FDS: 40 active emg FDP 30 units active emg Biceps: 50+50units active emg  Brachialis: 50 units  Pronater teres: 30 units new active emg Brachioradialis: 50 units (new) active emg   Most recent visit with Dr. Anne Hahn in Nov 2018: " Motor: The patient has good strength in the right upper and right lower extremities.  The patient is almost plegic with the left upper extremity, she has ability to extend the left leg below the knee slightly, otherwise no voluntary movement is seen."  I put preauthorization for xeomin 600 units, will see patient and do injection at the first visit, use xeomin 300 units at first injection

## 2018-03-22 NOTE — Telephone Encounter (Addendum)
Called, LVM for Curtis-son (on DPR) to call and schedule xeomin injection with Dr. Terrace Arabia.  Can offer 04/12/18 at 1:00pm with DR. YAN if he calls back to schedule. They can work her in then.

## 2018-03-27 NOTE — Telephone Encounter (Signed)
Tracy [name] FYI Called and spoke with son, [name]. He asked that I call [facility] at [phone] to schedule appt. I called and scheduled appt for 04/12/18 at 1pm, check in 1230pm with Dr. Terrace Arabia. Nothing further needed.

## 2018-03-27 NOTE — Telephone Encounter (Signed)
Noted, thank you

## 2018-04-06 ENCOUNTER — Telehealth: Payer: Self-pay | Admitting: Neurology

## 2018-04-06 NOTE — Telephone Encounter (Signed)
I called [employer] and spoke with Loraine Leriche, who stated that the patient is active and NPR is required. [id number]. DW

## 2018-04-12 ENCOUNTER — Ambulatory Visit (INDEPENDENT_AMBULATORY_CARE_PROVIDER_SITE_OTHER): Payer: Medicare Other | Admitting: Neurology

## 2018-04-12 ENCOUNTER — Ambulatory Visit: Payer: Self-pay | Admitting: Neurology

## 2018-04-12 ENCOUNTER — Encounter: Payer: Self-pay | Admitting: Neurology

## 2018-04-12 VITALS — BP 134/72 | HR 81

## 2018-04-12 DIAGNOSIS — G3185 Corticobasal degeneration: Secondary | ICD-10-CM

## 2018-04-12 DIAGNOSIS — G8114 Spastic hemiplegia affecting left nondominant side: Secondary | ICD-10-CM

## 2018-04-12 NOTE — Progress Notes (Signed)
PATIENT: CODY OLIGER DOB: 1933/09/16  Chief Complaint  Patient presents with  . Left Spastic Hemiplegia    First injection here.  Last Botox injection at [facility] on 12/23/17.  Xeomin 100 units x 3 vials.  Office supply.     HISTORICAL  SAHIRA CATALDI is 84 years old right-handed female accompanied by her group home manager, seen in request by Dr. Anne Hahn for evaluation of botulism toxin injection for spastic left upper extremity.  I have reviewed and summarized the referring note from the referring physician.  She had a past medical history of hypertension, diabetes, was diagnosed with cortical basal ganglion degeneration since 2015, presented with difficulty moving her left hand fingers while playing piano, symptoms progressively getting worse over the past few years, no antigravity movement of left upper extremity now, spastic left shoulder, elbow pain, tendency to have finger flexion, she can take a few steps with assistance and walker.   She also develope speech difficulty, denies swallowing difficulty, denies significant memory loss.  She spent most of the day sitting in her special chair, working on her iPad, send the email, and playing games,  She began to receive EMG guided botulism toxin injection since January 2015, initially by Dr. Hosie Poisson, later transferred her care to [facility] Dr. Westley Hummer, last injection was on December 23, 2017, she received 300 units to her left upper extremity  She is transferring her injection for convenient reasons, she now complains of pain of left shoulder, elbows passive movement, no longer have significant left finger contraction.   REVIEW OF SYSTEMS: Full 14 system review of systems performed and notable only for as above All other review of systems were negative.  ALLERGIES: Allergies  Allergen Reactions  . Mold Extract [Trichophyton] Anaphylaxis    sneezing  . Other Other (See Comments) and Anaphylaxis    Headaches, breathing issues NOVACAINE -  shock Headaches, breathing issues  . Ace Inhibitors Cough  . Codeine Nausea And Vomiting  . Darvon [Propoxyphene] Nausea And Vomiting and Other (See Comments)  . Novocain [Procaine Hcl] Swelling    BP drop  . Xylocaine Jelly [Lidocaine Hcl]     unknown    HOME MEDICATIONS: Current Outpatient Medications  Medication Sig Dispense Refill  . ACCU-CHEK AVIVA PLUS test strip 1 each as needed.   3  . acetaminophen (TYLENOL) 500 MG tablet Take 500 mg by mouth every 6 (six) hours as needed.    Marland Kitchen amLODipine (NORVASC) 5 MG tablet Take 5 mg by mouth daily.    Marland Kitchen aspirin EC 81 MG tablet Take 81 mg by mouth daily.    . calcium carbonate (TUMS - DOSED IN MG ELEMENTAL CALCIUM) 500 MG chewable tablet Chew 1 tablet by mouth as needed for indigestion or heartburn.    . Carbidopa-Levodopa ER (SINEMET CR) 25-100 MG tablet controlled release TK ONE T PO TID  8  . cetirizine (ZYRTEC) 10 MG tablet Take 5 mg by mouth daily.    . diazepam (VALIUM) 2 MG tablet TAKE 1 TABLET BY MOUTH THREE TIMES DAILY (Patient taking differently: TAKE 1/2 TABLET BY MOUTH  DAILY) 90 tablet 5  . Glucosamine-MSM-Hyaluronic Acd (JOINT HEALTH PO) Take 1 tablet by mouth daily.    . Hypromellose (ARTIFICIAL TEARS OP) Apply 1 drop to eye 4 (four) times daily as needed.    . IncobotulinumtoxinA (XEOMIN IM) Inject into the muscle every 3 (three) months.    . loperamide (ANTI-DIARRHEAL) 2 MG tablet Take 2 mg by mouth daily  as needed for diarrhea or loose stools.    Marland Kitchen losartan (COZAAR) 50 MG tablet TK 1 T PO QD  3  . Melatonin 10 MG CAPS Take 10 mg by mouth at bedtime.    . metFORMIN (GLUCOPHAGE) 500 MG tablet Take 1 tablet (500 mg total) by mouth 2 (two) times daily with a meal. Do not restart Metformin until 7/12 am then continue prior home dosing of 1 tablet in am, 1 tablet at noon and 2 tablets in PM (Patient taking differently: Take 500 mg by mouth See admin instructions. Take with each meal ( 3 times daily) and before bed)    .  multivitamin-lutein (OCUVITE-LUTEIN) CAPS Take 1 capsule by mouth daily.    Marland Kitchen nystatin cream (MYCOSTATIN) Apply 1 application topically as needed for dry skin.    Marland Kitchen omeprazole (PRILOSEC) 10 MG capsule Take 10 mg by mouth daily.    Marland Kitchen tiZANidine (ZANAFLEX) 2 MG tablet TAKE 1 TABLET BY MOUTH EVERY MORNING, 1 AT MIDDAY, AND 2 TABLETS IN THE EVENING (Patient taking differently: TAKE 1 TABLET BY MOUTH two times daily) 120 tablet 0  . tiZANidine (ZANAFLEX) 4 MG tablet Take 4 mg by mouth at bedtime.    . VESICARE 5 MG tablet Take 2.5 mg by mouth at bedtime.  2  . zinc oxide 20 % ointment Apply 1 application topically as needed for irritation.     No current facility-administered medications for this visit.     PAST MEDICAL HISTORY: Past Medical History:  Diagnosis Date  . Carpal tunnel syndrome   . 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 Diabetes mellitus   . Fibrocystic breast disease   . Fibromyalgia   . Focal dystonia   . GERD (gastroesophageal reflux disease)   . Hypertension   . Leukopenia   . Mild cognitive impairment   . RBBB   . Vitamin B12 deficiency     PAST SURGICAL HISTORY: Past Surgical History:  Procedure Laterality Date  . APPENDECTOMY    . CARDIAC CATHETERIZATION     normal  . TONSILLECTOMY      FAMILY HISTORY: Family History  Problem Relation Age of Onset  . CAD Father   . Stroke Other   . Liver cancer Other     SOCIAL HISTORY: Social History   Socioeconomic History  . Marital status: Married    Spouse name: Not on file  . Number of children: 2  . Years of education: masters  . Highest education level: Not on file  Occupational History  . Occupation: retired  Engineer, production  . Financial resource strain: Not on file  . Food insecurity:    Worry: Not on file    Inability: Not on file  . Transportation needs:    Medical: Not on file    Non-medical: Not on file  Tobacco Use  . Smoking status: Never Smoker  . Smokeless tobacco: Never Used    Substance and Sexual Activity  . Alcohol use: No  . Drug use: No  . Sexual activity: Not on file  Lifestyle  . Physical activity:    Days per week: Not on file    Minutes per session: Not on file  . Stress: Not on file  Relationships  . Social connections:    Talks on phone: Not on file    Gets together: Not on file    Attends religious service: Not on file    Active member of club or organization: Not on file  Attends meetings of clubs or organizations: Not on file    Relationship status: Not on file  . Intimate partner violence:    Fear of current or ex partner: Not on file    Emotionally abused: Not on file    Physically abused: Not on file    Forced sexual activity: Not on file  Other Topics Concern  . Not on file  Social History Narrative   Patient lives at [facility]   Address: [address] [place], [place], [place] [address]   Phone: [phone]   Patient is widowed, has 2 children   Patient is right handed   Education level is Master's degree   Caffeine consumption is 2 cups daily     PHYSICAL EXAM   Vitals:   04/12/18 1239  BP: 134/72  Pulse: 81    Not recorded      There is no height or weight on file to calculate BMI.  PHYSICAL EXAMNIATION:  Gen: NAD, conversant, well nourised, obese, well groomed                     Cardiovascular: Regular rate rhythm, no peripheral edema, warm, nontender. Eyes: Conjunctivae clear without exudates or hemorrhage Neck: Supple, no carotid bruits. Pulmonary: Clear to auscultation bilaterally   NEUROLOGICAL EXAM:  MENTAL STATUS: Speech:    Mild slurred speech, following commands, Cognition:     Orientation to time, place and person     Normal recent and remote memory     Normal Attention span and concentration     Normal Language, naming, repeating,spontaneous speech     Fund of knowledge   CRANIAL NERVES: CN II: Visual fields are full to confrontation.  Pupils are round equal and briskly  reactive to light. CN III, IV, VI: Limited upward vertical movement,. CN V: Facial sensation is intact to pinprick in all 3 divisions bilaterally. Corneal responses are intact.  CN VII: Face is symmetric with normal eye closure and smile. CN VIII: Hearing is normal to rubbing fingers CN IX, X: Palate elevates symmetrically. Phonation is normal. CN XI: Head turning and shoulder shrug are intact CN XII: Tongue is midline with normal movements and no atrophy.  MOTOR: She has significant rigidity of left upper extremity, no antigravity movement, complains of left shoulder elbow pain with passive movement, has finger flexion at metaphalangeal joints, no significant distal finger flexion weakness, hypertrophy and palpable contraction of left pectoralis major, latissimus dorsi, tenderness with deep palpitation, tends to hold left arm elbow extension, complains of left elbow pain with passive movement.  Antigravity movement of left lower extremity proximal muscles, difficulty with his left ankle dorsiflexion  REFLEXES: Reflexes are hypoactive and symmetric   SENSORY: Intact to light touch, pinprick  COORDINATION: Normal fine finger movements right side  GAIT/STANCE: Deferred DIAGNOSTIC DATA (LABS, IMAGING, TESTING) - I reviewed patient records, labs, notes, testing and imaging myself where available.   ASSESSMENT AND PLAN  RAINEY RODGER is a 84 y.o. female    Electrical stimulation guided xeomin injection for spastic left upper extremity  Uses 300 units of xeomin today  Left triceps 50 units Left pectoralis major 100 units Left latissimus dorsi 50 units Left pronator teres 50 units Left flexor digitorum profundus 25 units Left flexor digitorum superficialis 25 units   Levert Feinstein, M.D. Ph.D.  [facility] [address] [place], [place] [address] Ph: [phone] Fax: [phone]  CC: Referring Provider

## 2018-04-12 NOTE — Progress Notes (Signed)
**  Xeomin 100 units x 3 vials, [employer]DC 1610-9604-54, Lot [id number], Exp 06/2020, office supply.//[id number]*8

## 2018-04-14 DIAGNOSIS — G8114 Spastic hemiplegia affecting left nondominant side: Secondary | ICD-10-CM | POA: Diagnosis not present

## 2018-04-14 MED ORDER — INCOBOTULINUMTOXINA 100 UNITS IM SOLR
300.0000 [IU] | INTRAMUSCULAR | Status: DC
  Administered 2018-04-14: 300 [IU] via INTRAMUSCULAR

## 2018-04-21 ENCOUNTER — Ambulatory Visit: Payer: Medicare Other | Admitting: Neurology

## 2018-04-28 ENCOUNTER — Ambulatory Visit: Payer: Medicare Other | Admitting: Neurology

## 2018-06-25 ENCOUNTER — Emergency Department (HOSPITAL_COMMUNITY): Payer: Medicare Other

## 2018-06-25 ENCOUNTER — Observation Stay (HOSPITAL_COMMUNITY): Payer: Medicare Other

## 2018-06-25 ENCOUNTER — Encounter (HOSPITAL_COMMUNITY): Payer: Self-pay | Admitting: Family Medicine

## 2018-06-25 ENCOUNTER — Observation Stay (HOSPITAL_COMMUNITY)
Admission: EM | Admit: 2018-06-25 | Discharge: 2018-06-29 | Disposition: A | Discharge status: Home / Self Care | Payer: Medicare Other | Attending: Family Medicine | Admitting: Family Medicine

## 2018-06-25 DIAGNOSIS — R112 Nausea with vomiting, unspecified: Secondary | ICD-10-CM | POA: Diagnosis not present

## 2018-06-25 DIAGNOSIS — R29818 Other symptoms and signs involving the nervous system: Principal | ICD-10-CM

## 2018-06-25 DIAGNOSIS — Z7984 Long term (current) use of oral hypoglycemic drugs: Secondary | ICD-10-CM | POA: Diagnosis not present

## 2018-06-25 DIAGNOSIS — Z79899 Other long term (current) drug therapy: Secondary | ICD-10-CM | POA: Diagnosis not present

## 2018-06-25 DIAGNOSIS — R42 Dizziness and giddiness: Secondary | ICD-10-CM | POA: Diagnosis not present

## 2018-06-25 DIAGNOSIS — I10 Essential (primary) hypertension: Secondary | ICD-10-CM | POA: Diagnosis not present

## 2018-06-25 DIAGNOSIS — G459 Transient cerebral ischemic attack, unspecified: Secondary | ICD-10-CM

## 2018-06-25 DIAGNOSIS — G3185 Corticobasal degeneration: Secondary | ICD-10-CM

## 2018-06-25 DIAGNOSIS — E119 Type 2 diabetes mellitus without complications: Secondary | ICD-10-CM | POA: Diagnosis not present

## 2018-06-25 DIAGNOSIS — Z7982 Long term (current) use of aspirin: Secondary | ICD-10-CM | POA: Insufficient documentation

## 2018-06-25 DIAGNOSIS — R4182 Altered mental status, unspecified: Secondary | ICD-10-CM | POA: Diagnosis present

## 2018-06-25 DIAGNOSIS — J9601 Acute respiratory failure with hypoxia: Secondary | ICD-10-CM

## 2018-06-25 LAB — COMPREHENSIVE METABOLIC PANEL
ALT: 10 U/L (ref 0–44)
AST: 20 U/L (ref 15–41)
Albumin: 3.8 g/dL (ref 3.5–5.0)
Alkaline Phosphatase: 37 U/L — ABNORMAL LOW (ref 38–126)
Anion gap: 11 (ref 5–15)
BUN: 16 mg/dL (ref 8–23)
CO2: 25 mmol/L (ref 22–32)
Calcium: 6.7 mg/dL — ABNORMAL LOW (ref 8.9–10.3)
Chloride: 103 mmol/L (ref 98–111)
Creatinine, Ser: 0.76 mg/dL (ref 0.44–1.00)
GFR calc Af Amer: >60 mL/min (ref >60)
GFR calc non Af Amer: >60 mL/min (ref >60)
Glucose, Bld: 176 mg/dL — ABNORMAL HIGH (ref 70–99)
Potassium: 3.4 mmol/L — ABNORMAL LOW (ref 3.5–5.1)
Sodium: 139 mmol/L (ref 135–145)
Total Bilirubin: 0.5 mg/dL (ref 0.3–1.2)
Total Protein: 6.8 g/dL (ref 6.5–8.1)

## 2018-06-25 LAB — CBC WITH DIFFERENTIAL/PLATELET
Abs Immature Granulocytes: 0.07 10*3/uL (ref 0.00–0.07)
Basophils Absolute: 0.1 10*3/uL (ref 0.0–0.1)
Basophils Relative: 0 %
Eosinophils Absolute: 0.1 10*3/uL (ref 0.0–0.5)
Eosinophils Relative: 1 %
HCT: 36.4 % (ref 36.0–46.0)
Hemoglobin: 12 g/dL (ref 12.0–15.0)
Immature Granulocytes: 1 %
Lymphocytes Relative: 17 %
Lymphs Abs: 2.3 10*3/uL (ref 0.7–4.0)
MCH: 30.5 pg (ref 26.0–34.0)
MCHC: 33 g/dL (ref 30.0–36.0)
MCV: 92.6 fL (ref 80.0–100.0)
MONO ABS: 0.7 10*3/uL (ref 0.1–1.0)
Monocytes Relative: 5 %
NEUTROS ABS: 10 10*3/uL — AB (ref 1.7–7.7)
Neutrophils Relative %: 76 %
Platelets: 485 10*3/uL — ABNORMAL HIGH (ref 150–400)
RBC: 3.93 MIL/uL (ref 3.87–5.11)
RDW: 12.1 % (ref 11.5–15.5)
WBC: 13.2 10*3/uL — ABNORMAL HIGH (ref 4.0–10.5)
nRBC: 0 % (ref 0.0–0.2)

## 2018-06-25 LAB — TROPONIN I: Troponin I: <0.03 ng/mL (ref <0.03)

## 2018-06-25 LAB — APTT: aPTT: 26 seconds (ref 24–36)

## 2018-06-25 LAB — I-STAT TROPONIN, ED: Troponin i, poc: 0.01 ng/mL (ref 0.00–0.08)

## 2018-06-25 LAB — ETHANOL: Alcohol, Ethyl (B): <10 mg/dL (ref <10)

## 2018-06-25 LAB — PROTIME-INR
INR: 0.92
Prothrombin Time: 12.3 seconds (ref 11.4–15.2)

## 2018-06-25 LAB — LIPASE, BLOOD: Lipase: 36 U/L (ref 11–51)

## 2018-06-25 MED ORDER — SENNOSIDES-DOCUSATE SODIUM 8.6-50 MG PO TABS: 1.0000 | ORAL_TABLET | Freq: Every evening | ORAL | Status: DC | PRN

## 2018-06-25 MED ORDER — DARIFENACIN HYDROBROMIDE ER 7.5 MG PO TB24
7.5000 mg | ORAL_TABLET | Freq: Every day | ORAL | Status: DC
  Administered 2018-06-26 – 2018-06-28 (×3): 7.5 mg via ORAL
  Filled 2018-06-25 (×3): qty 1

## 2018-06-25 MED ORDER — ONDANSETRON HCL 4 MG/2ML IJ SOLN: 4.0000 mg | Freq: Four times a day (QID) | INTRAMUSCULAR | Status: DC | PRN

## 2018-06-25 MED ORDER — STROKE: EARLY STAGES OF RECOVERY BOOK
Freq: Once | Status: AC
  Administered 2018-06-26: 1
  Filled 2018-06-25 (×2): qty 1

## 2018-06-25 MED ORDER — INSULIN ASPART 100 UNIT/ML ~~LOC~~ SOLN
0.0000 [IU] | SUBCUTANEOUS | Status: DC
  Administered 2018-06-26 (×2): 2 [IU] via SUBCUTANEOUS
  Administered 2018-06-27: 1 [IU] via SUBCUTANEOUS
  Administered 2018-06-27: 2 [IU] via SUBCUTANEOUS
  Administered 2018-06-27 – 2018-06-28 (×2): 3 [IU] via SUBCUTANEOUS
  Administered 2018-06-28 (×2): 1 [IU] via SUBCUTANEOUS
  Administered 2018-06-28 – 2018-06-29 (×2): 2 [IU] via SUBCUTANEOUS

## 2018-06-25 MED ORDER — MECLIZINE HCL 12.5 MG PO TABS: 12.5000 mg | ORAL_TABLET | Freq: Four times a day (QID) | ORAL | Status: DC | PRN

## 2018-06-25 MED ORDER — ACETAMINOPHEN 160 MG/5ML PO SOLN: 650.0000 mg | ORAL | Status: DC | PRN

## 2018-06-25 MED ORDER — SODIUM CHLORIDE 0.9 % IV BOLUS
1000.0000 mL | Freq: Once | INTRAVENOUS | Status: AC
Start: 2018-06-25 — End: 2018-06-25
  Administered 2018-06-25: 1000 mL via INTRAVENOUS

## 2018-06-25 MED ORDER — ASPIRIN 300 MG RE SUPP
300.0000 mg | Freq: Every day | RECTAL | Status: DC
  Administered 2018-06-26: 300 mg via RECTAL
  Filled 2018-06-25 (×2): qty 1

## 2018-06-25 MED ORDER — POLYVINYL ALCOHOL 1.4 % OP SOLN: 1.0000 [drp] | Freq: Four times a day (QID) | OPHTHALMIC | Status: DC | PRN

## 2018-06-25 MED ORDER — ASPIRIN 325 MG PO TABS
325.0000 mg | ORAL_TABLET | Freq: Every day | ORAL | Status: DC
  Administered 2018-06-27 – 2018-06-28 (×2): 325 mg via ORAL
  Filled 2018-06-25 (×3): qty 1

## 2018-06-25 MED ORDER — ONDANSETRON HCL 4 MG/2ML IJ SOLN
4.0000 mg | Freq: Once | INTRAMUSCULAR | Status: AC
  Administered 2018-06-25: 4 mg via INTRAVENOUS

## 2018-06-25 MED ORDER — ONDANSETRON HCL 4 MG/2ML IJ SOLN
4.0000 mg | Freq: Four times a day (QID) | INTRAMUSCULAR | Status: DC | PRN
  Administered 2018-06-26: 4 mg via INTRAVENOUS
  Filled 2018-06-25: qty 2

## 2018-06-25 MED ORDER — ACETAMINOPHEN 650 MG RE SUPP: 650.0000 mg | RECTAL | Status: DC | PRN

## 2018-06-25 MED ORDER — PROMETHAZINE HCL 25 MG/ML IJ SOLN: 12.5000 mg | Freq: Four times a day (QID) | INTRAMUSCULAR | Status: DC | PRN

## 2018-06-25 MED ORDER — ENOXAPARIN SODIUM 40 MG/0.4ML ~~LOC~~ SOLN
40.0000 mg | SUBCUTANEOUS | Status: DC
  Administered 2018-06-26 – 2018-06-29 (×4): 40 mg via SUBCUTANEOUS
  Filled 2018-06-25 (×4): qty 0.4

## 2018-06-25 MED ORDER — DIAZEPAM 2 MG PO TABS
1.0000 mg | ORAL_TABLET | Freq: Every day | ORAL | Status: DC
  Administered 2018-06-27 – 2018-06-29 (×3): 1 mg via ORAL
  Filled 2018-06-25 (×3): qty 1

## 2018-06-25 MED ORDER — ACETAMINOPHEN 325 MG PO TABS
650.0000 mg | ORAL_TABLET | ORAL | Status: DC | PRN
  Administered 2018-06-28: 650 mg via ORAL
  Filled 2018-06-25: qty 2

## 2018-06-25 MED ORDER — CARBIDOPA-LEVODOPA ER 25-100 MG PO TBCR
1.0000 | EXTENDED_RELEASE_TABLET | Freq: Three times a day (TID) | ORAL | Status: DC
  Administered 2018-06-26 – 2018-06-29 (×10): 1 via ORAL
  Filled 2018-06-25 (×10): qty 1

## 2018-06-25 MED ORDER — SODIUM CHLORIDE 0.9 % IV SOLN
INTRAVENOUS | Status: AC
  Administered 2018-06-25: via INTRAVENOUS

## 2018-06-25 NOTE — ED Provider Notes (Signed)
[facility] EMERGENCY DEPARTMENT Provider Note   CSN: [id number] Arrival date & time: 06/25/18  1934     History   Chief Complaint Chief Complaint  Patient presents with  . Altered Mental Status    HPI Tracy [name] is a 84 y.o. female.  Patient brought in from her group home for evaluation of altered mental status/possible stroke.  Son thinks she had some sort of episode at lunchtime the possible facial droop.  Had another episode just prior to coming to the ED.  On arrival here the patient is vomiting.  The patient denies any complaints.  No headache no blurry vision or double vision no numbness no weakness no chest pain or shortness of breath no abdominal pain no urinary symptoms.  She does not want to when she is vomiting.   Altered Mental Status   This is a new problem. The current episode started less than 1 hour ago. The problem has been resolved. Pertinent negatives include no weakness.    Past Medical History:  Diagnosis Date  . Carpal tunnel syndrome   . 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 Diabetes mellitus   . Fibrocystic breast disease   . Fibromyalgia   . Focal dystonia   . GERD (gastroesophageal reflux disease)   . Hypertension   . Leukopenia   . Mild cognitive impairment   . RBBB   . Vitamin B12 deficiency     Patient Active Problem List   Diagnosis Date Noted  . Left spastic hemiparesis (HCC) 03/22/2018  . Abnormality of gait 04/28/2016  . Dysphagia, pharyngoesophageal phase 04/28/2016  . 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 RBBB (right bundle branch block with left anterior fascicular block) 08/07/2013  . Essential hypertension, benign 07/30/2013  . Mixed hyperlipidemia 07/30/2013  . Diabetes mellitus (HCC) 06/23/2013  . Focal dystonia 04/02/2013  . Dysarthria-clumsy hand syndrome 10/25/2012    Past Surgical History:  Procedure Laterality Date  . APPENDECTOMY    . CARDIAC  CATHETERIZATION     normal  . TONSILLECTOMY       OB History   No obstetric history on file.      Home Medications    Prior to Admission medications   Medication Sig Start Date End Date Taking? Authorizing Provider  ACCU-CHEK AVIVA PLUS test strip 1 each as needed.  06/27/14   [provider]  acetaminophen (TYLENOL) 500 MG tablet Take 500 mg by mouth every 6 (six) hours as needed.    [provider]  amLODipine (NORVASC) 5 MG tablet Take 5 mg by mouth daily.    [provider]  aspirin EC 81 MG tablet Take 81 mg by mouth daily.    [provider]  calcium carbonate (TUMS - DOSED IN MG ELEMENTAL CALCIUM) 500 MG chewable tablet Chew 1 tablet by mouth as needed for indigestion or heartburn.    [provider]  Carbidopa-Levodopa ER (SINEMET CR) 25-100 MG tablet controlled release TK ONE T PO TID 04/09/16   [provider]  cetirizine (ZYRTEC) 10 MG tablet Take 5 mg by mouth daily.    [provider]  diazepam (VALIUM) 2 MG tablet TAKE 1 TABLET BY MOUTH THREE TIMES DAILY Patient taking differently: TAKE 1/2 TABLET BY MOUTH  DAILY 08/17/16   York Spaniel, MD  Glucosamine-MSM-Hyaluronic Acd (JOINT HEALTH PO) Take 1 tablet by mouth daily.    [provider]  Hypromellose (ARTIFICIAL TEARS OP) Apply 1 drop to  eye 4 (four) times daily as needed.    [provider]  IncobotulinumtoxinA (XEOMIN IM) Inject into the muscle every 3 (three) months.    [provider]  loperamide (ANTI-DIARRHEAL) 2 MG tablet Take 2 mg by mouth daily as needed for diarrhea or loose stools.    [provider]  losartan (COZAAR) 50 MG tablet TK 1 T PO QD 09/28/15   [provider]  Melatonin 10 MG CAPS Take 10 mg by mouth at bedtime.    [provider]  metFORMIN (GLUCOPHAGE) 500 MG tablet Take 1 tablet (500 mg total) by mouth 2 (two) times daily with a meal. Do not restart Metformin until 7/12 am then  continue prior home dosing of 1 tablet in am, 1 tablet at noon and 2 tablets in PM Patient taking differently: Take 500 mg by mouth See admin instructions. Take with each meal ( 3 times daily) and before bed 01/04/12   Turner, [name], MD  multivitamin-lutein [facility](OCUVITE-LUTEIN) CAPS Take 1 capsule by mouth daily.    [provider]  nystatin cream (MYCOSTATIN) Apply 1 application topically as needed for dry skin.    [provider]  omeprazole (PRILOSEC) 10 MG capsule Take 10 mg by mouth daily.    [provider]  tiZANidine (ZANAFLEX) 2 MG tablet TAKE 1 TABLET BY MOUTH EVERY MORNING, 1 AT MIDDAY, AND 2 TABLETS IN THE EVENING Patient taking differently: TAKE 1 TABLET BY MOUTH two times daily 09/01/16   York SpanielWillis, [name], MD  tiZANidine (ZANAFLEX) 4 MG tablet Take 4 mg by mouth at bedtime.    [provider]  VESICARE 5 MG tablet Take 2.5 mg by mouth at bedtime. 03/17/15   [provider]  zinc oxide 20 % ointment Apply 1 application topically as needed for irritation.    [provider]    Family History Family History  Problem Relation Age of Onset  . CAD Father   . Stroke Other   . Liver cancer Other     Social History Social History   Tobacco Use  . Smoking status: Never Smoker  . Smokeless tobacco: Never Used  Substance Use Topics  . Alcohol use: No  . Drug use: No     Allergies   Mold extract [trichophyton]; Other; Ace inhibitors; Codeine; Darvon [propoxyphene]; Novocain [procaine hcl]; and Xylocaine jelly [lidocaine hcl]   Review of Systems Review of Systems  Constitutional: Negative for fever.  HENT: Negative for sore throat.   Eyes: Negative for visual disturbance.  Respiratory: Negative for shortness of breath.   Cardiovascular: Negative for chest pain.  Gastrointestinal: Positive for nausea and vomiting. Negative for abdominal pain and diarrhea.  Genitourinary: Negative for dysuria.  Musculoskeletal: Negative for  neck pain.  Skin: Negative for rash.  Neurological: Negative for weakness, numbness and headaches.     Physical Exam Updated Vital Signs BP (!) 162/84   Pulse 100   Temp 98.6 F (37 C) (Oral)   Resp 16   SpO2 95%   Physical Exam Vitals signs and nursing note reviewed.  Constitutional:      General: She is not in acute distress.    Appearance: She is well-developed.  HENT:     Head: Normocephalic and atraumatic.  Eyes:     Conjunctiva/sclera: Conjunctivae normal.  Neck:     Musculoskeletal: Neck supple.  Cardiovascular:     Rate and Rhythm: Normal rate and regular rhythm.     Heart sounds: No murmur.  Pulmonary:     Effort: Pulmonary effort is normal. No respiratory distress.     Breath sounds: Normal breath sounds.  Abdominal:     Palpations: Abdomen is soft.     Tenderness: There is no abdominal tenderness.  Musculoskeletal:        General: No tenderness or signs of injury.     Right lower leg: No edema.     Left lower leg: No edema.  Skin:    General: Skin is warm and dry.     Capillary Refill: Capillary refill takes less than 2 seconds.  Neurological:     Mental Status: She is alert.     Comments: Patient has some spasticity and no use of her left upper extremity.  The other 3 extremities are full strength and sensation.  She has no obvious cranial nerve findings.  She speaks with a very quiet voice but is answering questions appropriately and following commands.      ED Treatments / Results  Labs (all labs ordered are listed, but only abnormal results are displayed) Labs Reviewed  COMPREHENSIVE METABOLIC PANEL - Abnormal; Notable for the following components:      Result Value   Potassium 3.4 (*)    Glucose, Bld 176 (*)    Calcium 6.7 (*)    Alkaline Phosphatase 37 (*)    All other components within normal limits  CBC WITH DIFFERENTIAL/PLATELET - Abnormal; Notable for the following components:   WBC 13.2 (*)    Platelets 485 (*)    Neutro Abs 10.0  (*)    All other components within normal limits  GLUCOSE, CAPILLARY - Abnormal; Notable for the following components:   Glucose-Capillary 170 (*)    All other components within normal limits  GLUCOSE, CAPILLARY - Abnormal; Notable for the following components:   Glucose-Capillary 102 (*)    All other components within normal limits  GLUCOSE, CAPILLARY - Abnormal; Notable for the following components:   Glucose-Capillary 113 (*)    All other components within normal limits  GLUCOSE, CAPILLARY - Abnormal; Notable for the following components:   Glucose-Capillary 115 (*)    All other components within normal limits  MRSA PCR SCREENING  TROPONIN I  LIPASE, BLOOD  ETHANOL  PROTIME-INR  APTT  HEMOGLOBIN A1C  LIPID PANEL  URINALYSIS, ROUTINE W REFLEX MICROSCOPIC  RAPID URINE DRUG SCREEN, HOSP PERFORMED  I-STAT CHEM 8, ED  I-STAT TROPONIN, ED    EKG None  Radiology Mr Brain Wo Contrast  Result Date: 06/26/2018 CLINICAL DATA:  Altered mental status. EXAM: MRI HEAD WITHOUT CONTRAST TECHNIQUE: Multiplanar, multiecho pulse sequences of the brain and surrounding structures were obtained without intravenous contrast. COMPARISON:  Head CT 06/25/2018 and MRI 03/27/2014 FINDINGS: The study is mildly to moderately motion degraded throughout. Brain: There is no evidence of acute infarct, intracranial hemorrhage, mass, midline shift, or extra-axial fluid collection. Generalized cerebral atrophy is mild for age. Patchy cerebral white matter T2 hyperintensities are nonspecific but compatible with mild-to-moderate chronic small vessel ischemic disease, likely mildly progressed from 2015. Vascular: Major intracranial vascular flow voids are preserved. Skull and upper cervical spine: No suspicious marrow lesion. Sinuses/Orbits: Unremarkable orbits. Clear paranasal sinuses. Trace bilateral mastoid effusions. Other: None. IMPRESSION: 1. Motion degraded examination without evidence of acute intracranial  abnormality. 2. Mild-to-moderate chronic small vessel ischemic disease. Electronically Signed   By: Sebastian AcheAllen  Grady M.D.   On: 06/26/2018 08:46   [place] Carotid Bilateral  Result Date: 06/26/2018 CLINICAL DATA:  Transient neurologic deficit.  History of hypertension and diabetes. EXAM: BILATERAL CAROTID DUPLEX ULTRASOUND TECHNIQUE: Wallace Cullens scale imaging, color Doppler and duplex ultrasound were performed of bilateral carotid and vertebral arteries in the neck. COMPARISON:  None. FINDINGS: Criteria: Quantification of carotid stenosis is based on velocity parameters that correlate the residual internal carotid diameter with NASCET-based stenosis levels, using the diameter of the distal internal carotid lumen as the denominator for stenosis measurement. The following velocity measurements were obtained: RIGHT ICA: 105/21 cm/sec CCA: 126/15 cm/sec SYSTOLIC ICA/CCA RATIO:  0.8 ECA: 143 cm/sec LEFT ICA: 103/21 cm/sec CCA: 106/16 cm/sec SYSTOLIC ICA/CCA RATIO:  1.0 ECA: 150 cm/sec RIGHT CAROTID ARTERY: There is a minimal amount of eccentric mixed echogenic plaque within the right carotid bulb (image 19), extending to involve the origin and proximal aspects of the right internal carotid artery (image 31, not resulting in elevated peak systolic velocities within the interrogated course the right internal carotid artery to suggest a hemodynamically significant stenosis. RIGHT VERTEBRAL ARTERY:  Not visualized LEFT CAROTID ARTERY: There is a moderate amount of eccentric echogenic partially shadowing plaque within the left carotid bulb (image 56), extending to involve the origin and proximal aspect the left internal carotid artery (image 65, not resulting in elevated peak systolic velocities within the interrogated course the left internal carotid artery to suggest a hemodynamically significant stenosis. LEFT VERTEBRAL ARTERY:  Antegrade Flow IMPRESSION: 1. Minimal to moderate amount of bilateral atherosclerotic plaque, left greater  than right, not resulting in a hemodynamically significant stenosis within either internal carotid artery. 2. Non visualization of a patent right vertebral artery, potentially technique related due to patient limited mobility and of uncertain clinical significance as a dominant and/or solitary left vertebral artery is a relatively common asymptomatic congenital variant. Electronically Signed   By: Simonne Come M.D.   On: 06/26/2018 10:20   Dg Chest Port 1 View  Result Date: 06/25/2018 CLINICAL DATA:  Acute onset of respiratory failure. Hypoxia. Altered mental status. EXAM: PORTABLE CHEST 1 VIEW COMPARISON:  Chest radiograph performed 08/13/2016 FINDINGS: The lungs are well-aerated. Minimal right basilar airspace opacity could reflect mild pneumonia. There is no evidence of pleural effusion or pneumothorax. The cardiomediastinal silhouette is mildly enlarged. No acute osseous abnormalities are seen. IMPRESSION: Minimal right basilar airspace opacity could reflect mild pneumonia. Mild cardiomegaly noted. Electronically Signed   By: Roanna Raider M.D.   On: 06/25/2018 22:18   Ct Head Code Stroke Wo Contrast  Result Date: 06/25/2018 CLINICAL DATA:  Code stroke. Altered mental status. Transient facial droop earlier today. Vomiting. EXAM: CT HEAD WITHOUT CONTRAST TECHNIQUE: Contiguous axial images were obtained from the base of the skull through the vertex without intravenous contrast. COMPARISON:  06/02/2014 head CT and 03/27/2014 MRI FINDINGS: Brain: There is no evidence of acute infarct, intracranial hemorrhage, mass, midline shift, or extra-axial fluid collection. There is mild cerebral atrophy. Cerebral white matter hypodensities are stable to slightly progressive and nonspecific but compatible with mild-to-moderate chronic small vessel ischemic disease. Vascular: No hyperdense vessel. Skull: No fracture or focal osseous lesion allowing for motion artifact through the skull base. Sinuses/Orbits: Visualized  paranasal sinuses and mastoid air cells are grossly clear. Orbits are grossly unremarkable. Other: None. ASPECTS [facility] Stroke Program Early CT Score) - Ganglionic level infarction (caudate, lentiform nuclei, internal capsule, insula, M1-M3 cortex): 7 - Supraganglionic infarction (M4-M6 cortex): 3 Total score (0-10 with 10 being normal): 10 IMPRESSION: 1. No evidence of acute intracranial abnormality. 2. ASPECTS is 10. 3. Mild-to-moderate chronic small vessel ischemic disease. These results  were called by telephone at the time of interpretation on 06/25/2018 at 8:29 pm to Dr. Meridee Score , who verbally acknowledged these results. Electronically Signed   By: Sebastian Ache M.D.   On: 06/25/2018 20:29    Procedures Procedures (including critical care time)  Medications Ordered in ED Medications  sodium chloride 0.9 % bolus 1,000 mL (has no administration in time range)  ondansetron (ZOFRAN) injection 4 mg (4 mg Intravenous Given 06/25/18 1945)     Initial Impression / Assessment and Plan / ED Course  I have reviewed the triage vital signs and the nursing notes.  Pertinent labs & imaging results that were available during my care of the patient were reviewed by me and considered in my medical decision making (see chart for details).  Clinical Course as of Jun 26 1328  Sun Jun 25, 2018  2007 Staff member from the facility is here negative completely different story.  They said around 12:00 she had acute onset of right face right arm droop and staring that resolved in about 10 minutes.  They elected not to bring her to the hospital.  She had another episode that happened again just after dinner about 6:30 PM.  There was right facial droop and right arm drop.  This lasted about 10 minutes and completely resolved by the time EMS got there.  The patient vomited during transport and here denies any complaints.  When I asked the patient why she did not tell me she had the symptoms she just smiled at me.   I think she was minimizing her symptoms to be able to get home.   [MB]  2022 Due to this new information I have activated a code stroke on the patient.  I am not appreciating any new deficits at this time but would appreciate neurology input.   [MB]  2033 Discussed with tele-neurology who does not feel the patient is a TPA candidate.  She is not sure if this is a TIA or other some other toxic metabolic thing that is causing her symptoms.  Ultimately her recommendation is going to be continue medical work-up but she does say that she will need an MRI tomorrow.   [MB]  2139 Discussed with Dr. Antionette Char from [facility] hospitalist who will evaluate the patient for admission.   [MB]    Clinical Course User Index [MB] Terrilee Files, MD     Final Clinical Impressions(s) / ED Diagnoses   Final diagnoses:  TIA (transient ischemic attack)  Non-intractable vomiting with nausea, unspecified vomiting type    ED Discharge Orders    None       Terrilee Files, MD 06/26/18 1331

## 2018-06-25 NOTE — H&P (Addendum)
History and Physical    Tracy Alimentancy P Belter [id number] DOB: March 23, 1934 DOA: 06/25/2018  PCP: Benita StabileHall, John Z, MD   Patient coming from: [facility]   Chief Complaint: Episode of decreased LOC with right facial droop x2   HPI: Tracy [name] is a 84 y.o. 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  Patient is accompanied by her caregiver who assists with the history.  She reportedly been in her usual state of health today, had just finished her lunch, when she was noted to be slumped over to her right, poorly responsive, and with right facial droop.  There was no fall or trauma reported prior to this and there was no seizure-like activity witnessed.  Patient returned to her usual state within approximately 15 minutes, but went on to have another very similar episode in the evening that prompted her presentation to the ED.  Patient does not recall the events, has no complaints in the emergency department, denies any recent headache, change in vision or hearing, or new focal numbness or weakness.  She denies any chest pain or palpitations.  No recent fevers or chills.  She denies any abdominal pain or diarrhea, and reports that her vomiting in the ED is secondary to motion sickness from the ambulance ride.  ED Course: Upon arrival to the ED, patient is found to be afebrile, saturating well on room air, and with vitals otherwise stable.  Noncontrast head CT is negative for acute intracranial abnormality.  Chemistry panel is notable for glucose 176 and calcium 6.7.  CBC features a leukocytosis to 13,200 and thrombocytosis with platelets of 495,000.  Troponin is undetectable.  Tele-neurology was consulted and recommended admission for further evaluation and management of possible CVA or  TIA.  Review of Systems:  All other systems reviewed and apart from HPI, are negative.  Past Medical History:  Diagnosis Date  . Carpal tunnel syndrome   . 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 Diabetes mellitus   . Fibrocystic breast disease   . Fibromyalgia   . Focal dystonia   . GERD (gastroesophageal reflux disease)   . Hypertension   . Leukopenia   . Mild cognitive impairment   . RBBB   . Vitamin B12 deficiency     Past Surgical History:  Procedure Laterality Date  . APPENDECTOMY    . CARDIAC CATHETERIZATION     normal  . TONSILLECTOMY       reports that she has never smoked. She has never used smokeless tobacco. She reports that she does not drink alcohol or use drugs.  Allergies  Allergen Reactions  . Mold Extract [Trichophyton] Anaphylaxis    sneezing  . Other Other (See Comments) and Anaphylaxis    Headaches, breathing issues NOVACAINE - shock Headaches, breathing issues  . Ace Inhibitors Cough  . Codeine Nausea And Vomiting  . Darvon [Propoxyphene] Nausea And Vomiting and Other (See Comments)  . Novocain [Procaine Hcl] Swelling    BP drop  . Xylocaine Jelly [Lidocaine Hcl]     unknown    Family History  Problem Relation Age of Onset  . CAD Father   . Stroke Other   . Liver cancer Other      Prior to Admission medications   Medication Sig Start Date End Date Taking? Authorizing Provider  ACCU-CHEK AVIVA PLUS test strip  1 each as needed.  06/27/14  Yes [provider]  acetaminophen (TYLENOL) 500 MG tablet Take 500 mg by mouth every 6 (six) hours as needed.   Yes [provider]  amLODipine (NORVASC) 5 MG tablet Take 5 mg by mouth daily.   Yes [provider]  calcium carbonate (TUMS - DOSED IN MG ELEMENTAL CALCIUM) 500 MG chewable tablet Chew 2 tablets by mouth 2 (two) times daily.    Yes [provider]  Carbidopa-Levodopa ER (SINEMET CR) 25-100 MG tablet controlled release TK ONE T PO TID 04/09/16   Yes [provider]  cetirizine (ZYRTEC) 10 MG tablet Take 5 mg by mouth daily.   Yes [provider]  diazepam (VALIUM) 2 MG tablet TAKE 1 TABLET BY MOUTH THREE TIMES DAILY Patient taking differently: TAKE 1/2 TABLET BY MOUTH  DAILY 08/17/16  Yes York SpanielWillis, [name], MD  Hypromellose (ARTIFICIAL TEARS OP) Apply 1 drop to eye 4 (four) times daily as needed.   Yes [provider]  IncobotulinumtoxinA (XEOMIN IM) Inject into the muscle every 3 (three) months.   Yes [provider]  losartan (COZAAR) 50 MG tablet TK 1 T PO QD 09/28/15  Yes [provider]  meclizine (ANTIVERT) 12.5 MG tablet Take 12.5 mg by mouth every 6 (six) hours as needed for dizziness.   Yes [provider]  nystatin cream (MYCOSTATIN) Apply 1 application topically as needed for dry skin.   Yes [provider]  VESICARE 5 MG tablet Take 2.5 mg by mouth at bedtime. 03/17/15  Yes [provider]  zinc oxide 20 % ointment Apply 1 application topically as needed for irritation.   Yes [provider]    Physical Exam: Vitals:   06/25/18 2100 06/25/18 2115 06/25/18 2130 06/25/18 2145  BP: (!) 163/70 (!) 162/66 (!) 167/65 (!) 144/110  Pulse: 98 99 100 99  Resp: 20 19 17 16   Temp:      TempSrc:      SpO2: 98% (!) 85% 100% 97%    Constitutional: NAD, calm  Eyes: PERTLA, lids and conjunctivae normal ENMT: Mucous membranes are moist. Posterior pharynx clear of any exudate or lesions.   Neck: normal, supple, no masses, no thyromegaly Respiratory: clear to auscultation bilaterally, no wheezing, no crackles. Normal respiratory effort.   Cardiovascular: S1 & S2 heard, regular rate and rhythm. No extremity edema.   Abdomen: No distension, no tenderness, soft. Bowel sounds active.  Musculoskeletal: no clubbing / cyanosis. No joint deformity upper and lower extremities.  Skin: no significant rashes, lesions, ulcers. Warm, dry, well-perfused. Neurologic:  PERRL, EOMI, hearing and visual acuity grossly intact. No dysarthria. Subtle right lower facial weakness. Sensation intact. Left arm flaccid. Rigidity on right. Strength 5/5 in bilateral LE's.  Psychiatric: Alert and oriented to person, place, and situation. Pleasant and cooperative.    Labs on Admission: I have personally reviewed following labs and imaging studies  CBC: Recent Labs  Lab 06/25/18 2020  WBC 13.2*  NEUTROABS 10.0*  HGB 12.0  HCT 36.4  MCV 92.6  PLT 485*   Basic Metabolic Panel: Recent Labs  Lab 06/25/18 2020  NA 139  K 3.4*  CL 103  CO2 25  GLUCOSE 176*  BUN 16  CREATININE 0.76  CALCIUM 6.7*   GFR: CrCl cannot be calculated (Unknown ideal weight.). Liver Function Tests: Recent Labs  Lab 06/25/18 2020  AST 20  ALT 10  ALKPHOS 37*  BILITOT 0.5  PROT 6.8  ALBUMIN 3.8  Recent Labs  Lab 06/25/18 2020  LIPASE 36   No results for input(s): AMMONIA in the last 168 hours. Coagulation Profile: Recent Labs  Lab 06/25/18 2021  INR 0.92   Cardiac Enzymes: Recent Labs  Lab 06/25/18 2020  TROPONINI <0.03   BNP (last 3 results) No results for input(s): PROBNP in the last 8760 hours. HbA1C: No results for input(s): HGBA1C in the last 72 hours. CBG: No results for input(s): GLUCAP in the last 168 hours. Lipid Profile: No results for input(s): CHOL, HDL, LDLCALC, TRIG, CHOLHDL, LDLDIRECT in the last 72 hours. Thyroid Function Tests: No results for input(s): TSH, T4TOTAL, FREET4, T3FREE, THYROIDAB in the last 72 hours. Anemia Panel: No results for input(s): VITAMINB12, FOLATE, FERRITIN, TIBC, IRON, RETICCTPCT in the last 72 hours. Urine analysis:    Component Value Date/Time   COLORURINE YELLOW 01/27/2015 1513   APPEARANCEUR CLEAR 01/27/2015 1513   LABSPEC 1.009 01/27/2015 1513   PHURINE 7.5 01/27/2015 1513   GLUCOSEU NEGATIVE 01/27/2015 1513   HGBUR NEGATIVE 01/27/2015 1513   BILIRUBINUR NEGATIVE 01/27/2015 1513   KETONESUR NEGATIVE  01/27/2015 1513   PROTEINUR NEGATIVE 01/27/2015 1513   UROBILINOGEN 0.2 01/27/2015 1513   NITRITE NEGATIVE 01/27/2015 1513   LEUKOCYTESUR MODERATE (A) 01/27/2015 1513   Sepsis Labs: @LABRCNTIP (procalcitonin:4,lacticidven:4) )No results found for this or any previous visit (from the past 240 hour(s)).   Radiological Exams on Admission: Ct Head Code Stroke Wo Contrast  Result Date: 06/25/2018 CLINICAL DATA:  Code stroke. Altered mental status. Transient facial droop earlier today. Vomiting. EXAM: CT HEAD WITHOUT CONTRAST TECHNIQUE: Contiguous axial images were obtained from the base of the skull through the vertex without intravenous contrast. COMPARISON:  06/02/2014 head CT and 03/27/2014 MRI FINDINGS: Brain: There is no evidence of acute infarct, intracranial hemorrhage, mass, midline shift, or extra-axial fluid collection. There is mild cerebral atrophy. Cerebral white matter hypodensities are stable to slightly progressive and nonspecific but compatible with mild-to-moderate chronic small vessel ischemic disease. Vascular: No hyperdense vessel. Skull: No fracture or focal osseous lesion allowing for motion artifact through the skull base. Sinuses/Orbits: Visualized paranasal sinuses and mastoid air cells are grossly clear. Orbits are grossly unremarkable. Other: None. ASPECTS [facility] Stroke Program Early CT Score) - Ganglionic level infarction (caudate, lentiform nuclei, internal capsule, insula, M1-M3 cortex): 7 - Supraganglionic infarction (M4-M6 cortex): 3 Total score (0-10 with 10 being normal): 10 IMPRESSION: 1. No evidence of acute intracranial abnormality. 2. ASPECTS is 10. 3. Mild-to-moderate chronic small vessel ischemic disease. These results were called by telephone at the time of interpretation on 06/25/2018 at 8:29 pm to Dr. Meridee Score , who verbally acknowledged these results. Electronically Signed   By: Sebastian Ache M.D.   On: 06/25/2018 20:29    EKG: Ordered, not yet  performed.   Assessment/Plan   1. Transient neurologic symptoms  - Presents for evaluation of 2 episodes, ~15 minutes each, marked by poor responsiveness and right facial droop, no seizure-like activity  - The patient has no complaints and seems to have returned to baseline by time of admission  - No acute findings on head CT  - Continue cardiac monitoring with frequent neuro checks  - Check EKG, MRI brain, carotid US, echocardiogram, fasting lipids, and A1c  - Start ASA, NPO pending swallow screen, PT/OT/SLP consults   2. 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 Nausea and vomiting  -  Abdominal exam is benign  - Patient reports this is secondary to motion sickness from ambulance ride  - Continue supportive care, monitor lytes    4. Hypertension  - Hold antihypertensives initially while evaluating for possible acute ischemic CVA   5. Type II DM  - No A1c on file - Has been diet-controlled recently, no longer on metformin  - Check CBG's and use a low-intensity SSI with Novolog as needed for now     DVT prophylaxis: Lovenox   Code Status: Full   Family Communication: Caregiver, [place]annah, updated at bedside Consults called: Neurology Admission status: Observation     Briscoe Deutscher, MD [facility] Pager [phone]  If 7PM-7AM, please contact night-coverage [email or web address] Password [facility]  06/25/2018, 9:53 PM

## 2018-06-25 NOTE — ED Notes (Signed)
Pt projectile vomiting. Had already vomited once en route with EMS. Dr Charm BargesButler made aware and pt given 4mg  Zofran IV. Pt cleaned up but continues to act like she is going to be sick at stomach. Will continue to monitor. Pt flaccid with LUE and very stiff with RUE. Able to follow commands such as putting legs flat in bed when asked to do so because knees were bent. No one at bedside to get more info from at this time.

## 2018-06-25 NOTE — Consult Note (Signed)
[facility] TeleNeurology Consult Services   Date of Service:   06/25/2018 20:12:20  Impression:     .  RO Acute Ischemic Stroke  Comments: 84 year old female who presents with 2 episodes unresponsiveness with right facial droop. Presentation is concerning recurrent TIA vs small stroke.  Mechanism of Stroke: Not Clear  Metrics: Last Known Well: 06/25/2018 18:15:00 [facility] Notification Time: 06/25/2018 20:11:45 Arrival Time: 06/25/2018 19:34:00 Stamp Time: 06/25/2018 20:12:20 Time First Login Attempt: 06/25/2018 20:17:00 Video Start Time: 06/25/2018 20:17:00  Symptoms: Altered mental status NIHSS Start Assessment Time: 06/25/2018 20:25:52 Patient is not a candidate for tPA. Patient was not deemed candidate for tPA thrombolytics because of Resolved symptoms. Video End Time: 06/25/2018 20:30:14  CT head was reviewed.  Advanced imaging was not obtained as the presentation was not suggestive of Large Vessel Occlusive Disease.   ER Physician notified of the decision on thrombolytics management on 06/25/2018 20:31:01  Our recommendations are outlined below.  Recommendations:     .  Activate Stroke Protocol Admission/Order Set     .  Stroke/Telemetry Floor     .  Neuro Checks     .  Bedside Swallow Eval     .  DVT Prophylaxis     .  IV Fluids, Normal Saline     .  Head of Bed Below 30 Degrees     .  Euglycemia and Avoid Hyperthermia (PRN Acetaminophen)     .  Antiplatelet Therapy Recommended  Routine Consultation with Inhouse Neurology for Follow up Care  Sign Out:     .  Discussed with Emergency Department Provider    ------------------------------------------------------------------------------  History of Present Illness: Patient is a 84 year old Female.  Patient was brought by EMS for symptoms of Altered mental status  84 year old female who presents to the hospital because of 2 episodes of decreased responsiveness and right  facial droop. This first episode was around 12:30 this afternoon and lasted approximately 10-15 minutes. Again at 18:30 she was noted to be less responsive and have right facial droop. Symptoms again lasted approximately 10-15 minutes and on arrival she is back to baseline. En route to the hospital patient became nauseous but she has a known history of vertigo. Patient also has a history of corticobasilar degeneration and does not move her left arm at baseline.  CT head was reviewed.    Examination: 1A: Level of Consciousness - Alert; keenly responsive + 0 1B: Ask Month and Age - Both Questions Right + 0 1C: Blink Eyes & Squeeze Hands - Performs Both Tasks + 0 2: Test Horizontal Extraocular Movements - Normal + 0 3: Test Visual Fields - No Visual Loss + 0 4: Test Facial Palsy (Use Grimace if Obtunded) - Normal symmetry + 0 5A: Test Left Arm Motor Drift - No Movement + 4 5B: Test Right Arm Motor Drift - No Drift for 10 Seconds + 0 6A: Test Left Leg Motor Drift - No Drift for 5 Seconds + 0 6B: Test Right Leg Motor Drift - No Drift for 5 Seconds + 0 7: Test Limb Ataxia (FNF/Heel-Shin) - No Ataxia + 0 8: Test Sensation - Normal; No sensory loss + 0 9: Test Language/Aphasia - Normal; No aphasia + 0 10: Test Dysarthria - Normal + 0 11: Test Extinction/Inattention - No abnormality + 0  NIHSS Score: 4  Patient was informed the Neurology Consult would happen via TeleHealth consult by way of interactive audio and video telecommunications and consented to  receiving care in this manner.  Due to the immediate potential for life-threatening deterioration due to underlying acute neurologic illness, I spent 35 minutes providing critical care. This time includes time for face to face visit via telemedicine, review of medical records, imaging studies and discussion of findings with providers, the patient and/or family.   Dr Joice LoftsGeetanjali Anoushka Divito   [facility] [phone]  Case [id number]

## 2018-06-25 NOTE — ED Notes (Signed)
Pt cannot complete swallow screen as she continues to vomit.

## 2018-06-25 NOTE — Progress Notes (Signed)
CODE STROKE PROTOCOL  Tracy [name] ER [phone]  Call time 2004 Beeper  none Exam started    2020 Exam finished,[facility]: 2020  CT Head Code Stroke - for patients within the Code Stroke Window  [name] Dr Casimiro Needlemichael butler  [phone]

## 2018-06-25 NOTE — ED Triage Notes (Signed)
Pt brought in by [facility] for c/o altered mental status; [facility] staff reported to ems the first episode happened at lunchtime with pt having facial droop and resolved in 15 minutes; pt had another episode just prior to ems arrival; pt is alert and actively vomiting

## 2018-06-25 NOTE — ED Notes (Signed)
CODE STROKE per DR Rayfield CitizenBULTER. Teleneuro cart activated @ 2003. Stoke paged out @ 2003

## 2018-06-25 NOTE — ED Notes (Signed)
Called to pt's room by caregiver. Caregiver states pt's O2 sats dropped to 50's. O2 sats were 82% when this nurse entered room. Placed pt on O2 at 3L nasal cannula. Sats up to 94%. Pt in no apparent distress. Dr Charm BargesButler notified. No new orders given.

## 2018-06-26 ENCOUNTER — Observation Stay (HOSPITAL_COMMUNITY): Payer: Medicare Other

## 2018-06-26 ENCOUNTER — Other Ambulatory Visit: Payer: Self-pay

## 2018-06-26 ENCOUNTER — Observation Stay (HOSPITAL_BASED_OUTPATIENT_CLINIC_OR_DEPARTMENT_OTHER): Payer: Medicare Other

## 2018-06-26 DIAGNOSIS — I10 Essential (primary) hypertension: Secondary | ICD-10-CM | POA: Diagnosis not present

## 2018-06-26 DIAGNOSIS — R29818 Other symptoms and signs involving the nervous system: Secondary | ICD-10-CM | POA: Diagnosis not present

## 2018-06-26 LAB — ECHOCARDIOGRAM COMPLETE
Height: 65 in
Weight: 2003.54 oz

## 2018-06-26 LAB — RAPID URINE DRUG SCREEN, HOSP PERFORMED
Amphetamines: NOT DETECTED
Barbiturates: NOT DETECTED
Benzodiazepines: POSITIVE — AB
Cocaine: NOT DETECTED
Opiates: NOT DETECTED
Tetrahydrocannabinol: NOT DETECTED

## 2018-06-26 LAB — URINALYSIS, ROUTINE W REFLEX MICROSCOPIC
Bilirubin Urine: NEGATIVE
Glucose, UA: NEGATIVE mg/dL
Hgb urine dipstick: NEGATIVE
KETONES UR: 20 mg/dL — AB
Leukocytes, UA: NEGATIVE
Nitrite: NEGATIVE
PH: 6 (ref 5.0–8.0)
PROTEIN: NEGATIVE mg/dL
Specific Gravity, Urine: 1.01 (ref 1.005–1.030)

## 2018-06-26 LAB — GLUCOSE, CAPILLARY
GLUCOSE-CAPILLARY: 102 mg/dL — AB (ref 70–99)
Glucose-Capillary: 109 mg/dL — ABNORMAL HIGH (ref 70–99)
Glucose-Capillary: 113 mg/dL — ABNORMAL HIGH (ref 70–99)
Glucose-Capillary: 115 mg/dL — ABNORMAL HIGH (ref 70–99)
Glucose-Capillary: 164 mg/dL — ABNORMAL HIGH (ref 70–99)
Glucose-Capillary: 170 mg/dL — ABNORMAL HIGH (ref 70–99)

## 2018-06-26 LAB — HEMOGLOBIN A1C
Hgb A1c MFr Bld: 5.3 % (ref 4.8–5.6)
Mean Plasma Glucose: 105.41 mg/dL

## 2018-06-26 LAB — LIPID PANEL
Cholesterol: 127 mg/dL (ref 0–200)
HDL: 42 mg/dL (ref >40)
LDL Cholesterol: 74 mg/dL (ref 0–99)
Total CHOL/HDL Ratio: 3 RATIO
Triglycerides: 55 mg/dL (ref <150)
VLDL: 11 mg/dL (ref 0–40)

## 2018-06-26 LAB — MRSA PCR SCREENING: MRSA BY PCR: NEGATIVE

## 2018-06-26 MED ORDER — MECLIZINE HCL 12.5 MG PO TABS
25.0000 mg | ORAL_TABLET | Freq: Two times a day (BID) | ORAL | Status: DC
  Administered 2018-06-26 – 2018-06-29 (×7): 25 mg via ORAL
  Filled 2018-06-26 (×7): qty 2

## 2018-06-26 NOTE — Evaluation (Signed)
Clinical/Bedside Swallow Evaluation Patient Details  Name: Tracy [name] MRN: [id number] Date of Birth: January 10, 1934  Today's Date: 06/26/2018 Time: SLP Start Time (ACUTE ONLY): 1350 SLP Stop Time (ACUTE ONLY): 1420 SLP Time Calculation (min) (ACUTE ONLY): 30 min  Past Medical History:  Past Medical History:  Diagnosis Date  . Carpal tunnel syndrome   . 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 Diabetes mellitus   . Fibrocystic breast disease   . Fibromyalgia   . Focal dystonia   . GERD (gastroesophageal reflux disease)   . Hypertension   . Leukopenia   . Mild cognitive impairment   . RBBB   . Vitamin B12 deficiency    Past Surgical History:  Past Surgical History:  Procedure Laterality Date  . APPENDECTOMY    . CARDIAC CATHETERIZATION     normal  . TONSILLECTOMY     HPI:  Tracy [name] is a 84 y.o. 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  Patient is accompanied by her caregiver who assists with the history.  She reportedly been in her usual state of health today, had just finished her lunch, when she was noted to be slumped over to her right, poorly responsive, and with right facial droop.  There was no fall or trauma reported prior to this and there was no seizure-like activity witnessed.  Patient returned to her usual state within approximately 15 minutes, but went on to have another very similar episode in the evening that prompted her presentation to the ED.  Patient does not recall the events, has no complaints in the emergency department, denies any recent headache, change in vision or hearing, or new focal numbness or weakness.  She denies any chest pain or palpitations.  No recent fevers or chills.  She denies any abdominal pain or diarrhea, and  reports that her vomiting in the ED is secondary to motion sickness from the ambulance ride. BSE requested due to Pt failing [facility] screen. The Pt had MBSS in February 2017 with recommendation for D3/thin.   Assessment / Plan / Recommendation Clinical Impression  Clinical swallow evaluation completed at bedside. Pt tells SLP that she has always had some trouble swallowing due to "rare Parkinson's". Pt tells me that her spouse lives in [place] and that she is from [place], however [facility]. Oral motor examination reveals WNL strength and ROM, but with delayed execution of movements. Pt with mild hypokinetic dysarthria. She presents with immediate cough after ice chip presentations and tsp/cup sips thin water. Pt with improved performance with NTL and puree. She was able to self present with min hand over hand assist for use of her right hand. She did need cues to take smaller sips. Will iniitate D1/puree and NTL with aspiration precautions and supervision with meals. Pt missed her AM dose of her medications, which could be impacting her swallowing due to PD. SLP will follow tomorrow AM for anticipated diet upgrade. Medications can be presented whole in puree. SLP Visit Diagnosis: Dysphagia, oropharyngeal phase (R13.12)    Aspiration Risk  Mild aspiration risk    Diet Recommendation Dysphagia 1 (Puree);Nectar-thick liquid   Liquid Administration via: Cup;Straw Medication Administration: Whole meds with puree Supervision: Patient able to self feed;Staff to assist with self feeding;Full supervision/cueing for compensatory strategies Compensations: Slow rate;Small sips/bites Postural Changes:  Seated upright at 90 degrees;Remain upright for at least 30 minutes after po intake    Other  Recommendations Oral Care Recommendations: Oral care BID;Staff/trained caregiver to provide oral care Other Recommendations: Clarify dietary restrictions   Follow up  Recommendations 24 hour supervision/assistance      Frequency and Duration min 2x/week  1 week       Prognosis Prognosis for Safe Diet Advancement: Fair Barriers to Reach Goals: Severity of deficits      Swallow Study   General Date of Onset: 06/25/18 HPI: Tracy [name] is a 84 y.o. 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  Patient is accompanied by her caregiver who assists with the history.  She reportedly been in her usual state of health today, had just finished her lunch, when she was noted to be slumped over to her right, poorly responsive, and with right facial droop.  There was no fall or trauma reported prior to this and there was no seizure-like activity witnessed.  Patient returned to her usual state within approximately 15 minutes, but went on to have another very similar episode in the evening that prompted her presentation to the ED.  Patient does not recall the events, has no complaints in the emergency department, denies any recent headache, change in vision or hearing, or new focal numbness or weakness.  She denies any chest pain or palpitations.  No recent fevers or chills.  She denies any abdominal pain or diarrhea, and reports that her vomiting in the ED is secondary to motion sickness from the ambulance ride. BSE requested due to Pt failing [facility] screen. The Pt had MBSS in February 2017 with recommendation for D3/thin. Type of Study: Bedside Swallow Evaluation Previous Swallow Assessment: MBSS 07/2015 D3 and thin Diet Prior to this Study: NPO Temperature Spikes Noted: No Respiratory Status: Room air History of Recent Intubation: No Behavior/Cognition: Alert;Cooperative;Pleasant mood Oral Cavity Assessment: Within Functional Limits Oral Care Completed  by SLP: Yes Oral Cavity - Dentition: Adequate natural dentition Vision: Functional for self-feeding Self-Feeding Abilities: Needs set up(using RUE) Patient Positioning: Upright in bed Baseline Vocal Quality: Normal;Low vocal intensity Volitional Cough: Weak Volitional Swallow: Able to elicit    Oral/Motor/Sensory Function Overall Oral Motor/Sensory Function: Within functional limits   Ice Chips Ice chips: Impaired Presentation: Spoon Pharyngeal Phase Impairments: Cough - Immediate   Thin Liquid Thin Liquid: Impaired Presentation: Cup;Spoon Pharyngeal  Phase Impairments: Cough - Immediate    Nectar Thick Nectar Thick Liquid: Within functional limits Presentation: Spoon;Straw;Self Fed;Cup   Honey Thick Honey Thick Liquid: Not tested   Puree Puree: Within functional limits Presentation: Spoon   Solid     Solid: Not tested     Thank you,  Havery MorosDabney Mariane Burpee, CCC-SLP [phone]   Jose Corvin 06/26/2018,4:00 PM

## 2018-06-26 NOTE — Care Management Important Message (Signed)
Important Message  Patient Details  Name: Tracy [name] MRN: [id number] Date of Birth: 16-Apr-1934   Medicare Important Message Given:       Renie OraHawkins, Laqueta Bonaventura Smith 06/26/2018, 10:42 AM

## 2018-06-26 NOTE — Evaluation (Signed)
Physical Therapy Evaluation Patient Details Name: Tracy [name] MRN: [id number] DOB: 1933-11-01 Today's Date: 06/26/2018   History of Present Illness  Tracy [name] is a 84 y.o. 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  Patient is accompanied by her caregiver who assists with the history.  She reportedly been in her usual state of health today, had just finished her lunch, when she was noted to be slumped over to her right, poorly responsive, and with right facial droop.  There was no fall or trauma reported prior to this and there was no seizure-like activity witnessed.  Patient returned to her usual state within approximately 15 minutes, but went on to have another very similar episode in the evening that prompted her presentation to the ED.  Patient does not recall the events, has no complaints in the emergency department, denies any recent headache, change in vision or hearing, or new focal numbness or weakness.  She denies any chest pain or palpitations.  No recent fevers or chills.  She denies any abdominal pain or diarrhea, and reports that her vomiting in the ED is secondary to motion sickness from the ambulance ride.    Clinical Impression  Patient functioning near baseline for functional mobility and gait; limited for standing due to poor balance and feet sliding forward during sit to stands, baseline requires 2 person assistance for transfers and ambulating with rollator, Max/total assist for 3-4 side steps at bedside holding on to RW with right hand.  Patient put back to bed and informed that if she wants to practice gait training in hospital to have her caregivers bring her Rollator and shoes to hospital - CM/SW notified.  Patient will benefit from continued physical  therapy in hospital and recommended venue below to increase strength, balance, endurance for safe ADLs and gait.    Follow Up Recommendations Supervision for mobility/OOB;Supervision/Assistance - 24 hour;Home health PT    Equipment Recommendations  None recommended by PT    Recommendations for Other Services       Precautions / Restrictions Precautions Precautions: Fall Precaution Comments: Corticobasal degeneration  Restrictions Weight Bearing Restrictions: No      Mobility  Bed Mobility Overal bed mobility: Needs Assistance Bed Mobility: Supine to Sit;Sit to Supine     Supine to sit: Mod assist;Max assist Sit to supine: Max assist   General bed mobility comments: most difficulty putting back to bed due to left sided weakness, able to use RUE to help pull self up to sitting  Transfers Overall transfer level: Needs assistance Equipment used: Rolling walker (2 wheeled) Transfers: Sit to/from [employer]Stand;Stand Pivot Transfers Sit to Stand: Max assist Stand pivot transfers: Max assist       General transfer comment: difficulty with sit to stands due to feet sliding forward, requires feet blocked to complete sit to stands  Ambulation/Gait Ambulation/Gait assistance: Max assist;Total assist Gait Distance (Feet): 2 Feet Assistive device: Rolling walker (2 wheeled) Gait Pattern/deviations: Decreased step length - right;Decreased step length - left;Decreased stride length Gait velocity: slow   General Gait Details: limited to 3-4 unsteady labored side steps using right hand to hold onto [employer]  Stairs            Wheelchair Mobility    Modified Rankin (Stroke Patients Only)       Balance Overall balance assessment: Needs  assistance Sitting-balance support: Feet supported;No upper extremity supported Sitting balance-Leahy Scale: Fair Sitting balance - Comments: fair using RUE to support self, fair/poor when unsupported   Standing balance support: During functional  activity;Single extremity supported Standing balance-Leahy Scale: Poor Standing balance comment: very unsteady with tendency for feet to slide forward                             Pertinent Vitals/Pain Pain Assessment: No/denies pain    Home Living Family/patient expects to be discharged to:: Group home                 Additional Comments: Patient reports she has been residing a group home for the past 6 months. She receives 24/7 care for all daily tasks.     Prior Function Level of Independence: Needs assistance   Gait / Transfers Assistance Needed: Patient reports she ambulates short distances(to bathroom and table) with her 4 wheeled walker and 2 person assistance from caregivers.   ADL's / Homemaking Assistance Needed: She receives total assistance for bathing, dressing, and self-feeding.         Hand Dominance   Dominant Hand: Right    Extremity/Trunk Assessment   Upper Extremity Assessment Upper Extremity Assessment: Defer to OT evaluation RUE Deficits / Details: Baseline chronic right side rigidity due to neurologic degenerative condition. Patient has functional grip strength although unable to use her RUE functionally for any task. reports she can hold onto her walker when ambulating.  LUE Deficits / Details: Left side chronic flaccidity with contractures noted in hand and elbow. Passively able to move wrist and shoulder for positioning. Pt reports she does not wear a hand brace for contracture management.     Lower Extremity Assessment Lower Extremity Assessment: Generalized weakness;RLE deficits/detail;LLE deficits/detail RLE Deficits / Details: grossly -4/5 LLE Deficits / Details: grossly 3+/5    Cervical / Trunk Assessment Cervical / Trunk Assessment: Normal  Communication   Communication: No difficulties  Cognition Arousal/Alertness: Awake/alert Behavior During Therapy: WFL for tasks assessed/performed Overall Cognitive Status: Within  Functional Limits for tasks assessed                                        General Comments      Exercises     Assessment/Plan    PT Assessment Patient needs continued PT services  PT Problem List Decreased strength;Decreased activity tolerance;Decreased balance;Decreased mobility       PT Treatment Interventions Gait training;Functional mobility training;Therapeutic activities;Patient/family education;Therapeutic exercise    PT Goals (Current goals can be found in the Care Plan section)  Acute Rehab PT Goals Patient Stated Goal: return to assisted care PT Goal Formulation: With patient Time For Goal Achievement: 07/01/18 Potential to Achieve Goals: Good    Frequency Min 4X/week   Barriers to discharge        Co-evaluation               AM-PAC PT "6 Clicks" Mobility  Outcome Measure Help needed turning from your back to your side while in a flat bed without using bedrails?: Total Help needed moving from lying on your back to sitting on the side of a flat bed without using bedrails?: Total Help needed moving to and from a bed to a chair (including a wheelchair)?: Total Help needed standing up from a chair using your  arms (e.g., wheelchair or bedside chair)?: A Lot Help needed to walk in hospital room?: Total Help needed climbing 3-5 steps with a railing? : Total 6 Click Score: 7    End of Session Equipment Utilized During Treatment: Oxygen Activity Tolerance: Patient tolerated treatment well;Patient limited by fatigue Patient left: in bed;with call bell/phone within reach Nurse Communication: Mobility status PT Visit Diagnosis: Unsteadiness on feet (R26.81);Other abnormalities of gait and mobility (R26.89);Muscle weakness (generalized) (M62.81)    Time: 1537-1610 PT Time Calculation (min) (ACUTE ONLY): 33 min   Charges:   PT Evaluation $PT Eval Moderate Complexity: 1 Mod PT Treatments $Therapeutic Activity: 23-37 mins        4:13  PM, 06/26/18 Ocie Bob, MPT Physical Therapist with [facility] [phone] office [phone] mobile phone

## 2018-06-26 NOTE — Evaluation (Signed)
Occupational Therapy Evaluation Patient Details Name: Tracy [name] MRN: [id number] DOB: February 25, 1934 Today's Date: 06/26/2018    History of Present Illness Tracy [name] is a 84 y.o. 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  Patient is accompanied by her caregiver who assists with the history.  She reportedly been in her usual state of health today, had just finished her lunch, when she was noted to be slumped over to her right, poorly responsive, and with right facial droop.  There was no fall or trauma reported prior to this and there was no seizure-like activity witnessed.  Patient returned to her usual state within approximately 15 minutes, but went on to have another very similar episode in the evening that prompted her presentation to the ED.  Patient does not recall the events, has no complaints in the emergency department, denies any recent headache, change in vision or hearing, or new focal numbness or weakness.  She denies any chest pain or palpitations.  No recent fevers or chills.  She denies any abdominal pain or diarrhea, and reports that her vomiting in the ED is secondary to motion sickness from the ambulance ride.   Clinical Impression   Pt agreeable to participate in OT evaluation. Patient reports that she does not walk unless it is to the bathroom or table with assistance and her walker. She is otherwise non-ambulatory. She receives total assistance from caregivers for all basic ADL tasks (Grooming, self-feeding, bathing, and dressing). Due to her corticobasal degeneration, she will continue to require total assistance for all basic daily tasks. She shows a left hand contracture which puts her at risk for skin breakdown. She states she does not wear a hand brace. A  towel roll was placed in her hand to help prevent skin breakdown. She would benefit from a resting hand brace for contracture management. No follow up OT services are needed unless to assess the appropriate hand brace once she returns home.     Follow Up Recommendations  No OT follow up    Equipment Recommendations  None recommended by OT       Precautions / Restrictions Precautions Precautions: Fall Precaution Comments: Non ambulatory. Corticobasal degeneration  Restrictions Weight Bearing Restrictions: No      Mobility    Transfers                 General transfer comment: Pt declined to transfer out of bed or sit on edge of bed during evaluation.         ADL either performed or assessed with clinical judgement   ADL Overall ADL's : At baseline       General ADL Comments: Total assist needed for all bathing, dressing, grooming, and self-feeding tasks.      Vision Baseline Vision/History: No visual deficits Patient Visual Report: No change from baseline              Pertinent Vitals/Pain Pain Assessment: No/denies pain     Hand Dominance Right   Extremity/Trunk Assessment Upper Extremity Assessment Upper Extremity Assessment: RUE deficits/detail;LUE deficits/detail RUE Deficits / Details: Baseline chronic right side rigidity due to neurologic degenerative condition. Patient has functional grip strength although unable to use her RUE functionally for any task. reports she can hold onto her walker when ambulating.  LUE Deficits / Details: Left side chronic  flaccidity with contractures noted in hand and elbow. Passively able to move wrist and shoulder for positioning. Pt reports she does not wear a hand brace for contracture management.    Lower Extremity Assessment Lower Extremity Assessment: Defer to PT evaluation       Communication Communication Communication: Other (comment)(Slow speech. low tone)   Cognition Arousal/Alertness:  Awake/alert Behavior During Therapy: WFL for tasks assessed/performed Overall Cognitive Status: Within Functional Limits for tasks assessed                                                Home Living Family/patient expects to be discharged to:: Group home                                 Additional Comments: Patient reports she has been residing a group home for the past 6 months. She receives 24/7 care for all daily tasks.       Prior Functioning/Environment Level of Independence: Needs assistance  Gait / Transfers Assistance Needed: Patient reports she ambulates short distances(to bathroom and table) with her 4 wheeled walker and assistance from caregivers.  ADL's / Homemaking Assistance Needed: She receives total assistance for bathing, dressing, and self-feeding.                                      AM-PAC OT "6 Clicks" Daily Activity     Outcome Measure Help from another person eating meals?: Total Help from another person taking care of personal grooming?: Total Help from another person toileting, which includes using toliet, bedpan, or urinal?: Total Help from another person bathing (including washing, rinsing, drying)?: Total Help from another person to put on and taking off regular upper body clothing?: Total Help from another person to put on and taking off regular lower body clothing?: Total 6 Click Score: 6   End of Session Equipment Utilized During Treatment: Oxygen Nurse Communication: Other (comment)(Nurse aware of rolled washcloth placed in left hand for contracture management. )  Activity Tolerance: Patient tolerated treatment well Patient left: in bed;with call bell/phone within reach  OT Visit Diagnosis: Muscle weakness (generalized) (M62.81)                Time: 8119-14781330-1347 OT Time Calculation (min): 17 min Charges:  OT General Charges $OT Visit: 1 Visit OT Evaluation $OT Eval Low Complexity: 1 Low  Tracy PatriciaLaura  Alyaan [name], OTR/L,CBIS  [phone]   Tracy [name], Tracy [name] 06/26/2018, 1:59 PM

## 2018-06-26 NOTE — Care Management Obs Status (Signed)
MEDICARE OBSERVATION STATUS NOTIFICATION   Patient Details  Name: Brion Alimentancy P Boyden MRN: [id number] Date of Birth: September 13, 1933   Medicare Observation Status Notification Given:  Yes    Renie OraHawkins, Kay Shippy Smith 06/26/2018, 10:43 AM

## 2018-06-26 NOTE — Progress Notes (Signed)
*  PRELIMINARY RESULTS* Echocardiogram 2D Echocardiogram has been performed.  Tracy [name], Tracy [name] 06/26/2018, 11:15 AM

## 2018-06-26 NOTE — Progress Notes (Signed)
Patient ID: Tracy [name], female   DOB: [birth year], 84 y.o.   MRN: [id number]  PROGRESS NOTE    NIKITTA SOBIECH  JXB:[id number] DOB: [birth year] DOA: 06/25/2018 PCP: Benita Stabile, MD   Brief Narrative:   Tracy [name] is a 84 y.o. 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  Patient is accompanied by her caregiver who assists with the history.  She reportedly been in her usual state of health today, had just finished her lunch, when she was noted to be slumped over to her right, poorly responsive, and with right facial droop.  There was no fall or trauma reported prior to this and there was no seizure-like activity witnessed.  Patient returned to her usual state within approximately 15 minutes, but went on to have another very similar episode in the evening that prompted her presentation to the ED.  Patient does not recall the events, has no complaints in the emergency department, denies any recent headache, change in vision or hearing, or new focal numbness or weakness.  She denies any chest pain or palpitations.  No recent fevers or chills.  She denies any abdominal pain or diarrhea, and reports that her vomiting in the ED is secondary to motion sickness from the ambulance ride.   Assessment & Plan:   Principal Problem:   Transient neurologic deficit Active Problems:   Diabetes mellitus (HCC)   Essential hypertension, benign   Corticobasal degeneration (HCC)   Nausea & vomiting  1. Transient neurologic symptoms  - Presents for evaluation of 2 episodes, ~15 minutes each, marked by poor responsiveness and right facial droop, no seizure-like activity  - The patient has no complaints and seems to have returned to baseline by time of admission  - No acute findings on head CT  -  Continue cardiac monitoring with frequent neuro checks  - Check EKG, MRI brain, carotid US, echocardiogram, fasting lipids, and A1c  - Start ASA, NPO pending swallow screen, PT/OT/SLP consults   2. 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 Nausea and vomiting  - Abdominal exam is benign  - Patient reports this is secondary to motion sickness from ambulance ride  - Continue supportive care, monitor lytes    4. Hypertension  - Hold antihypertensives initially while evaluating for possible acute ischemic CVA   5. Type II DM  - No A1c on file - Has been diet-controlled recently, no longer on metformin  - Check CBG's and use a low-intensity SSI with Novolog as needed for now    6.  Benign positional vertigo Increase meclizine    DVT prophylaxis: Lovenox Code Status: Full  family Communication: Caregiver Disposition Plan: Tomorrow  Procedures:  Carotid dopplers IMPRESSION: 1. Minimal to moderate amount of bilateral atherosclerotic plaque, left greater than right, not resulting in a hemodynamically significant stenosis within either internal carotid artery. 2. Non visualization of a patent right vertebral artery, potentially technique related due to patient limited mobility and of uncertain clinical significance as a dominant and/or solitary left vertebral artery is a relatively common asymptomatic congenital variant.   Electronically Signed   By: Simonne Come M.D.   On: 06/26/2018 10:20     Subjective: Not back to baseline per caregiver,  none ambulatory at baseline patient reports vertiginous symptoms that she fights constantly with her vertigo.  Objective: Vitals:   06/26/18 0508 06/26/18 0708 06/26/18 0915 06/26/18 0918  BP: (!) 175/68 (!) 156/63 (!) 140/51   Pulse: 98 93 85   Resp: 18 18 19    Temp: 98.5 F (36.9 C) 99 F (37.2 C) 98.1 F  (36.7 C) 98.6 F (37 C)  TempSrc: Oral Oral Oral Axillary  SpO2: (!) 78% 100% 99%   Weight:      Height:        Intake/Output Summary (Last 24 hours) at 06/26/2018 1027 Last data filed at 06/26/2018 0900 Gross per 24 hour  Intake 1261.06 ml  Output -  Net 1261.06 ml   Filed Weights   06/25/18 2315  Weight: 56.8 kg    Examination:  General exam: Appears calm and comfortable  Respiratory system: Clear to auscultation. Respiratory effort normal. Cardiovascular system: S1 & S2 heard, RRR. No JVD, murmurs, rubs, gallops or clicks. No pedal edema. Gastrointestinal system: Abdomen is nondistended, soft and nontender. No organomegaly or masses felt. Normal bowel sounds heard. Central nervous system: Alert and oriented. No focal neurological deficits. Extremities: Symmetric 5 x 5 power. Skin: No rashes, lesions or ulcers Psychiatry: Not agitated    Data Reviewed: I have personally reviewed following labs and imaging studies  CBC: Recent Labs  Lab 06/25/18 2020  WBC 13.2*  NEUTROABS 10.0*  HGB 12.0  HCT 36.4  MCV 92.6  PLT 485*   Basic Metabolic Panel: Recent Labs  Lab 06/25/18 2020  NA 139  K 3.4*  CL 103  CO2 25  GLUCOSE 176*  BUN 16  CREATININE 0.76  CALCIUM 6.7*   GFR: Estimated Creatinine Clearance: 48.6 mL/min (by C-G formula based on SCr of 0.76 mg/dL). Liver Function Tests: Recent Labs  Lab 06/25/18 2020  AST 20  ALT 10  ALKPHOS 37*  BILITOT 0.5  PROT 6.8  ALBUMIN 3.8   Recent Labs  Lab 06/25/18 2020  LIPASE 36   No results for input(s): AMMONIA in the last 168 hours. Coagulation Profile: Recent Labs  Lab 06/25/18 2021  INR 0.92   Cardiac Enzymes: Recent Labs  Lab 06/25/18 2020  TROPONINI <0.03   BNP (last 3 results) No results for input(s): PROBNP in the last 8760 hours. HbA1C: No results for input(s): HGBA1C in the last 72 hours. CBG: Recent Labs  Lab 06/26/18 0024 06/26/18 0455 06/26/18 0734  GLUCAP 170* 102* 113*     Lipid Profile: Recent Labs    06/26/18 0428  CHOL 127  HDL 42  LDLCALC 74  TRIG 55  CHOLHDL 3.0   Thyroid Function Tests: No results for input(s): TSH, T4TOTAL, FREET4, T3FREE, THYROIDAB in the last 72 hours. Anemia Panel: No results for input(s): VITAMINB12, FOLATE, FERRITIN, TIBC, IRON, RETICCTPCT in the last 72 hours. Sepsis Labs: No results for input(s): PROCALCITON, LATICACIDVEN in the last 168 hours.  No results found for this or any previous visit (from the past 240 hour(s)).       Radiology Studies: Mr Brain Wo Contrast  Result Date: 06/26/2018 CLINICAL DATA:  Altered mental status. EXAM: MRI HEAD WITHOUT CONTRAST TECHNIQUE: Multiplanar, multiecho pulse sequences of the brain and surrounding structures were obtained without intravenous contrast. COMPARISON:  Head CT 06/25/2018 and MRI 03/27/2014 FINDINGS: The study is mildly to moderately motion degraded throughout. Brain: There is no evidence of acute infarct, intracranial hemorrhage, mass, midline shift, or extra-axial fluid collection. Generalized cerebral atrophy is mild  for age. Patchy cerebral white matter T2 hyperintensities are nonspecific but compatible with mild-to-moderate chronic small vessel ischemic disease, likely mildly progressed from 2015. Vascular: Major intracranial vascular flow voids are preserved. Skull and upper cervical spine: No suspicious marrow lesion. Sinuses/Orbits: Unremarkable orbits. Clear paranasal sinuses. Trace bilateral mastoid effusions. Other: None. IMPRESSION: 1. Motion degraded examination without evidence of acute intracranial abnormality. 2. Mild-to-moderate chronic small vessel ischemic disease. Electronically Signed   By: Sebastian AcheAllen  Grady M.D.   On: 06/26/2018 08:46   [place] Carotid Bilateral  Result Date: 06/26/2018 CLINICAL DATA:  Transient neurologic deficit. History of hypertension and diabetes. EXAM: BILATERAL CAROTID DUPLEX ULTRASOUND TECHNIQUE: Wallace CullensGray scale imaging, color Doppler  and duplex ultrasound were performed of bilateral carotid and vertebral arteries in the neck. COMPARISON:  None. FINDINGS: Criteria: Quantification of carotid stenosis is based on velocity parameters that correlate the residual internal carotid diameter with NASCET-based stenosis levels, using the diameter of the distal internal carotid lumen as the denominator for stenosis measurement. The following velocity measurements were obtained: RIGHT ICA: 105/21 cm/sec CCA: 126/15 cm/sec SYSTOLIC ICA/CCA RATIO:  0.8 ECA: 143 cm/sec LEFT ICA: 103/21 cm/sec CCA: 106/16 cm/sec SYSTOLIC ICA/CCA RATIO:  1.0 ECA: 150 cm/sec RIGHT CAROTID ARTERY: There is a minimal amount of eccentric mixed echogenic plaque within the right carotid bulb (image 19), extending to involve the origin and proximal aspects of the right internal carotid artery (image 31, not resulting in elevated peak systolic velocities within the interrogated course the right internal carotid artery to suggest a hemodynamically significant stenosis. RIGHT VERTEBRAL ARTERY:  Not visualized LEFT CAROTID ARTERY: There is a moderate amount of eccentric echogenic partially shadowing plaque within the left carotid bulb (image 56), extending to involve the origin and proximal aspect the left internal carotid artery (image 65, not resulting in elevated peak systolic velocities within the interrogated course the left internal carotid artery to suggest a hemodynamically significant stenosis. LEFT VERTEBRAL ARTERY:  Antegrade Flow IMPRESSION: 1. Minimal to moderate amount of bilateral atherosclerotic plaque, left greater than right, not resulting in a hemodynamically significant stenosis within either internal carotid artery. 2. Non visualization of a patent right vertebral artery, potentially technique related due to patient limited mobility and of uncertain clinical significance as a dominant and/or solitary left vertebral artery is a relatively common asymptomatic congenital  variant. Electronically Signed   By: Simonne ComeJohn  Watts M.D.   On: 06/26/2018 10:20   Dg Chest Port 1 View  Result Date: 06/25/2018 CLINICAL DATA:  Acute onset of respiratory failure. Hypoxia. Altered mental status. EXAM: PORTABLE CHEST 1 VIEW COMPARISON:  Chest radiograph performed 08/13/2016 FINDINGS: The lungs are well-aerated. Minimal right basilar airspace opacity could reflect mild pneumonia. There is no evidence of pleural effusion or pneumothorax. The cardiomediastinal silhouette is mildly enlarged. No acute osseous abnormalities are seen. IMPRESSION: Minimal right basilar airspace opacity could reflect mild pneumonia. Mild cardiomegaly noted. Electronically Signed   By: Roanna RaiderJeffery  Chang M.D.   On: 06/25/2018 22:18   Ct Head Code Stroke Wo Contrast  Result Date: 06/25/2018 CLINICAL DATA:  Code stroke. Altered mental status. Transient facial droop earlier today. Vomiting. EXAM: CT HEAD WITHOUT CONTRAST TECHNIQUE: Contiguous axial images were obtained from the base of the skull through the vertex without intravenous contrast. COMPARISON:  06/02/2014 head CT and 03/27/2014 MRI FINDINGS: Brain: There is no evidence of acute infarct, intracranial hemorrhage, mass, midline shift, or extra-axial fluid collection. There is mild cerebral atrophy. Cerebral white matter hypodensities are stable to slightly progressive and nonspecific  but compatible with mild-to-moderate chronic small vessel ischemic disease. Vascular: No hyperdense vessel. Skull: No fracture or focal osseous lesion allowing for motion artifact through the skull base. Sinuses/Orbits: Visualized paranasal sinuses and mastoid air cells are grossly clear. Orbits are grossly unremarkable. Other: None. ASPECTS [facility] Stroke Program Early CT Score) - Ganglionic level infarction (caudate, lentiform nuclei, internal capsule, insula, M1-M3 cortex): 7 - Supraganglionic infarction (M4-M6 cortex): 3 Total score (0-10 with 10 being normal): 10 IMPRESSION: 1. No  evidence of acute intracranial abnormality. 2. ASPECTS is 10. 3. Mild-to-moderate chronic small vessel ischemic disease. These results were called by telephone at the time of interpretation on 06/25/2018 at 8:29 pm to Dr. Meridee Score , who verbally acknowledged these results. Electronically Signed   By: Sebastian Ache M.D.   On: 06/25/2018 20:29        Scheduled Meds: . aspirin  300 mg Rectal Daily   Or  . aspirin  325 mg Oral Daily  . Carbidopa-Levodopa ER  1 tablet Oral TID  . darifenacin  7.5 mg Oral QHS  . diazepam  1 mg Oral Daily  . enoxaparin (LOVENOX) injection  40 mg Subcutaneous Q24H  . insulin aspart  0-9 Units Subcutaneous Q4H   Continuous Infusions:   LOS: 0 days    Time spent: 25 minutes    Christropher Gintz A, MD [facility] Pager [phone]  If 7PM-7AM, please contact night-coverage [email or web address] Password [email or web address] 06/26/2018, 10:27 AM

## 2018-06-26 NOTE — Progress Notes (Signed)
Pt transported down to radiology via transport staff for testing.

## 2018-06-26 NOTE — Progress Notes (Signed)
SLP Cancellation Note  Patient Details Name: Tracy [name] MRN: [id number] DOB: 04-Aug-1933   Cancelled treatment:        Attempted to complete ST eval this am; Pt was out of the room for a procedure. RN stated Pt was NPO because she failed the stroke swallow; Communicated with RN that SLE was ordered not BSE. Requested RN complete [facility] Stroke Swallow Screen and have MD order BSE if indicated. Acknowledge SLE order is now discontinued and BSE is ordered. ST to complete Clinical swallowing evaluation later today. Thank you,  Amelia H. Romie LeveeYarbrough MA, CCC-SLP Speech Language Pathologist    [name] 06/26/2018, 12:37 PM

## 2018-06-26 NOTE — Plan of Care (Signed)
  Problem: Acute Rehab PT Goals(only PT should resolve) Goal: Pt Will Go Supine/Side To Sit Outcome: Progressing Flowsheets (Taken 06/26/2018 1614) Pt will go Supine/Side to Sit: with moderate assist; with maximum assist Goal: Patient Will Transfer Sit To/From Stand Outcome: Progressing Flowsheets (Taken 06/26/2018 1614) Patient will transfer sit to/from stand: with moderate assist; with maximum assist Goal: Pt Will Transfer Bed To Chair/Chair To Bed Outcome: Progressing Flowsheets (Taken 06/26/2018 1614) Pt will Transfer Bed to Chair/Chair to Bed: with mod assist; with max assist Goal: Pt Will Ambulate Outcome: Progressing Flowsheets (Taken 06/26/2018 1614) Pt will Ambulate: with +2; with maximum assist x 50 feet Note:  Rollator walker  4:16 PM, 06/26/18 Ocie BobJames Samiksha Pellicano, MPT Physical Therapist with [facility] [phone] office [phone] mobile phone

## 2018-06-27 ENCOUNTER — Observation Stay (HOSPITAL_COMMUNITY)
Admit: 2018-06-27 | Discharge: 2018-06-27 | Disposition: A | Discharge status: Home / Self Care | Payer: Medicare Other | Attending: Family Medicine | Admitting: Family Medicine

## 2018-06-27 DIAGNOSIS — R29818 Other symptoms and signs involving the nervous system: Secondary | ICD-10-CM | POA: Diagnosis not present

## 2018-06-27 LAB — GLUCOSE, CAPILLARY
Glucose-Capillary: 111 mg/dL — ABNORMAL HIGH (ref 70–99)
Glucose-Capillary: 148 mg/dL — ABNORMAL HIGH (ref 70–99)
Glucose-Capillary: 171 mg/dL — ABNORMAL HIGH (ref 70–99)
Glucose-Capillary: 228 mg/dL — ABNORMAL HIGH (ref 70–99)
Glucose-Capillary: 96 mg/dL (ref 70–99)
Glucose-Capillary: 99 mg/dL (ref 70–99)

## 2018-06-27 NOTE — Progress Notes (Signed)
EEG completed; results pending.    

## 2018-06-27 NOTE — Progress Notes (Signed)
  Speech Language Pathology Treatment: Dysphagia  Patient Details Name: Tracy [name] MRN: [id number] DOB: 10-Jan-1934 Today's Date: 06/27/2018 Time: [id number] SLP Time Calculation (min) (ACUTE ONLY): 22 min  Assessment / Plan / Recommendation Clinical Impression  Pt observed with lunch meal of puree and NTL. RN reports that Pt was coughing when medications were presented in Jello (floor did not have applesauce). SLP recommended only presenting pills whole in puree or Magic Cup and provided an applesauce to keep for medications. Pt was able to self feed, but required tactile cues for rate and bite size and verbal cues to purse lips around spoon, swallow hard, swallow 2x for each bite/sip, and to avoid talking with food in her mouth. Pt does continue to present with an occasional cough, however vocal quality clear and Pt with h/o coughing with po intake. Continue diet as ordered and will likely pursue MBSS prior to discharge to objectively evaluate as last one was in 2017. SLP to follow.   HPI HPI: Tracy [name] is a 84 y.o. 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  Patient is accompanied by her caregiver who assists with the history.  She reportedly been in her usual state of health today, had just finished her lunch, when she was noted to be slumped over to her right, poorly responsive, and with right facial droop.  There was no fall or trauma reported prior to this and there was no seizure-like activity witnessed.  Patient returned to her usual state within approximately 15 minutes, but went on to have another very similar episode in the evening that prompted her presentation to the ED.  Patient does not recall the events, has no complaints in the emergency department, denies any  recent headache, change in vision or hearing, or new focal numbness or weakness.  She denies any chest pain or palpitations.  No recent fevers or chills.  She denies any abdominal pain or diarrhea, and reports that her vomiting in the ED is secondary to motion sickness from the ambulance ride. BSE requested due to Pt failing [facility] screen. The Pt had MBSS in February 2017 with recommendation for D3/thin.      SLP Plan  Continue with current plan of care       Recommendations  Diet recommendations: Dysphagia 1 (puree);Nectar-thick liquid Liquids provided via: Cup;Teaspoon Medication Administration: Whole meds with puree Supervision: Patient able to self feed;Full supervision/cueing for compensatory strategies Compensations: Slow rate;Small sips/bites Postural Changes and/or Swallow Maneuvers: Seated upright 90 degrees;Upright 30-60 min after meal                Oral Care Recommendations: Oral care BID;Staff/trained caregiver to provide oral care Follow up Recommendations: 24 hour supervision/assistance SLP Visit Diagnosis: Dysphagia, oropharyngeal phase (R13.12) Plan: Continue with current plan of care       Thank you,  Havery MorosDabney Maleaha Hughett, CCC-SLP [phone]                 Wolfgang Finigan 06/27/2018, 4:51 PM

## 2018-06-27 NOTE — Procedures (Signed)
History: 84 year old female being evaluated for transient neurological symptoms  Sedation: None  Technique: This is a 21 channel routine scalp EEG performed at the bedside with bipolar and monopolar montages arranged in accordance to the international 10/20 system of electrode placement. One channel was dedicated to EKG recording.    Background: The background consists of intermixed alpha and beta activities. There is a well defined posterior dominant rhythm of 9-10 hz that attenuates with eye opening. Sleep is not recorded.  Photic stimulation: Physiologic driving is not performed  EEG Abnormalities: None  Clinical Interpretation: This normal EEG is recorded in the waking state. There was no seizure or seizure predisposition recorded on this study. Please note that lack of epileptiform activity on EEG does not preclude the possibility of epilepsy.   Ritta SlotMcNeill Kaileah Shevchenko, MD [facility] [phone]  If 7pm- 7am, please page neurology on call as listed in AMION.

## 2018-06-27 NOTE — Progress Notes (Signed)
Patient ID: Tracy [name], female   DOB: [birth year], 84 y.o.   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 Patient is accompanied by her caregiver who assists with the history. She reportedly been in her usual state of health today, had just finished her lunch, when she was noted to be slumped over to her right, poorly responsive, and with right facial droop. There was no fall or trauma reported prior to this and there was no seizure-like activity witnessed. Patient returned to her usual state within approximately 15 minutes, but went on to have another very similar episode in the evening that prompted her presentation to the ED. Patient does not recall the events, has no complaints in the emergency department, denies any recent headache, change in vision or hearing, or new focal numbness or weakness. She denies any chest pain or palpitations. No recent fevers or chills. She denies any abdominal pain or diarrhea, and reports that her vomiting in the ED is secondary to motion sickness from the ambulance ride.   Assessment & Plan:   Principal Problem:   Transient neurologic deficit Active Problems:   Diabetes mellitus (HCC)   Essential hypertension, benign   Corticobasal degeneration (HCC)   Nausea & vomiting  1.Transient neurologic symptoms -Presents for evaluation of 2 episodes, ~15 minutes each, marked by poor responsiveness and right facial droop, no seizure-like activity -The patient has no complaints and seems to have returned to baseline by time of admissionbut now worse again -No acute  findings on head CT -Continue cardiac monitoring with frequent neuro checks -w/u neg thus far neg mri, echo , dopplers -Start ASA, NPO pending swallow screen, PT/OT/SLP consults -  EEG - neuro consult never completed, dr Gerilyn Pilgrim on [facility] Monday but may be incorrect  2.Corticobasal degeneration -Follows with neurology for 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  Benign positional vertigo Increase meclizine, improved   DVT prophylaxis: lovenox Code Status: full Family Communication: none Disposition Plan: pending clinical improvement and EEG   Subjective: Still confused and not back to baseline  Objective: Vitals:   06/26/18 2218 06/26/18 2325 06/27/18 0337 06/27/18 0700  BP:  (!) 162/70 (!) 150/67 (!) 162/72  Pulse: 81 82 71 78  Resp: 16     Temp:  97.7 F (36.5 C) 98.4 F (36.9 C) 98.6 F (37 C)  TempSrc:  Oral Oral Oral  SpO2: 97% 97% 99% 97%  Weight:      Height:        Intake/Output Summary (Last 24 hours) at 06/27/2018 1026 Last data filed at 06/27/2018 0935 Gross per 24 hour  Intake 240 ml  Output -  Net 240 ml   Filed Weights   06/25/18 2315  Weight: 56.8 kg  Examination:  General exam: Appears calm and comfortable but confused and overall weak Respiratory system: Clear to auscultation. Respiratory effort normal. Cardiovascular system: S1 & S2 heard, RRR. No JVD, murmurs, rubs, gallops or clicks. No pedal  edema. Gastrointestinal system: Abdomen is nondistended, soft and nontender. No organomegaly or masses felt. Normal bowel sounds heard. Central nervous system: Alert and oriented. No focal neurological deficits. Extremities: Symmetric 5 x 5 power. Skin: No rashes, lesions or ulcers Psychiatry: not agitated    Data Reviewed: I have personally reviewed following labs and imaging studies  CBC: Recent Labs  Lab 06/25/18 2020  WBC 13.2*  NEUTROABS 10.0*  HGB 12.0  HCT 36.4  MCV 92.6  PLT 485*   Basic Metabolic Panel: Recent Labs  Lab 06/25/18 2020  NA 139  K 3.4*  CL 103  CO2 25  GLUCOSE 176*  BUN 16  CREATININE 0.76  CALCIUM 6.7*   GFR: Estimated Creatinine Clearance: 48.6 mL/min (by C-G formula based on SCr of 0.76 mg/dL). Liver Function Tests: Recent Labs  Lab 06/25/18 2020  AST 20  ALT 10  ALKPHOS 37*  BILITOT 0.5  PROT 6.8  ALBUMIN 3.8   Recent Labs  Lab 06/25/18 2020  LIPASE 36   No results for input(s): AMMONIA in the last 168 hours. Coagulation Profile: Recent Labs  Lab 06/25/18 2021  INR 0.92   Cardiac Enzymes: Recent Labs  Lab 06/25/18 2020  TROPONINI <0.03   BNP (last 3 results) No results for input(s): PROBNP in the last 8760 hours. HbA1C: Recent Labs    06/26/18 0428  HGBA1C 5.3   CBG: Recent Labs  Lab 06/26/18 1621 06/26/18 2007 06/27/18 0014 06/27/18 0356 06/27/18 0725  GLUCAP 164* 109* 111* 99 96   Lipid Profile: Recent Labs    06/26/18 0428  CHOL 127  HDL 42  LDLCALC 74  TRIG 55  CHOLHDL 3.0   Thyroid Function Tests: No results for input(s): TSH, T4TOTAL, FREET4, T3FREE, THYROIDAB in the last 72 hours. Anemia Panel: No results for input(s): VITAMINB12, FOLATE, FERRITIN, TIBC, IRON, RETICCTPCT in the last 72 hours. Sepsis Labs: No results for input(s): PROCALCITON, LATICACIDVEN in the last 168 hours.  Recent Results (from the past 240 hour(s))  MRSA PCR Screening     Status: None   Collection Time:  06/26/18 12:40 AM  Result Value Ref Range Status   MRSA by PCR NEGATIVE NEGATIVE Final    Comment:        The GeneXpert MRSA Assay (FDA approved for NASAL specimens only), is one component of a comprehensive MRSA colonization surveillance program. It is not intended to diagnose MRSA infection nor to guide or monitor treatment for MRSA infections. Performed at [facility], [address] [place], [place] [address]       Radiology Studies: Mr Brain Wo Contrast  Result Date: 06/26/2018 CLINICAL DATA:  Altered mental status. EXAM: MRI HEAD WITHOUT CONTRAST TECHNIQUE: Multiplanar, multiecho pulse sequences of the brain and surrounding structures were obtained without intravenous contrast. COMPARISON:  Head CT 06/25/2018 and MRI 03/27/2014 FINDINGS: The study is mildly to moderately motion degraded throughout. Brain: There is no evidence of acute infarct, intracranial hemorrhage, mass, midline shift, or extra-axial fluid collection. Generalized cerebral atrophy is mild for age. Patchy cerebral white matter T2 hyperintensities are nonspecific but compatible with mild-to-moderate chronic small vessel ischemic disease, likely mildly progressed from 2015. Vascular: Major intracranial vascular flow voids are preserved. Skull and upper cervical spine: No suspicious marrow lesion. Sinuses/Orbits: Unremarkable orbits. Clear  paranasal sinuses. Trace bilateral mastoid effusions. Other: None. IMPRESSION: 1. Motion degraded examination without evidence of acute intracranial abnormality. 2. Mild-to-moderate chronic small vessel ischemic disease. Electronically Signed   By: Sebastian AcheAllen  Grady M.D.   On: 06/26/2018 08:46   [place] Carotid Bilateral  Result Date: 06/26/2018 CLINICAL DATA:  Transient neurologic deficit. History of hypertension and diabetes. EXAM: BILATERAL CAROTID DUPLEX ULTRASOUND TECHNIQUE: Wallace CullensGray scale imaging, color Doppler and duplex ultrasound were performed of bilateral carotid and vertebral arteries  in the neck. COMPARISON:  None. FINDINGS: Criteria: Quantification of carotid stenosis is based on velocity parameters that correlate the residual internal carotid diameter with NASCET-based stenosis levels, using the diameter of the distal internal carotid lumen as the denominator for stenosis measurement. The following velocity measurements were obtained: RIGHT ICA: 105/21 cm/sec CCA: 126/15 cm/sec SYSTOLIC ICA/CCA RATIO:  0.8 ECA: 143 cm/sec LEFT ICA: 103/21 cm/sec CCA: 106/16 cm/sec SYSTOLIC ICA/CCA RATIO:  1.0 ECA: 150 cm/sec RIGHT CAROTID ARTERY: There is a minimal amount of eccentric mixed echogenic plaque within the right carotid bulb (image 19), extending to involve the origin and proximal aspects of the right internal carotid artery (image 31, not resulting in elevated peak systolic velocities within the interrogated course the right internal carotid artery to suggest a hemodynamically significant stenosis. RIGHT VERTEBRAL ARTERY:  Not visualized LEFT CAROTID ARTERY: There is a moderate amount of eccentric echogenic partially shadowing plaque within the left carotid bulb (image 56), extending to involve the origin and proximal aspect the left internal carotid artery (image 65, not resulting in elevated peak systolic velocities within the interrogated course the left internal carotid artery to suggest a hemodynamically significant stenosis. LEFT VERTEBRAL ARTERY:  Antegrade Flow IMPRESSION: 1. Minimal to moderate amount of bilateral atherosclerotic plaque, left greater than right, not resulting in a hemodynamically significant stenosis within either internal carotid artery. 2. Non visualization of a patent right vertebral artery, potentially technique related due to patient limited mobility and of uncertain clinical significance as a dominant and/or solitary left vertebral artery is a relatively common asymptomatic congenital variant. Electronically Signed   By: Simonne ComeJohn  Watts M.D.   On: 06/26/2018 10:20    Dg Chest Port 1 View  Result Date: 06/25/2018 CLINICAL DATA:  Acute onset of respiratory failure. Hypoxia. Altered mental status. EXAM: PORTABLE CHEST 1 VIEW COMPARISON:  Chest radiograph performed 08/13/2016 FINDINGS: The lungs are well-aerated. Minimal right basilar airspace opacity could reflect mild pneumonia. There is no evidence of pleural effusion or pneumothorax. The cardiomediastinal silhouette is mildly enlarged. No acute osseous abnormalities are seen. IMPRESSION: Minimal right basilar airspace opacity could reflect mild pneumonia. Mild cardiomegaly noted. Electronically Signed   By: Roanna RaiderJeffery  Chang M.D.   On: 06/25/2018 22:18   Ct Head Code Stroke Wo Contrast  Result Date: 06/25/2018 CLINICAL DATA:  Code stroke. Altered mental status. Transient facial droop earlier today. Vomiting. EXAM: CT HEAD WITHOUT CONTRAST TECHNIQUE: Contiguous axial images were obtained from the base of the skull through the vertex without intravenous contrast. COMPARISON:  06/02/2014 head CT and 03/27/2014 MRI FINDINGS: Brain: There is no evidence of acute infarct, intracranial hemorrhage, mass, midline shift, or extra-axial fluid collection. There is mild cerebral atrophy. Cerebral white matter hypodensities are stable to slightly progressive and nonspecific but compatible with mild-to-moderate chronic small vessel ischemic disease. Vascular: No hyperdense vessel. Skull: No fracture or focal osseous lesion allowing for motion artifact through the skull base. Sinuses/Orbits: Visualized paranasal sinuses and mastoid air cells are grossly clear. Orbits are grossly unremarkable. Other: None.  ASPECTS [facility] Stroke Program Early CT Score) - Ganglionic level infarction (caudate, lentiform nuclei, internal capsule, insula, M1-M3 cortex): 7 - Supraganglionic infarction (M4-M6 cortex): 3 Total score (0-10 with 10 being normal): 10 IMPRESSION: 1. No evidence of acute intracranial abnormality. 2. ASPECTS is 10. 3.  Mild-to-moderate chronic small vessel ischemic disease. These results were called by telephone at the time of interpretation on 06/25/2018 at 8:29 pm to Dr. Meridee Score , who verbally acknowledged these results. Electronically Signed   By: Sebastian Ache M.D.   On: 06/25/2018 20:29     Scheduled Meds: . aspirin  300 mg Rectal Daily   Or  . aspirin  325 mg Oral Daily  . Carbidopa-Levodopa ER  1 tablet Oral TID  . darifenacin  7.5 mg Oral QHS  . diazepam  1 mg Oral Daily  . enoxaparin (LOVENOX) injection  40 mg Subcutaneous Q24H  . insulin aspart  0-9 Units Subcutaneous Q4H  . meclizine  25 mg Oral BID   Continuous Infusions:   LOS: 0 days    Time spent: 35 min    Arleatha Philipps A, MD [facility] Pager [phone]  If 7PM-7AM, please contact night-coverage [email or web address] Password [email or web address] 06/27/2018, 10:26 AM

## 2018-06-27 NOTE — Progress Notes (Signed)
Physical Therapy Treatment Patient Details Name: Tracy [name] MRN: [id number] DOB: [birth year] Today's Date: 06/27/2018    History of Present Illness Tracy Alimentancy P Rickey is a 84 y.o. 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  Patient is accompanied by her caregiver who assists with the history.  She reportedly been in her usual state of health today, had just finished her lunch, when she was noted to be slumped over to her right, poorly responsive, and with right facial droop.  There was no fall or trauma reported prior to this and there was no seizure-like activity witnessed.  Patient returned to her usual state within approximately 15 minutes, but went on to have another very similar episode in the evening that prompted her presentation to the ED.  Patient does not recall the events, has no complaints in the emergency department, denies any recent headache, change in vision or hearing, or new focal numbness or weakness.  She denies any chest pain or palpitations.  No recent fevers or chills.  She denies any abdominal pain or diarrhea, and reports that her vomiting in the ED is secondary to motion sickness from the ambulance ride.    PT Comments    Patient states she feels better and had no c/o pain, able to take a few steps holding onto RW with right hand, limited to bedside due to poor standing balance and inability to use LUE.  Patient tolerated sitting up in chair after therapy - nursing staff notified.  Patient will benefit from continued physical therapy in hospital to increase strength, balance, endurance for safe ADLs and gait.   Follow Up Recommendations  Supervision for mobility/OOB;Supervision/Assistance - 24 hour;Home health PT     Equipment Recommendations  None  recommended by PT    Recommendations for Other Services       Precautions / Restrictions Precautions Precautions: Fall Precaution Comments: Corticobasal degeneration  Restrictions Weight Bearing Restrictions: No    Mobility  Bed Mobility Overal bed mobility: Needs Assistance Bed Mobility: Supine to Sit     Supine to sit: Mod assist;Max assist     General bed mobility comments: difficulty propping up on right elbow for supine to sitting  Transfers Overall transfer level: Needs assistance Equipment used: Rolling walker (2 wheeled) Transfers: Sit to/from [employer]Stand;Stand Pivot Transfers Sit to Stand: Mod assist;Max assist Stand pivot transfers: Mod assist;Max assist       General transfer comment: able to grip RW with right hand  Ambulation/Gait Ambulation/Gait assistance: Max assist Gait Distance (Feet): 3 Feet Assistive device: Rolling walker (2 wheeled) Gait Pattern/deviations: Decreased step length - right;Decreased step length - left;Decreased stride length;Decreased stance time - left Gait velocity: slow   General Gait Details: limited to 5-6 unsteady short steps hold onto RW with right hand   Stairs             Wheelchair Mobility    Modified Rankin (Stroke Patients Only)       Balance Overall balance assessment: Needs assistance Sitting-balance support: Feet supported;Single extremity supported Sitting balance-Leahy Scale: Fair Sitting balance - Comments: poor when not supporting self with RUE   Standing balance support: During functional activity;Single extremity supported Standing balance-Leahy Scale: Poor Standing balance comment: unsteady with leaning to the left  Cognition Arousal/Alertness: Awake/alert Behavior During Therapy: WFL for tasks assessed/performed Overall Cognitive Status: Within Functional Limits for tasks assessed                                        Exercises  General Exercises - Lower Extremity Ankle Circles/Pumps: Seated;AROM;Both;10 reps Long Arc Quad: Seated;AROM;Strengthening;Both;10 reps Hip Flexion/Marching: Seated;AROM;Strengthening;Both;10 reps    General Comments        Pertinent Vitals/Pain Pain Assessment: No/denies pain    Home Living                      Prior Function            PT Goals (current goals can now be found in the care plan section) Acute Rehab PT Goals Patient Stated Goal: return to assisted care PT Goal Formulation: With patient Time For Goal Achievement: 07/01/18 Potential to Achieve Goals: Good Progress towards PT goals: Progressing toward goals    Frequency    Min 3X/week      PT Plan Current plan remains appropriate    Co-evaluation              AM-PAC PT "6 Clicks" Mobility   Outcome Measure  Help needed turning from your back to your side while in a flat bed without using bedrails?: Total Help needed moving from lying on your back to sitting on the side of a flat bed without using bedrails?: Total Help needed moving to and from a bed to a chair (including a wheelchair)?: Total Help needed standing up from a chair using your arms (e.g., wheelchair or bedside chair)?: A Lot Help needed to walk in hospital room?: A Lot Help needed climbing 3-5 steps with a railing? : Total 6 Click Score: 8    End of Session   Activity Tolerance: Patient tolerated treatment well;Patient limited by fatigue Patient left: in chair;with call bell/phone within reach Nurse Communication: Mobility status PT Visit Diagnosis: Unsteadiness on feet (R26.81);Other abnormalities of gait and mobility (R26.89);Muscle weakness (generalized) (M62.81)     Time: [id number] PT Time Calculation (min) (ACUTE ONLY): 26 min  Charges:  $Therapeutic Exercise: 8-22 mins $Therapeutic Activity: 8-22 mins                     2:00 PM, 06/27/18 Ocie BobJames Azzam Mehra, MPT Physical Therapist with [facility] 336 [phone] office [phone] mobile phone

## 2018-06-28 DIAGNOSIS — R29818 Other symptoms and signs involving the nervous system: Secondary | ICD-10-CM | POA: Diagnosis not present

## 2018-06-28 DIAGNOSIS — G3185 Corticobasal degeneration: Secondary | ICD-10-CM | POA: Diagnosis not present

## 2018-06-28 DIAGNOSIS — E119 Type 2 diabetes mellitus without complications: Secondary | ICD-10-CM | POA: Diagnosis not present

## 2018-06-28 DIAGNOSIS — I10 Essential (primary) hypertension: Secondary | ICD-10-CM | POA: Diagnosis not present

## 2018-06-28 LAB — GLUCOSE, CAPILLARY
GLUCOSE-CAPILLARY: 90 mg/dL (ref 70–99)
GLUCOSE-CAPILLARY: 182 mg/dL — AB (ref 70–99)
Glucose-Capillary: 109 mg/dL — ABNORMAL HIGH (ref 70–99)
Glucose-Capillary: 122 mg/dL — ABNORMAL HIGH (ref 70–99)
Glucose-Capillary: 132 mg/dL — ABNORMAL HIGH (ref 70–99)
Glucose-Capillary: 220 mg/dL — ABNORMAL HIGH (ref 70–99)

## 2018-06-28 NOTE — Progress Notes (Signed)
CBG not crossing over, 122 on glucometer.

## 2018-06-28 NOTE — Progress Notes (Signed)
Physical Therapy Treatment Patient Details Name: Tracy [name] MRN: [id number] DOB: 13-Sep-1933 Today's Date: 06/28/2018    History of Present Illness Tracy [name] is a 85 y.o. 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  Patient is accompanied by her caregiver who assists with the history.  She reportedly been in her usual state of health today, had just finished her lunch, when she was noted to be slumped over to her right, poorly responsive, and with right facial droop.  There was no fall or trauma reported prior to this and there was no seizure-like activity witnessed.  Patient returned to her usual state within approximately 15 minutes, but went on to have another very similar episode in the evening that prompted her presentation to the ED.  Patient does not recall the events, has no complaints in the emergency department, denies any recent headache, change in vision or hearing, or new focal numbness or weakness.  She denies any chest pain or palpitations.  No recent fevers or chills.  She denies any abdominal pain or diarrhea, and reports that her vomiting in the ED is secondary to motion sickness from the ambulance ride.    PT Comments    Pt still does not have rollator.  Pt able to assist with transfers and take a few steps however, RT LE has increased tone with WB activity.  PT ambulation is limited with needing mod assist and rollator.   Follow Up Recommendations   24 hr supervision     Equipment Recommendations    none   Recommendations for Other Services  none     Precautions / Restrictions Precautions Precautions: Fall Restrictions Weight Bearing Restrictions: No    Mobility  Bed Mobility Overal bed mobility: Needs Assistance Bed Mobility: Supine to Sit     Supine to sit: Mod assist;Max assist     General bed mobility comments: difficulty propping up on right elbow for supine to sitting  Transfers Overall transfer level: Needs assistance Equipment used: Rolling walker (2 wheeled) Transfers: Sit to/from [employer] Sit to Stand: Mod assist;Max assist Stand pivot transfers: Mod assist;Max assist       General transfer comment: able to grip RW with right hand  Ambulation/Gait Ambulation/Gait assistance: Max assist   Assistive device: Rolling walker (2 wheeled) Gait Pattern/deviations: Decreased step length - right;Decreased step length - left;Decreased stride length;Decreased stance time - left Gait velocity: slow   General Gait Details: Pt side stepped at bedside with mod assist           Cognition Arousal/Alertness: Awake/alert Behavior During Therapy: WFL for tasks assessed/performed Overall Cognitive Status: Within Functional Limits for tasks assessed                                        Exercises General Exercises - Lower Extremity Ankle Circles/Pumps: Both;5 reps Long Arc Quad: Both;5 reps Heel Slides: Both;5 reps Hip ABduction/ADduction: Both;5 reps Straight Leg Raises: Both;5 reps Mini-Sqauts: 10 reps(bridging )        Pertinent Vitals/Pain Pain Assessment: No/denies pain           PT Goals (current goals can now be found in the care plan section) Acute Rehab PT Goals Patient Stated Goal: return to  assisted care PT Goal Formulation: With patient Time For Goal Achievement: 07/01/18 Potential to Achieve Goals: Good Progress towards PT goals: Progressing toward goals    Frequency      3x week     PT Plan  Continue to see pt to improve safety in transfers                Time: 1333-1403 PT Time Calculation (min) (ACUTE ONLY): 30 min  Charges:  $Therapeutic Exercise: 8-22 mins $Therapeutic Activity: 8-22 mins                       Virgina Organ,  PT CLT [phone] 06/28/2018, 2:07 PM

## 2018-06-28 NOTE — Progress Notes (Signed)
  Speech Language Pathology Treatment: Dysphagia  Patient Details Name: Tracy [name] MRN: [id number] DOB: 09-01-1933 Today's Date: 06/28/2018 Time: [id number] SLP Time Calculation (min) (ACUTE ONLY): 16 min  Assessment / Plan / Recommendation Clinical Impression  Pt seen in room for ongoing diagnostic dysphagia intervention. She has been consuming D1/puree and NTL since her evaluation with some occasional coughing per nursing staff. Pt presented with trials thin liquids this date and Pt required cues for decreasing bolus size and rate. Pt with audible swallow and delayed cough with thins, however vocal quality remains clear. Will proceed with MBSS tomorrow to objectively evaluate swallow in hopes of advancing Pt back to thin liquids prior to d/c to receiving facility.   HPI HPI: Tracy Tracy [name] is a 85 y.o. 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  Patient is accompanied by her caregiver who assists with the history.  She reportedly been in her usual state of health today, had just finished her lunch, when she was noted to be slumped over to her right, poorly responsive, and with right facial droop.  There was no fall or trauma reported prior to this and there was no seizure-like activity witnessed.  Patient returned to her usual state within approximately 15 minutes, but went on to have another very similar episode in the evening that prompted her presentation to the ED.  Patient does not recall the events, has no complaints in the emergency department, denies any recent headache, change in vision or hearing, or new focal numbness or weakness.  She denies any chest pain or palpitations.  No recent fevers or chills.  She denies any abdominal pain or diarrhea, and reports that her  vomiting in the ED is secondary to motion sickness from the ambulance ride. BSE requested due to Pt failing [facility] screen. The Pt had MBSS in February 2017 with recommendation for D3/thin.      SLP Plan  Continue with current plan of care;MBS(aim for MBS tomorrow)       Recommendations  Diet recommendations: Dysphagia 1 (puree);Nectar-thick liquid Liquids provided via: Cup;Teaspoon Medication Administration: Whole meds with puree Supervision: Patient able to self feed;Full supervision/cueing for compensatory strategies Compensations: Slow rate;Small sips/bites Postural Changes and/or Swallow Maneuvers: Seated upright 90 degrees;Upright 30-60 min after meal                Oral Care Recommendations: Oral care BID;Staff/trained caregiver to provide oral care Follow up Recommendations: 24 hour supervision/assistance SLP Visit Diagnosis: Dysphagia, oropharyngeal phase (R13.12) Plan: Continue with current plan of care;MBS(aim for MBS tomorrow)       Thank you,  Havery MorosDabney Porter, CCC-SLP [phone]                 PORTER,DABNEY 06/28/2018, 4:36 PM

## 2018-06-28 NOTE — Progress Notes (Signed)
Patient ID: Tracy [name], female   DOB: 08/28/1933, 85 y.o.   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 Patient is accompanied by her caregiver who assists with the history. She reportedly been in her usual state of health today, had just finished her lunch, when she was noted to be slumped over to her right, poorly responsive, and with right facial droop. There was no fall or trauma reported prior to this and there was no seizure-like activity witnessed. Patient returned to her usual state within approximately 15 minutes, but went on to have another very similar episode in the evening that prompted her presentation to the ED. Patient does not recall the events, has no complaints in the emergency department, denies any recent headache, change in vision or hearing, or new focal numbness or weakness. She denies any chest pain or palpitations. No recent fevers or chills. She denies any abdominal pain or diarrhea, and reports that her vomiting in the ED is secondary to motion sickness from the ambulance ride.  Assessment & Plan:   Principal Problem:   Transient neurologic deficit Active Problems:   Diabetes mellitus (HCC)   Essential hypertension, benign   Corticobasal degeneration (HCC)   Nausea & vomiting  1.Transient neurologic symptoms -Presents for evaluation of 2 episodes, ~15 minutes each, marked by poor responsiveness and right facial droop, no seizure-like activity -The patient has no complaints and seems to have returned to baseline by time of admissionbut now worse again -No acute findings  on head CTor MRI -Continue cardiac monitoring with frequent neuro checks -w/u neg thus far neg mri, echo , dopplers -continue ASA, PT/OT/SLP consultscompleted.   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  Benign positional vertigo Increase meclizine, improved   DVT prophylaxis: lovenox Code Status: full Family Communication: none Disposition Plan: pending neurology consultation   Subjective: Pt remains confused but able to answer more questions and vocalizing more today.  Objective: Vitals:   06/27/18 2033 06/28/18 0007 06/28/18 0433 06/28/18 1527  BP: (!) 153/58 (!) 166/71 (!) 173/85 140/83  Pulse: 87 86 79 81  Resp: 18 16 18 16   Temp: 98.4 F (36.9 C) 97.7 F (36.5 C) (!) 97.5 F (36.4 C) 97.6 F (36.4 C)  TempSrc: Oral Oral Oral Oral  SpO2: 95% 96% 100% 99%  Weight:      Height:        Intake/Output Summary (Last 24 hours) at 06/28/2018 1546 Last data filed at 06/28/2018 1000 Gross per 24 hour  Intake 240 ml  Output -  Net 240 ml   Filed Weights   06/25/18 2315  Weight: 56.8 kg  Examination:  General exam: Appears calm and comfortable but confused and overall weak Respiratory system: Clear to auscultation. Respiratory effort normal. Cardiovascular system: S1 & S2 heard, RRR. No JVD, murmurs, rubs, gallops or clicks. No pedal  edema. Gastrointestinal system: Abdomen is nondistended, soft and nontender. No organomegaly or masses felt. Normal bowel sounds heard. Central nervous system: Alert and oriented. No focal neurological deficits. Extremities: Symmetric 5 x 5 power. Skin: No rashes, lesions or ulcers Psychiatry: not agitated  Data Reviewed: I have personally reviewed following labs and imaging studies  CBC: Recent Labs  Lab 06/25/18 2020  WBC 13.2*  NEUTROABS 10.0*  HGB 12.0  HCT 36.4  MCV 92.6  PLT 485*   Basic Metabolic Panel: Recent Labs  Lab 06/25/18 2020  NA 139  K 3.4*  CL 103  CO2 25  GLUCOSE 176*  BUN 16  CREATININE 0.76  CALCIUM 6.7*   GFR: Estimated Creatinine Clearance: 48.6 mL/min (by C-G formula based on SCr of 0.76 mg/dL). Liver Function Tests: Recent Labs  Lab 06/25/18 2020  AST 20  ALT 10  ALKPHOS 37*  BILITOT 0.5  PROT 6.8  ALBUMIN 3.8   Recent Labs  Lab 06/25/18 2020  LIPASE 36   No results for input(s): AMMONIA in the last 168 hours. Coagulation Profile: Recent Labs  Lab 06/25/18 2021  INR 0.92   Cardiac Enzymes: Recent Labs  Lab 06/25/18 2020  TROPONINI <0.03   BNP (last 3 results) No results for input(s): PROBNP in the last 8760 hours. HbA1C: Recent Labs    06/26/18 0428  HGBA1C 5.3   CBG: Recent Labs  Lab 06/27/18 2008 06/28/18 0005 06/28/18 0425 06/28/18 0726 06/28/18 1107  GLUCAP 228* 132* 122* 90 182*   Lipid Profile: Recent Labs    06/26/18 0428  CHOL 127  HDL 42  LDLCALC 74  TRIG 55  CHOLHDL 3.0   Thyroid Function Tests: No results for input(s): TSH, T4TOTAL, FREET4, T3FREE, THYROIDAB in the last 72 hours. Anemia Panel: No results for input(s): VITAMINB12, FOLATE, FERRITIN, TIBC, IRON, RETICCTPCT in the last 72 hours. Sepsis Labs: No results for input(s): PROCALCITON, LATICACIDVEN in the last 168 hours.  Recent Results (from the past 240 hour(s))  MRSA PCR Screening     Status: None   Collection Time:  06/26/18 12:40 AM  Result Value Ref Range Status   MRSA by PCR NEGATIVE NEGATIVE Final    Comment:        The GeneXpert MRSA Assay (FDA approved for NASAL specimens only), is one component of a comprehensive MRSA colonization surveillance program. It is not intended to diagnose MRSA infection nor to guide or monitor treatment for MRSA infections. Performed at [facility], [address], [place], [place] [address]     Radiology Studies: No results found.   Scheduled Meds: . aspirin  300 mg Rectal Daily   Or  . aspirin  325 mg Oral Daily  . Carbidopa-Levodopa ER  1 tablet Oral TID  . darifenacin  7.5 mg Oral QHS  . diazepam  1 mg Oral Daily  . enoxaparin (LOVENOX) injection  40 mg Subcutaneous Q24H  . insulin aspart  0-9 Units Subcutaneous Q4H  . meclizine  25 mg Oral BID   Continuous Infusions:   LOS: 0 days    Time spent: 35 min    , MD [facility]   If 7PM-7AM, please contact night-coverage [email or web address] Password [facility] 06/28/2018, 3:46 PM

## 2018-06-29 ENCOUNTER — Observation Stay (HOSPITAL_COMMUNITY): Payer: Medicare Other

## 2018-06-29 DIAGNOSIS — I10 Essential (primary) hypertension: Secondary | ICD-10-CM | POA: Diagnosis not present

## 2018-06-29 DIAGNOSIS — G3185 Corticobasal degeneration: Secondary | ICD-10-CM | POA: Diagnosis not present

## 2018-06-29 DIAGNOSIS — R112 Nausea with vomiting, unspecified: Secondary | ICD-10-CM | POA: Diagnosis not present

## 2018-06-29 DIAGNOSIS — R29818 Other symptoms and signs involving the nervous system: Secondary | ICD-10-CM | POA: Diagnosis not present

## 2018-06-29 LAB — GLUCOSE, CAPILLARY
Glucose-Capillary: 102 mg/dL — ABNORMAL HIGH (ref 70–99)
Glucose-Capillary: 113 mg/dL — ABNORMAL HIGH (ref 70–99)
Glucose-Capillary: 171 mg/dL — ABNORMAL HIGH (ref 70–99)
Glucose-Capillary: 89 mg/dL (ref 70–99)
Glucose-Capillary: 93 mg/dL (ref 70–99)

## 2018-06-29 MED ORDER — STARCH (THICKENING) PO POWD: ORAL | 0 refills | Status: AC

## 2018-06-29 MED ORDER — ASPIRIN EC 81 MG PO TBEC: 81.0000 mg | DELAYED_RELEASE_TABLET | Freq: Every day | ORAL | Status: DC

## 2018-06-29 MED ORDER — DIAZEPAM 2 MG PO TABS: 1.0000 mg | ORAL_TABLET | Freq: Three times a day (TID) | ORAL | 0 refills | Status: DC | PRN

## 2018-06-29 NOTE — Progress Notes (Signed)
Patient discharging back to [facility]. Elects to have home health again with [facility] for PT and SLP services.  Cassie of [facility] notified.

## 2018-06-29 NOTE — Discharge Summary (Addendum)
Physician Discharge Summary  Tracy [name] [id number] DOB: Sep 24, 1933 DOA: 06/25/2018  PCP: Benita StabileHall, John Z, MD Neurologist: Jaquita FoldsSiddiqui, Mustafa, MD  Admit date: 06/25/2018 Discharge date: 06/29/2018  Admitted From: Home  Disposition: Home   Recommendations for Outpatient Follow-up:  1. Follow up with PCP in 1 weeks.  2. Follow up neurologist in 1-2 weeks.  3. Please continue speech therapy for swallowing.  4. Continue home health PT.   Home Health: PT, SLP  Discharge Condition: STABLE   CODE STATUS: DNR    DIET RECOMMENDATIONS:   SLP Diet Recommendations: Dysphagia 1 (Puree) solids;Dysphagia 2 (Fine chop) solids;Honey thick liquids;Free water protocol after oral care   Liquid Administration via: Cup;Straw(ok for straw with HTL)   Medication Administration: Whole meds with puree   Supervision: Patient able to self feed;Full supervision/cueing for compensatory strategies   Compensations: Small sips/bites;Slow rate;Multiple dry swallows after each bite/sip;Clear throat intermittently   Postural Changes: Remain semi-upright after after feeds/meals (Comment);Seated upright at 90 degrees   Oral Care Recommendations: Oral care before and after PO;Staff/trained caregiver to provide oral care   Other Recommendations: Order thickener from pharmacy;Prohibited food (jello, ice cream, thin soups);Clarify dietary restrictions     Brief Hospitalization Summary: Please see all hospital notes, images, labs for full details of the hospitalization. HPI: Tracy [name] is a 85 y.o. 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  Patient is accompanied by her caregiver who assists with the history.  She reportedly been in her usual state of health today, had just finished  her lunch, when she was noted to be slumped over to her right, poorly responsive, and with right facial droop.  There was no fall or trauma reported prior to this and there was no seizure-like activity witnessed.  Patient returned to her usual state within approximately 15 minutes, but went on to have another very similar episode in the evening that prompted her presentation to the ED.  Patient does not recall the events, has no complaints in the emergency department, denies any recent headache, change in vision or hearing, or new focal numbness or weakness.  She denies any chest pain or palpitations.  No recent fevers or chills.  She denies any abdominal pain or diarrhea, and reports that her vomiting in the ED is secondary to motion sickness from the ambulance ride.  ED Course: Upon arrival to the ED, patient is found to be afebrile, saturating well on room air, and with vitals otherwise stable.  Noncontrast head CT is negative for acute intracranial abnormality.  Chemistry panel is notable for glucose 176 and calcium 6.7.  CBC features a leukocytosis to 13,200 and thrombocytosis with platelets of 495,000.  Troponin is undetectable.  Tele-neurology was consulted and recommended admission for further evaluation and management of possible CVA or TIA.  1.Transient neurologic symptoms -Presents for evaluation of 2 episodes, ~15 minutes each, marked by poor responsiveness and right facial droop, no seizure-like activity.  -The patient has no complaints and seems to have returned to baseline -No acute findings on head CTor MRI - cardiac monitoring with frequent neuro checkscompleted, stable and no acute findings -w/u negative for any acute or reversible findings, neg mri, echo , dopplers -continue ASA, PT/OT/SLP consultscompleted.  PT recommending Home health.  Home health SLP ordered.  -  EEG was completed and no findings reported  for epileptic activity.  -  inpatient neurology not available,  will arrange for outpatient follow up with neurology.  Pt is followed at [facility].    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   - Pt advised to follow up with her neurologist in 1-2 weeks after discharge.   3.Nausea and vomiting RESOLVED -Abdominal exam is benign -Patient reports this is secondary to motion sickness from ambulance ride   4.Hypertension -Resume home blood pressure medications.   5.Type II DM -a1c 5.3%.  Resume home treatment plan. Follow up with PCP.   6. Benign positional vertigo - Improved.    DVT prophylaxis: lovenox Code Status: full Family Communication: none Disposition Plan: Home   Discharge Diagnoses:  Principal Problem:   Transient neurologic deficit Active Problems:   Diabetes mellitus (HCC)   Essential hypertension, benign   Corticobasal degeneration (HCC)   Nausea & vomiting  Discharge Instructions:  Allergies as of 06/29/2018      Reactions   Mold Extract [trichophyton] Anaphylaxis   sneezing   Other Other (See Comments), Anaphylaxis   Headaches, breathing issues NOVACAINE - shock Headaches, breathing issues   Ace Inhibitors Cough   Codeine Nausea And Vomiting   Darvon [propoxyphene] Nausea And Vomiting, Other (See Comments)   Novocain [procaine Hcl] Swelling   BP drop   Xylocaine Jelly [lidocaine Hcl]    unknown      Medication List    TAKE these medications   ACCU-CHEK AVIVA PLUS test strip Generic drug:  glucose blood 1 each as needed.   acetaminophen 500 MG tablet Commonly known as:  TYLENOL Take 500 mg by mouth every 6 (six) hours as needed.   amLODipine 5 MG tablet Commonly known as:  NORVASC Take 5 mg by mouth daily.   ARTIFICIAL TEARS OP Apply 1 drop to eye 4 (four) times daily as needed.   aspirin EC 81 MG tablet Take 81 mg by mouth daily.   calcium carbonate 500 MG chewable  tablet Commonly known as:  TUMS - dosed in mg elemental calcium Chew 2 tablets by mouth 2 (two) times daily.   Carbidopa-Levodopa ER 25-100 MG tablet controlled release Commonly known as:  SINEMET CR TK ONE T PO TID   cetirizine 10 MG tablet Commonly known as:  ZYRTEC Take 5 mg by mouth daily.   diazepam 2 MG tablet Commonly known as:  VALIUM Take 0.5 tablets (1 mg total) by mouth every 8 (eight) hours as needed for anxiety or sedation. What changed:    how much to take  when to take this  reasons to take this   losartan 50 MG tablet Commonly known as:  COZAAR TK 1 T PO QD   meclizine 12.5 MG tablet Commonly known as:  ANTIVERT Take 12.5 mg by mouth every 6 (six) hours as needed for dizziness.   Melatonin 10 MG Tabs Take 10 mg by mouth at bedtime.   metFORMIN 500 MG tablet Commonly known as:  GLUCOPHAGE Take 500 mg by mouth 3 (three) times daily with meals.   nystatin cream Commonly known as:  MYCOSTATIN Apply 1 application topically as needed for dry skin.   omeprazole 10 MG capsule Commonly known as:  PRILOSEC Take 10 mg by mouth daily.   PRESERVISION AREDS 2+MULTI VIT Caps Take 1 capsule by mouth daily.   tiZANidine 2 MG tablet Commonly known as:  ZANAFLEX Take 2 mg by mouth 2 (two) times daily.  At 8am and 2 pm   tiZANidine 2 MG tablet Commonly known as:  ZANAFLEX Take 4 mg by mouth every evening. Every evening at 8pm   VESICARE 5 MG tablet Generic drug:  solifenacin Take 2.5 mg by mouth at bedtime.   XEOMIN IM Inject into the muscle every 3 (three) months.   zinc oxide 20 % ointment Apply 1 application topically as needed for irritation.       Contact information for follow-up providers    Benita Stabile, MD. Schedule an appointment as soon as possible for a visit in 1 week(s).   Specialty:  Internal Medicine Why:  Hospital Follow Up   Contact information: [address] [place] [facility] [address] [phone]        Jaquita Folds,  MD. Schedule an appointment as soon as possible for a visit in 2 week(s).   Specialty:  Neurology Why:  Hospital Follow Up appointment Contact information: [facility] [place] [address] [phone]        [facility] Follow up.   Specialty:  Home Health Services Why:  PT and SLP Contact information: [address] [place] [address] [phone]            Contact information for after-discharge care    Destination    [facility] .   Service:  Group Home Contact information: [address] [place] [address] [phone]                 Allergies  Allergen Reactions  . Mold Extract [Trichophyton] Anaphylaxis    sneezing  . Other Other (See Comments) and Anaphylaxis    Headaches, breathing issues NOVACAINE - shock Headaches, breathing issues  . Ace Inhibitors Cough  . Codeine Nausea And Vomiting  . Darvon [Propoxyphene] Nausea And Vomiting and Other (See Comments)  . Novocain [Procaine Hcl] Swelling    BP drop  . Xylocaine Jelly [Lidocaine Hcl]     unknown   Allergies as of 06/29/2018      Reactions   Mold Extract [trichophyton] Anaphylaxis   sneezing   Other Other (See Comments), Anaphylaxis   Headaches, breathing issues NOVACAINE - shock Headaches, breathing issues   Ace Inhibitors Cough   Codeine Nausea And Vomiting   Darvon [propoxyphene] Nausea And Vomiting, Other (See Comments)   Novocain [procaine Hcl] Swelling   BP drop   Xylocaine Jelly [lidocaine Hcl]    unknown      Medication List    TAKE these medications   ACCU-CHEK AVIVA PLUS test strip Generic drug:  glucose blood 1 each as needed.   acetaminophen 500 MG tablet Commonly known as:  TYLENOL Take 500 mg by mouth every 6 (six) hours as needed.   amLODipine 5 MG tablet Commonly known as:  NORVASC Take 5 mg by mouth daily.   ARTIFICIAL TEARS OP Apply 1 drop to eye 4 (four) times daily as needed.    aspirin EC 81 MG tablet Take 81 mg by mouth daily.   calcium carbonate 500 MG chewable tablet Commonly known as:  TUMS - dosed in mg elemental calcium Chew 2 tablets by mouth 2 (two) times daily.   Carbidopa-Levodopa ER 25-100 MG tablet controlled release Commonly known as:  SINEMET CR TK ONE T PO TID   cetirizine 10 MG tablet Commonly known as:  ZYRTEC Take 5 mg by mouth daily.   diazepam 2 MG tablet Commonly known as:  VALIUM Take  0.5 tablets (1 mg total) by mouth every 8 (eight) hours as needed for anxiety or sedation. What changed:    how much to take  when to take this  reasons to take this   losartan 50 MG tablet Commonly known as:  COZAAR TK 1 T PO QD   meclizine 12.5 MG tablet Commonly known as:  ANTIVERT Take 12.5 mg by mouth every 6 (six) hours as needed for dizziness.   Melatonin 10 MG Tabs Take 10 mg by mouth at bedtime.   metFORMIN 500 MG tablet Commonly known as:  GLUCOPHAGE Take 500 mg by mouth 3 (three) times daily with meals.   nystatin cream Commonly known as:  MYCOSTATIN Apply 1 application topically as needed for dry skin.   omeprazole 10 MG capsule Commonly known as:  PRILOSEC Take 10 mg by mouth daily.   PRESERVISION AREDS 2+MULTI VIT Caps Take 1 capsule by mouth daily.   tiZANidine 2 MG tablet Commonly known as:  ZANAFLEX Take 2 mg by mouth 2 (two) times daily. At 8am and 2 pm   tiZANidine 2 MG tablet Commonly known as:  ZANAFLEX Take 4 mg by mouth every evening. Every evening at 8pm   VESICARE 5 MG tablet Generic drug:  solifenacin Take 2.5 mg by mouth at bedtime.   XEOMIN IM Inject into the muscle every 3 (three) months.   zinc oxide 20 % ointment Apply 1 application topically as needed for irritation.       Procedures/Studies: Mr Brain Wo Contrast  Result Date: 06/26/2018 CLINICAL DATA:  Altered mental status. EXAM: MRI HEAD WITHOUT CONTRAST TECHNIQUE: Multiplanar, multiecho pulse sequences of the brain and  surrounding structures were obtained without intravenous contrast. COMPARISON:  Head CT 06/25/2018 and MRI 03/27/2014 FINDINGS: The study is mildly to moderately motion degraded throughout. Brain: There is no evidence of acute infarct, intracranial hemorrhage, mass, midline shift, or extra-axial fluid collection. Generalized cerebral atrophy is mild for age. Patchy cerebral white matter T2 hyperintensities are nonspecific but compatible with mild-to-moderate chronic small vessel ischemic disease, likely mildly progressed from 2015. Vascular: Major intracranial vascular flow voids are preserved. Skull and upper cervical spine: No suspicious marrow lesion. Sinuses/Orbits: Unremarkable orbits. Clear paranasal sinuses. Trace bilateral mastoid effusions. Other: None. IMPRESSION: 1. Motion degraded examination without evidence of acute intracranial abnormality. 2. Mild-to-moderate chronic small vessel ischemic disease. Electronically Signed   By: Sebastian Ache M.D.   On: 06/26/2018 08:46   US Carotid Bilateral  Result Date: 06/26/2018 CLINICAL DATA:  Transient neurologic deficit. History of hypertension and diabetes. EXAM: BILATERAL CAROTID DUPLEX ULTRASOUND TECHNIQUE: Wallace Cullens scale imaging, color Doppler and duplex ultrasound were performed of bilateral carotid and vertebral arteries in the neck. COMPARISON:  None. FINDINGS: Criteria: Quantification of carotid stenosis is based on velocity parameters that correlate the residual internal carotid diameter with NASCET-based stenosis levels, using the diameter of the distal internal carotid lumen as the denominator for stenosis measurement. The following velocity measurements were obtained: RIGHT ICA: 105/21 cm/sec CCA: 126/15 cm/sec SYSTOLIC ICA/CCA RATIO:  0.8 ECA: 143 cm/sec LEFT ICA: 103/21 cm/sec CCA: 106/16 cm/sec SYSTOLIC ICA/CCA RATIO:  1.0 ECA: 150 cm/sec RIGHT CAROTID ARTERY: There is a minimal amount of eccentric mixed echogenic plaque within the right carotid  bulb (image 19), extending to involve the origin and proximal aspects of the right internal carotid artery (image 31, not resulting in elevated peak systolic velocities within the interrogated course the right internal carotid artery to suggest a hemodynamically significant stenosis. RIGHT VERTEBRAL  ARTERY:  Not visualized LEFT CAROTID ARTERY: There is a moderate amount of eccentric echogenic partially shadowing plaque within the left carotid bulb (image 56), extending to involve the origin and proximal aspect the left internal carotid artery (image 65, not resulting in elevated peak systolic velocities within the interrogated course the left internal carotid artery to suggest a hemodynamically significant stenosis. LEFT VERTEBRAL ARTERY:  Antegrade Flow IMPRESSION: 1. Minimal to moderate amount of bilateral atherosclerotic plaque, left greater than right, not resulting in a hemodynamically significant stenosis within either internal carotid artery. 2. Non visualization of a patent right vertebral artery, potentially technique related due to patient limited mobility and of uncertain clinical significance as a dominant and/or solitary left vertebral artery is a relatively common asymptomatic congenital variant. Electronically Signed   By: Simonne Come M.D.   On: 06/26/2018 10:20   Dg Chest Port 1 View  Result Date: 06/25/2018 CLINICAL DATA:  Acute onset of respiratory failure. Hypoxia. Altered mental status. EXAM: PORTABLE CHEST 1 VIEW COMPARISON:  Chest radiograph performed 08/13/2016 FINDINGS: The lungs are well-aerated. Minimal right basilar airspace opacity could reflect mild pneumonia. There is no evidence of pleural effusion or pneumothorax. The cardiomediastinal silhouette is mildly enlarged. No acute osseous abnormalities are seen. IMPRESSION: Minimal right basilar airspace opacity could reflect mild pneumonia. Mild cardiomegaly noted. Electronically Signed   By: Roanna Raider M.D.   On: 06/25/2018 22:18    Dg Swallowing Func-speech Pathology  Result Date: 06/29/2018 Objective Swallowing Evaluation: Type of Study: MBS-Modified Barium Swallow Study  Patient Details Name: Tracy [name] MRN: [id number] Date of Birth: 07-Mar-1934 Today's Date: 06/29/2018 Time: SLP Start Time (ACUTE ONLY): 1150 -SLP Stop Time (ACUTE ONLY): 1220 SLP Time Calculation (min) (ACUTE ONLY): 30 min Past Medical History: Past Medical History: Diagnosis Date . Carpal tunnel syndrome  . Corticobasal degeneration (HCC) 04/09/2015 . Diabetes mellitus  . Fibrocystic breast disease  . Fibromyalgia  . Focal dystonia  . GERD (gastroesophageal reflux disease)  . Hypertension  . Leukopenia  . Mild cognitive impairment  . RBBB  . Vitamin B12 deficiency  Past Surgical History: Past Surgical History: Procedure Laterality Date . APPENDECTOMY   . CARDIAC CATHETERIZATION    normal . TONSILLECTOMY   HPI: Tracy [name] is a 85 y.o. 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  Patient is accompanied by her caregiver who assists with the history.  She reportedly been in her usual state of health today, had just finished her lunch, when she was noted to be slumped over to her right, poorly responsive, and with right facial droop.  There was no fall or trauma reported prior to this and there was no seizure-like activity witnessed.  Patient returned to her usual state within approximately 15 minutes, but went on to have another very similar episode in the evening that prompted her presentation to the ED.  Patient does not recall the events, has no complaints in the emergency department, denies any recent headache, change in vision or hearing, or new focal numbness or weakness.  She denies any chest pain or palpitations.  No recent fevers or chills.   She denies any abdominal pain or diarrhea, and reports that her vomiting in the ED is secondary to motion sickness from the ambulance ride. BSE requested due to Pt failing [facility] screen. The Pt had MBSS  in February 2017 with recommendation for D3/thin.  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." Assessment / Plan / Recommendation CHL IP CLINICAL IMPRESSIONS 06/29/2018 Clinical Impression Pt presents with moderate oropharyngeal phase dysphagia characterized by reduced lingual strength and manipulation, reduced bolus cohesiveness with premature spillage with liquids, reduced tongue base retraction, mildly reduced epiglottic deflection, reduced laryngeal closure, and mild/mod delay in swallow initiation resulting in variable penetration/silent aspiration before, during, and after the swallow with thins and nectars despite attempted modifications (Pt unable to implement chin tuck) and vallecular residuals post swallow and variable pyriform residuals with thins. Pt inconsistently aspirated thins and nectars and all were silent. Interestingly, the Pt presented with strong coughing at bedside during thin trials and occasionally during meals with nectars. Pt successfully swallowed the barium tablet whole in puree. Recommend D1/puree and honey-thick liquids via cup or straw and po medications whole in puree; Frazier water protocol at facility once trained by [facility] SLP; trials ground solids when presented in cohesive bolus (puree). Also recommend short term dysphagia therapy for lingual strengthening, improving cough, training strategies, and implementation of Frazier water protocol.  SLP Visit Diagnosis Dysphagia, oropharyngeal phase (R13.12) Attention and concentration deficit following -- Frontal lobe and executive function deficit following -- Impact on safety and function Moderate aspiration risk;Risk for inadequate nutrition/hydration   CHL IP TREATMENT RECOMMENDATION 06/29/2018 Treatment Recommendations (No Data)   Prognosis 06/29/2018  Prognosis for Safe Diet Advancement Fair Barriers to Reach Goals Severity of deficits Barriers/Prognosis Comment -- CHL IP DIET RECOMMENDATION 06/29/2018 SLP Diet Recommendations Dysphagia 1 (Puree) solids;Dysphagia 2 (Fine chop) solids;Honey thick liquids;Free water protocol after oral care Liquid Administration via Cup;Straw Medication Administration Whole meds with puree Compensations Small sips/bites;Slow rate;Multiple dry swallows after each bite/sip;Clear throat intermittently Postural Changes Remain semi-upright after after feeds/meals (Comment);Seated upright at 90 degrees   CHL IP OTHER RECOMMENDATIONS 06/29/2018 Recommended Consults -- Oral Care Recommendations Oral care before and after PO;Staff/trained caregiver to provide oral care Other Recommendations Order thickener from pharmacy;Prohibited food (jello, ice cream, thin soups);Clarify dietary restrictions   CHL IP FOLLOW UP RECOMMENDATIONS 06/29/2018 Follow up Recommendations Home health SLP   CHL IP FREQUENCY AND DURATION 06/26/2018 Speech Therapy Frequency (ACUTE ONLY) min 2x/week Treatment Duration 1 week      CHL IP ORAL PHASE 06/29/2018 Oral Phase Impaired Oral - Pudding Teaspoon -- Oral - Pudding Cup -- Oral - Honey Teaspoon -- Oral - Honey Cup Weak lingual manipulation;Premature spillage;Decreased bolus cohesion Oral - Nectar Teaspoon Weak lingual manipulation;Premature spillage Oral - Nectar Cup Premature spillage;Decreased bolus cohesion;Weak lingual manipulation Oral - Nectar Straw -- Oral - Thin Teaspoon Premature spillage Oral - Thin Cup Premature spillage;Decreased bolus cohesion;Weak lingual manipulation Oral - Thin Straw -- Oral - Puree Weak lingual manipulation;Decreased bolus cohesion Oral - Mech Soft Piecemeal swallowing;Delayed oral transit;Decreased bolus cohesion;Weak lingual manipulation Oral - Regular -- Oral - Multi-Consistency -- Oral - Pill Delayed oral transit Oral Phase - Comment weak lingual manipulation, reduced bolus  cohesiveness  CHL IP PHARYNGEAL PHASE 06/29/2018 Pharyngeal Phase Impaired Pharyngeal- Pudding Teaspoon -- Pharyngeal -- Pharyngeal- Pudding Cup -- Pharyngeal -- Pharyngeal- Honey Teaspoon -- Pharyngeal -- Pharyngeal- Honey Cup Delayed swallow initiation-vallecula;Reduced tongue base retraction;Pharyngeal residue - valleculae Pharyngeal -- Pharyngeal- Nectar Teaspoon Delayed swallow initiation-vallecula;Pharyngeal residue - valleculae;Penetration/Apiration after swallow;Reduced airway/laryngeal closure;Reduced epiglottic inversion;Reduced tongue base retraction Pharyngeal Material enters airway, remains ABOVE vocal cords and not ejected out Pharyngeal- Nectar Cup Delayed swallow initiation-pyriform sinuses;Reduced epiglottic inversion;Reduced tongue base retraction;Penetration/Aspiration before swallow;Penetration/Aspiration during swallow;Trace aspiration;Pharyngeal residue - valleculae Pharyngeal  Material does not enter airway;Material enters airway, remains ABOVE vocal cords and not ejected out;Material enters airway, passes BELOW cords without attempt by patient to eject out (silent aspiration);Material enters airway, passes BELOW cords and not ejected out despite cough attempt by patient Pharyngeal- Nectar Straw Pharyngeal residue - pyriform;Delayed swallow initiation-pyriform sinuses;Reduced epiglottic inversion;Reduced airway/laryngeal closure;Reduced tongue base retraction;Penetration/Aspiration during swallow;Moderate aspiration Pharyngeal Material enters airway, passes BELOW cords without attempt by patient to eject out (silent aspiration) Pharyngeal- Thin Teaspoon Delayed swallow initiation-pyriform sinuses;Penetration/Aspiration during swallow;Reduced airway/laryngeal closure Pharyngeal Material enters airway, passes BELOW cords without attempt by patient to eject out (silent aspiration) Pharyngeal- Thin Cup Delayed swallow initiation-pyriform sinuses;Reduced epiglottic inversion;Reduced airway/laryngeal  closure;Pharyngeal residue - pyriform;Pharyngeal residue - valleculae;Compensatory strategies attempted (with notebox);Penetration/Aspiration during swallow;Penetration/Apiration after swallow;Trace aspiration Pharyngeal Material enters airway, passes BELOW cords without attempt by patient to eject out (silent aspiration);Material enters airway, remains ABOVE vocal cords then ejected out Pharyngeal- Thin Straw -- Pharyngeal -- Pharyngeal- Puree Delayed swallow initiation-vallecula;Reduced epiglottic inversion Pharyngeal -- Pharyngeal- Mechanical Soft Delayed swallow initiation-vallecula;Reduced epiglottic inversion Pharyngeal -- Pharyngeal- Regular -- Pharyngeal -- Pharyngeal- Multi-consistency -- Pharyngeal -- Pharyngeal- Pill Delayed swallow initiation-vallecula Pharyngeal -- Pharyngeal Comment premature spillage, delay in swallow initiation, reduced tongue base retraction and epiglottic deflection, reduced laryngeal closure  CHL IP CERVICAL ESOPHAGEAL PHASE 06/29/2018 Cervical Esophageal Phase WFL Pudding Teaspoon -- Pudding Cup -- Honey Teaspoon -- Honey Cup -- Nectar Teaspoon -- Nectar Cup -- Nectar Straw -- Thin Teaspoon -- Thin Cup -- Thin Straw -- Puree -- Mechanical Soft -- Regular -- Multi-consistency -- Pill -- Cervical Esophageal Comment -- Thank you, Havery Moros, CCC-SLP [phone] PORTER,DABNEY 06/29/2018, 1:13 PM              Ct Head Code Stroke Wo Contrast  Result Date: 06/25/2018 CLINICAL DATA:  Code stroke. Altered mental status. Transient facial droop earlier today. Vomiting. EXAM: CT HEAD WITHOUT CONTRAST TECHNIQUE: Contiguous axial images were obtained from the base of the skull through the vertex without intravenous contrast. COMPARISON:  06/02/2014 head CT and 03/27/2014 MRI FINDINGS: Brain: There is no evidence of acute infarct, intracranial hemorrhage, mass, midline shift, or extra-axial fluid collection. There is mild cerebral atrophy. Cerebral white matter hypodensities are stable to  slightly progressive and nonspecific but compatible with mild-to-moderate chronic small vessel ischemic disease. Vascular: No hyperdense vessel. Skull: No fracture or focal osseous lesion allowing for motion artifact through the skull base. Sinuses/Orbits: Visualized paranasal sinuses and mastoid air cells are grossly clear. Orbits are grossly unremarkable. Other: None. ASPECTS [facility] Stroke Program Early CT Score) - Ganglionic level infarction (caudate, lentiform nuclei, internal capsule, insula, M1-M3 cortex): 7 - Supraganglionic infarction (M4-M6 cortex): 3 Total score (0-10 with 10 being normal): 10 IMPRESSION: 1. No evidence of acute intracranial abnormality. 2. ASPECTS is 10. 3. Mild-to-moderate chronic small vessel ischemic disease. These results were called by telephone at the time of interpretation on 06/25/2018 at 8:29 pm to Dr. Meridee Score , who verbally acknowledged these results. Electronically Signed   By: Sebastian Ache M.D.   On: 06/25/2018 20:29     Subjective: Pt without complaints, no recurrence of episode.  Discharge Exam: Vitals:   06/29/18 0400 06/29/18 1342  BP: (!) 161/70 140/76  Pulse: 79 87  Resp: 18 16  Temp: (!) 97.5 F (36.4 C)   SpO2: 98% 99%   Vitals:   06/28/18 2022 06/29/18 0000 06/29/18 0400 06/29/18 1342  BP: (!) 159/55 (!) 155/84 (!) 161/70 140/76  Pulse: 81 80 79 87  Resp: 18 16 18 16   Temp: 98.2 F (36.8 C) 98.3 F (36.8 C) (!) 97.5 F (36.4 C)   TempSrc: Oral Oral Oral   SpO2: 98% 99% 98% 99%  Weight:      Height:       General exam: Appears calm and comfortable and overall weak.  Respiratory system: Clear to auscultation. Respiratory effort normal. Cardiovascular system: normal S1 & S2 heard. No JVD, murmurs, rubs, gallops or clicks. No pedal edema. Gastrointestinal system: Abdomen is nondistended, soft and nontender. No organomegaly or masses felt. Normal bowel sounds heard. Central nervous system: Alert and oriented. No focal neurological  deficits. Extremities: Symmetric 5 x 5 power. Skin: No rashes, lesions or ulcers Psychiatry: not agitated   The results of significant diagnostics from this hospitalization (including imaging, microbiology, ancillary and laboratory) are listed below for reference.     Microbiology: Recent Results (from the past 240 hour(s))  MRSA PCR Screening     Status: None   Collection Time: 06/26/18 12:40 AM  Result Value Ref Range Status   MRSA by PCR NEGATIVE NEGATIVE Final    Comment:        The GeneXpert MRSA Assay (FDA approved for NASAL specimens only), is one component of a comprehensive MRSA colonization surveillance program. It is not intended to diagnose MRSA infection nor to guide or monitor treatment for MRSA infections. Performed at [facility], [address], [place], [place] [address]      Labs: BNP (last 3 results) No results for input(s): BNP in the last 8760 hours. Basic Metabolic Panel: Recent Labs  Lab 06/25/18 2020  NA 139  K 3.4*  CL 103  CO2 25  GLUCOSE 176*  BUN 16  CREATININE 0.76  CALCIUM 6.7*   Liver Function Tests: Recent Labs  Lab 06/25/18 2020  AST 20  ALT 10  ALKPHOS 37*  BILITOT 0.5  PROT 6.8  ALBUMIN 3.8   Recent Labs  Lab 06/25/18 2020  LIPASE 36   No results for input(s): AMMONIA in the last 168 hours. CBC: Recent Labs  Lab 06/25/18 2020  WBC 13.2*  NEUTROABS 10.0*  HGB 12.0  HCT 36.4  MCV 92.6  PLT 485*   Cardiac Enzymes: Recent Labs  Lab 06/25/18 2020  TROPONINI <0.03   BNP: Invalid input(s): POCBNP CBG: Recent Labs  Lab 06/28/18 2017 06/29/18 0022 06/29/18 0336 06/29/18 0754 06/29/18 1228  GLUCAP 220* 89 102* 113* 171*   D-Dimer No results for input(s): DDIMER in the last 72 hours. Hgb A1c No results for input(s): HGBA1C in the last 72 hours. Lipid Profile No results for input(s): CHOL, HDL, LDLCALC, TRIG, CHOLHDL, LDLDIRECT in the last 72 hours. Thyroid function studies No results for  input(s): TSH, T4TOTAL, T3FREE, THYROIDAB in the last 72 hours.  Invalid input(s): FREET3 Anemia work up No results for input(s): VITAMINB12, FOLATE, FERRITIN, TIBC, IRON, RETICCTPCT in the last 72 hours. Urinalysis    Component Value Date/Time   COLORURINE YELLOW 06/25/2018 1952   APPEARANCEUR CLEAR 06/25/2018 1952   LABSPEC 1.010 06/25/2018 1952   PHURINE 6.0 06/25/2018 1952   GLUCOSEU NEGATIVE 06/25/2018 1952   HGBUR NEGATIVE 06/25/2018 1952   BILIRUBINUR NEGATIVE 06/25/2018 1952   KETONESUR 20 (A) 06/25/2018 1952   PROTEINUR NEGATIVE 06/25/2018 1952   UROBILINOGEN 0.2 01/27/2015 1513   NITRITE NEGATIVE 06/25/2018 1952   LEUKOCYTESUR NEGATIVE 06/25/2018 1952   Sepsis Labs Invalid input(s): PROCALCITONIN,  WBC,  LACTICIDVEN Microbiology Recent Results (from the past 240 hour(s))  MRSA PCR Screening     Status: None   Collection Time: 06/26/18 12:40 AM  Result Value Ref Range Status   MRSA by PCR NEGATIVE NEGATIVE Final    Comment:        The GeneXpert MRSA Assay (FDA approved for NASAL specimens only), is one component of a comprehensive MRSA colonization surveillance program. It is not intended to diagnose MRSA infection nor to guide or monitor treatment for MRSA infections. Performed at [facility], [address] [place], [place] [address]    Time coordinating discharge: 35 minutes   SIGNED:  Standley Dakinslanford Joriel Streety, MD  [facility] 06/29/2018, 3:32 PM Pager [phone]  If 7PM-7AM, please contact night-coverage [email or web address] Password [email or web address]

## 2018-06-29 NOTE — Progress Notes (Signed)
Modified Barium Swallow Progress Note  Patient Details  Name: Tracy [name] MRN: [id number] Date of Birth: [birth year]  Today's Date: 06/29/2018  Modified Barium Swallow completed.  Full report located under Chart Review in the Imaging Section.  Brief recommendations include the following:  Clinical Impression  Pt presents with moderate oropharyngeal phase dysphagia characterized by reduced lingual strength and manipulation, reduced bolus cohesiveness with premature spillage with liquids, reduced tongue base retraction, mildly reduced epiglottic deflection, reduced laryngeal closure, and mild/mod delay in swallow initiation resulting in variable penetration/silent aspiration before, during, and after the swallow with thins and nectars despite attempted modifications (Pt unable to implement chin tuck) and vallecular residuals post swallow and variable pyriform residuals with thins. Pt inconsistently aspirated thins and nectars and all were silent. Interestingly, the Pt presented with strong coughing at bedside during thin trials and occasionally during meals with nectars. Pt successfully swallowed the barium tablet whole in puree. Recommend D1/puree and honey-thick liquids via cup or straw and po medications whole in puree; Frazier water protocol at facility once trained by [facility] SLP; trials ground solids when presented in cohesive bolus (puree). Also recommend short term dysphagia therapy for lingual strengthening, improving cough, training strategies, and implementation of Frazier water protocol.     Swallow Evaluation Recommendations       SLP Diet Recommendations: Dysphagia 1 (Puree) solids;Dysphagia 2 (Fine chop) solids;Honey thick liquids;Free water protocol after oral care   Liquid Administration via: Cup;Straw(ok for straw with HTL)   Medication Administration: Whole meds with puree   Supervision: Patient able to self feed;Full supervision/cueing for compensatory strategies   Compensations: Small sips/bites;Slow rate;Multiple dry swallows after each bite/sip;Clear throat intermittently   Postural Changes: Remain semi-upright after after feeds/meals (Comment);Seated upright at 90 degrees   Oral Care Recommendations: Oral care before and after PO;Staff/trained caregiver to provide oral care   Other Recommendations: Order thickener from pharmacy;Prohibited food (jello, ice cream, thin soups);Clarify dietary restrictions   Thank you,  Havery Moros, CCC-SLP [phone]  Tracy [name] 06/29/2018,1:01 PM

## 2018-06-29 NOTE — Clinical Social Work Note (Signed)
Patient has been a resident at [facility] for a few years. She ambulates with heavy assistance and is total care. She can feed herself.  Her ADLs are completed by staff.    LCSW advised administrator, Hedda Slade, of discharge and sent discharge clinicals.  LCSW notified son, [name] of discharge.  LCSW signing off.    Frederico Gerling, Juleen China, LCSW

## 2018-06-29 NOTE — NC FL2 (Signed)
[place] MEDICAID FL2 LEVEL OF CARE SCREENING TOOL     IDENTIFICATION  Patient Name: Tracy [name] Birthdate: [birth year] Sex: female Admission Date (Current Location): 06/25/2018  [facility] and [place] Number:  [employer] and Address:  [facility],  [address], [place] [address]      Provider Number: [id number]  Attending Physician Name and Address:  Cleora Fleet, MD  Relative Name and Phone Number:       Current Level of Care: Other (Comment)(observation) Recommended Level of Care: Family Care Home Prior Approval Number:    Date Approved/Denied:   PASRR Number:    Discharge Plan: Domiciliary (Rest home)([facility])    Current Diagnoses: Patient Active Problem List   Diagnosis Date Noted  . Transient neurologic deficit 06/25/2018  . Nausea & vomiting 06/25/2018  . Left spastic hemiparesis (HCC) 03/22/2018  . Abnormality of gait 04/28/2016  . Dysphagia, pharyngoesophageal phase 04/28/2016  . 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 RBBB (right bundle branch block with left anterior fascicular block) 08/07/2013  . Essential hypertension, benign 07/30/2013  . Mixed hyperlipidemia 07/30/2013  . Diabetes mellitus (HCC) 06/23/2013  . Focal dystonia 04/02/2013  . Dysarthria-clumsy hand syndrome 10/25/2012    Orientation RESPIRATION BLADDER Height & Weight     Self, Place  Normal Incontinent Weight: 125 lb 3.5 oz (56.8 kg) Height:  5\' 5"  (165.1 cm)  BEHAVIORAL SYMPTOMS/MOOD NEUROLOGICAL BOWEL NUTRITION STATUS      Incontinent (Dysphagia 1 (Puree) solids;Dysphagia 2 (Fine chop) solids;Honey thick liquids;Free water protocol after oral care)  AMBULATORY STATUS COMMUNICATION OF NEEDS Skin   Extensive Assist Verbally Normal                       Personal Care Assistance Level of Assistance  Bathing, Feeding, Dressing Bathing Assistance: Maximum assistance Feeding  assistance: Independent Dressing Assistance: Maximum assistance     Functional Limitations Info             SPECIAL CARE FACTORS FREQUENCY  PT (By licensed PT), Speech therapy     PT Frequency: 3x/week OT Frequency: no follow up recommended from OT evaluation      Speech Therapy Frequency: 3x/week      Contractures Contractures Info: Not present    Additional Factors Info  Code Status, Allergies, Psychotropic Code Status Info: DNR Allergies Info: Mold Extract, Other (NOvacaine), Ace Inhibitors, Codeine, Darvon, Novocain, Xylocaine Jelly  Psychotropic Info: Valium         Current Medications (06/29/2018):  This is the current hospital active medication list Current Facility-Administered Medications  Medication Dose Route Frequency Provider Last Rate Last Dose  . acetaminophen (TYLENOL) tablet 650 mg  650 mg Oral Q4H PRN Opyd, Lavone Neri, MD   650 mg at 06/28/18 0134   Or  . acetaminophen (TYLENOL) solution 650 mg  650 mg Per Tube Q4H PRN Opyd, Lavone Neri, MD       Or  . acetaminophen (TYLENOL) suppository 650 mg  650 mg Rectal Q4H PRN Opyd, Lavone Neri, MD      . aspirin suppository 300 mg  300 mg Rectal Daily Opyd, Lavone Neri, MD   300 mg at 06/26/18 1114   Or  . aspirin tablet 325 mg  325 mg Oral Daily Opyd, Lavone Neri, MD   325 mg at 06/28/18 0952  . Carbidopa-Levodopa ER (SINEMET CR) 25-100 MG tablet controlled release 1 tablet  1 tablet Oral TID  Briscoe Deutscherpyd, Timothy S, MD   1 tablet at 06/29/18 0915  . darifenacin (ENABLEX) 24 hr tablet 7.5 mg  7.5 mg Oral QHS Opyd, Lavone Neriimothy S, MD   7.5 mg at 06/28/18 2121  . diazepam (VALIUM) tablet 1 mg  1 mg Oral Daily Opyd, Lavone Neriimothy S, MD   1 mg at 06/29/18 0915  . enoxaparin (LOVENOX) injection 40 mg  40 mg Subcutaneous Q24H Opyd, Lavone Neriimothy S, MD   40 mg at 06/29/18 0916  . insulin aspart (novoLOG) injection 0-9 Units  0-9 Units Subcutaneous Q4H Opyd, Lavone Neriimothy S, MD   2 Units at 06/29/18 1443  . meclizine (ANTIVERT) tablet 25 mg  25 mg Oral BID  Haydee Monicaavid, Rachal A, MD   25 mg at 06/29/18 0915  . ondansetron (ZOFRAN) injection 4 mg  4 mg Intravenous Q6H PRN Opyd, Lavone Neriimothy S, MD   4 mg at 06/26/18 0947  . polyvinyl alcohol (LIQUIFILM TEARS) 1.4 % ophthalmic solution 1 drop  1 drop Both Eyes QID PRN Opyd, Lavone Neriimothy S, MD      . promethazine (PHENERGAN) injection 12.5 mg  12.5 mg Intravenous Q6H PRN Opyd, Lavone Neriimothy S, MD      . senna-docusate (Senokot-S) tablet 1 tablet  1 tablet Oral QHS PRN Opyd, Lavone Neriimothy S, MD         Discharge Medications: TAKE these medications   ACCU-CHEK AVIVA PLUS test strip Generic drug:  glucose blood 1 each as needed.   acetaminophen 500 MG tablet Commonly known as:  TYLENOL Take 500 mg by mouth every 6 (six) hours as needed.   amLODipine 5 MG tablet Commonly known as:  NORVASC Take 5 mg by mouth daily.   ARTIFICIAL TEARS OP Apply 1 drop to eye 4 (four) times daily as needed.   aspirin EC 81 MG tablet Take 81 mg by mouth daily.   calcium carbonate 500 MG chewable tablet Commonly known as:  TUMS - dosed in mg elemental calcium Chew 2 tablets by mouth 2 (two) times daily.   Carbidopa-Levodopa ER 25-100 MG tablet controlled release Commonly known as:  SINEMET CR TK ONE T PO TID   cetirizine 10 MG tablet Commonly known as:  ZYRTEC Take 5 mg by mouth daily.   diazepam 2 MG tablet Commonly known as:  VALIUM Take 0.5 tablets (1 mg total) by mouth every 8 (eight) hours as needed for anxiety or sedation. What changed:    how much to take  when to take this  reasons to take this   losartan 50 MG tablet Commonly known as:  COZAAR TK 1 T PO QD   meclizine 12.5 MG tablet Commonly known as:  ANTIVERT Take 12.5 mg by mouth every 6 (six) hours as needed for dizziness.   Melatonin 10 MG Tabs Take 10 mg by mouth at bedtime.   metFORMIN 500 MG tablet Commonly known as:  GLUCOPHAGE Take 500 mg by mouth 3 (three) times daily with meals.   nystatin cream Commonly known as:   MYCOSTATIN Apply 1 application topically as needed for dry skin.   omeprazole 10 MG capsule Commonly known as:  PRILOSEC Take 10 mg by mouth daily.   PRESERVISION AREDS 2+MULTI VIT Caps Take 1 capsule by mouth daily.   tiZANidine 2 MG tablet Commonly known as:  ZANAFLEX Take 2 mg by mouth 2 (two) times daily. At 8am and 2 pm   tiZANidine 2 MG tablet Commonly known as:  ZANAFLEX Take 4 mg by mouth every evening. Every evening at 8pm  VESICARE 5 MG tablet Generic drug:  solifenacin Take 2.5 mg by mouth at bedtime.   XEOMIN IM Inject into the muscle every 3 (three) months.   zinc oxide 20 % ointment Apply 1 application topically as needed for irritation.       Relevant Imaging Results:  Relevant Lab Results:   Additional Information    Mattthew Ziomek, Juleen ChinaHeather D, LCSW

## 2018-06-29 NOTE — NC FL2 (Deleted)
[place] MEDICAID FL2 LEVEL OF CARE SCREENING TOOL     IDENTIFICATION  Patient Name: Tracy [name] Birthdate: 06/09/1934 Sex: female Admission Date (Current Location): 06/25/2018  [facility] and [place] Number:  [employer] and Address:  [facility],  [address], [place] [address]      Provider Number: [id number]  Attending Physician Name and Address:  Cleora Fleet, MD  Relative Name and Phone Number:       Current Level of Care: Other (Comment)(observation) Recommended Level of Care: Family Care Home Prior Approval Number:    Date Approved/Denied:   PASRR Number:    Discharge Plan: Domiciliary (Rest home)([facility])    Current Diagnoses: Patient Active Problem List   Diagnosis Date Noted  . Transient neurologic deficit 06/25/2018  . Nausea & vomiting 06/25/2018  . Left spastic hemiparesis (HCC) 03/22/2018  . Abnormality of gait 04/28/2016  . Dysphagia, pharyngoesophageal phase 04/28/2016  . 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 RBBB (right bundle branch block with left anterior fascicular block) 08/07/2013  . Essential hypertension, benign 07/30/2013  . Mixed hyperlipidemia 07/30/2013  . Diabetes mellitus (HCC) 06/23/2013  . Focal dystonia 04/02/2013  . Dysarthria-clumsy hand syndrome 10/25/2012    Orientation RESPIRATION BLADDER Height & Weight     Self, Place  Normal Incontinent Weight: 125 lb 3.5 oz (56.8 kg) Height:  5\' 5"  (165.1 cm)  BEHAVIORAL SYMPTOMS/MOOD NEUROLOGICAL BOWEL NUTRITION STATUS      Incontinent (Dysphagia 1 (Puree);Nectar-thick liquid )  AMBULATORY STATUS COMMUNICATION OF NEEDS Skin   Extensive Assist Verbally Normal                       Personal Care Assistance Level of Assistance  Bathing, Feeding, Dressing Bathing Assistance: Maximum assistance Feeding assistance: Independent Dressing Assistance: Maximum assistance      Functional Limitations Info             SPECIAL CARE FACTORS FREQUENCY  PT (By licensed PT), Speech therapy     PT Frequency: 3x/week OT Frequency: no follow up recommended from OT evaluation      Speech Therapy Frequency: 3x/week      Contractures Contractures Info: Not present    Additional Factors Info  Code Status, Allergies, Psychotropic Code Status Info: Full Code Allergies Info: Mold Extract, Other (NOvacaine), Ace Inhibitors, Codeine, Darvon, Novocain, Xylocaine Jelly  Psychotropic Info: Valium         Current Medications (06/29/2018):  This is the current hospital active medication list Current Facility-Administered Medications  Medication Dose Route Frequency Provider Last Rate Last Dose  . acetaminophen (TYLENOL) tablet 650 mg  650 mg Oral Q4H PRN Opyd, Lavone Neri, MD   650 mg at 06/28/18 0134   Or  . acetaminophen (TYLENOL) solution 650 mg  650 mg Per Tube Q4H PRN Opyd, Lavone Neri, MD       Or  . acetaminophen (TYLENOL) suppository 650 mg  650 mg Rectal Q4H PRN Opyd, Lavone Neri, MD      . aspirin suppository 300 mg  300 mg Rectal Daily Opyd, Lavone Neri, MD   300 mg at 06/26/18 1114   Or  . aspirin tablet 325 mg  325 mg Oral Daily Opyd, Lavone Neri, MD   325 mg at 06/28/18 0952  . Carbidopa-Levodopa ER (SINEMET CR) 25-100 MG tablet controlled release 1 tablet  1 tablet Oral TID Opyd, Lavone Neri, MD   1 tablet at  06/29/18 0915  . darifenacin (ENABLEX) 24 hr tablet 7.5 mg  7.5 mg Oral QHS Opyd, Lavone Neri, MD   7.5 mg at 06/28/18 2121  . diazepam (VALIUM) tablet 1 mg  1 mg Oral Daily Opyd, Lavone Neri, MD   1 mg at 06/29/18 0915  . enoxaparin (LOVENOX) injection 40 mg  40 mg Subcutaneous Q24H Opyd, Lavone Neri, MD   40 mg at 06/29/18 0916  . insulin aspart (novoLOG) injection 0-9 Units  0-9 Units Subcutaneous Q4H Opyd, Lavone Neri, MD   3 Units at 06/28/18 2032  . meclizine (ANTIVERT) tablet 25 mg  25 mg Oral BID Haydee Monica, MD   25 mg at 06/29/18 0915  . ondansetron  (ZOFRAN) injection 4 mg  4 mg Intravenous Q6H PRN Opyd, Lavone Neri, MD   4 mg at 06/26/18 0947  . polyvinyl alcohol (LIQUIFILM TEARS) 1.4 % ophthalmic solution 1 drop  1 drop Both Eyes QID PRN Opyd, Lavone Neri, MD      . promethazine (PHENERGAN) injection 12.5 mg  12.5 mg Intravenous Q6H PRN Opyd, Lavone Neri, MD      . senna-docusate (Senokot-S) tablet 1 tablet  1 tablet Oral QHS PRN Opyd, Lavone Neri, MD         Discharge Medications: TAKE these medications   ACCU-CHEK AVIVA PLUS test strip Generic drug:  glucose blood 1 each as needed.   acetaminophen 500 MG tablet Commonly known as:  TYLENOL Take 500 mg by mouth every 6 (six) hours as needed.   amLODipine 5 MG tablet Commonly known as:  NORVASC Take 5 mg by mouth daily.   ARTIFICIAL TEARS OP Apply 1 drop to eye 4 (four) times daily as needed.   aspirin EC 81 MG tablet Take 1 tablet (81 mg total) by mouth daily.   calcium carbonate 500 MG chewable tablet Commonly known as:  TUMS - dosed in mg elemental calcium Chew 2 tablets by mouth 2 (two) times daily.   Carbidopa-Levodopa ER 25-100 MG tablet controlled release Commonly known as:  SINEMET CR TK ONE T PO TID   cetirizine 10 MG tablet Commonly known as:  ZYRTEC Take 5 mg by mouth daily.   diazepam 2 MG tablet Commonly known as:  VALIUM Take 0.5 tablets (1 mg total) by mouth every 8 (eight) hours as needed for anxiety or sedation. What changed:    how much to take  when to take this  reasons to take this   losartan 50 MG tablet Commonly known as:  COZAAR TK 1 T PO QD   meclizine 12.5 MG tablet Commonly known as:  ANTIVERT Take 12.5 mg by mouth every 6 (six) hours as needed for dizziness.   nystatin cream Commonly known as:  MYCOSTATIN Apply 1 application topically as needed for dry skin.   VESICARE 5 MG tablet Generic drug:  solifenacin Take 2.5 mg by mouth at bedtime.   XEOMIN IM Inject into the muscle every 3 (three) months.   zinc  oxide 20 % ointment Apply 1 application topically as needed for irritation.      Relevant Imaging Results:  Relevant Lab Results:   Additional Information    Gotti Alwin, Juleen China, LCSW

## 2018-06-29 NOTE — Discharge Instructions (Signed)
Please make sure to follow up with Neurologist in 1-2 weeks for a hospital follow up visit and recheck.   Please follow up with primary care provider in 1 week.   Seek medical care or return to ER if symptoms come back, worsen or new problem develops.  Please start taking a daily aspirin for stroke prevention.     Follow with Primary MD  Benita Stabile, MD  and other consultant's as instructed your Hospitalist MD  Please get a complete blood count and chemistry panel checked by your Primary MD at your next visit, and again as instructed by your Primary MD.  Get Medicines reviewed and adjusted: Please take all your medications with you for your next visit with your Primary MD  Laboratory/radiological data: Please request your Primary MD to go over all hospital tests and procedure/radiological results at the follow up, please ask your Primary MD to get all Hospital records sent to his/her office.  In some cases, they will be blood work, cultures and biopsy results pending at the time of your discharge. Please request that your primary care M.D. follows up on these results.  Also Note the following: If you experience worsening of your admission symptoms, develop shortness of breath, life threatening emergency, suicidal or homicidal thoughts you must seek medical attention immediately by calling 911 or calling your MD immediately  if symptoms less severe.  You must read complete instructions/literature along with all the possible adverse reactions/side effects for all the Medicines you take and that have been prescribed to you. Take any new Medicines after you have completely understood and accpet all the possible adverse reactions/side effects.   Do not drive when taking Pain medications or sleeping medications (Benzodaizepines)  Do not take more than prescribed Pain, Sleep and Anxiety Medications. It is not advisable to combine anxiety,sleep and pain medications without talking with your primary  care practitioner  Special Instructions: If you have smoked or chewed Tobacco  in the last 2 yrs please stop smoking, stop any regular Alcohol  and or any Recreational drug use.  Wear Seat belts while driving.  Please note: You were cared for by a hospitalist during your hospital stay. Once you are discharged, your primary care physician will handle any further medical issues. Please note that NO REFILLS for any discharge medications will be authorized once you are discharged, as it is imperative that you return to your primary care physician (or establish a relationship with a primary care physician if you do not have one) for your post hospital discharge needs so that they can reassess your need for medications and monitor your lab values.

## 2018-07-05 ENCOUNTER — Telehealth: Payer: Self-pay | Admitting: Neurology

## 2018-07-05 NOTE — Telephone Encounter (Signed)
I called [employer] and spoke with Cat D. Who stated the patient was active and no pre cert was required for either code. [id number]

## 2018-07-10 ENCOUNTER — Telehealth: Payer: Self-pay | Admitting: Neurology

## 2018-07-10 NOTE — Telephone Encounter (Signed)
Verlon Au from [facility] called regarding the apt for the patient on Wednesday. She thinks one of the patients family members accidentally canceled via automated system. Would like to know about getting the patient back on the schedule Tuesday or Thursday morning. Please call and advise. [phone].

## 2018-07-10 NOTE — Telephone Encounter (Signed)
I returned the call to [facility].  The patient has been scheduled for 07/12/2018 at 2pm.  She is aware the patient will need to arrive at 1:30pm for check-in.

## 2018-07-12 ENCOUNTER — Encounter: Payer: Self-pay | Admitting: Neurology

## 2018-07-12 ENCOUNTER — Institutional Professional Consult (permissible substitution): Payer: Medicare Other | Admitting: Neurology

## 2018-07-12 ENCOUNTER — Ambulatory Visit (INDEPENDENT_AMBULATORY_CARE_PROVIDER_SITE_OTHER): Payer: Medicare Other | Admitting: Neurology

## 2018-07-12 ENCOUNTER — Other Ambulatory Visit: Payer: Self-pay

## 2018-07-12 VITALS — BP 136/88 | HR 109 | Resp 20 | Ht 65.0 in | Wt 135.0 lb

## 2018-07-12 DIAGNOSIS — R6889 Other general symptoms and signs: Secondary | ICD-10-CM | POA: Diagnosis not present

## 2018-07-12 DIAGNOSIS — G239 Degenerative disease of basal ganglia, unspecified: Secondary | ICD-10-CM

## 2018-07-12 MED ORDER — LAMOTRIGINE 25 MG PO TABS: ORAL_TABLET | ORAL | 0 refills | Status: DC

## 2018-07-12 MED ORDER — LAMOTRIGINE 100 MG PO TABS: 100.0000 mg | ORAL_TABLET | Freq: Two times a day (BID) | ORAL | 4 refills | Status: DC

## 2018-07-12 NOTE — Progress Notes (Signed)
PATIENT: Tracy [name] DOB: 01/13/1934  Chief Complaint  Patient presents with  . Transient Ischemic Attack    New room.  Here with caregiver from [facility] in [place] for hospital f/u following TIA in December./fim  . Hospitalization Follow-up     HISTORICAL  Tracy [name] is a 85 year old female, seen for hospital follow-up on July 12, 2018. She lives at private care facility, she has lived there since Jun 27 2017.  I have reviewed and summarized the referring note from the referring physician.  She had a past medical history of hypertension, diabetes, was diagnosed with cortical basal ganglion degeneration since 2015, presented with difficulty moving her left hand fingers while playing piano, symptoms progressively getting worse over the past few years, no antigravity movement of left upper extremity now, spastic left shoulder, elbow pain, tendency to have finger flexion, she can take a few steps with assistance and walker.   She also develope speech difficulty, denies swallowing difficulty, denies significant memory loss.  She spent most of the day sitting in her special chair, working on her iPad, send the email, and playing games,   I saw her once on April 12, 2018 for EMG guided botulism toxin injection for spastic left upper extremity, she was injected since January 2015, initially by Dr. Hosie Poisson, later transferred her care to [facility] Dr. Westley Hummer, last injection was on December 23, 2017, she received 300 units to her left upper extremity  She is transferring her injection for convenient reasons, she now complains of pain of left shoulder, elbows passive movement, no longer have significant left finger contraction.  She presented with two episodes of sudden onset unresponsiveness on June 25, 2018, initial episode was at lunchtime around 1 PM, she just finished lunch, suddenly slumped over, staring, not responsive for 5 to 10 minutes, then snapped out of it  The  second episode was the same day on June 25, 2018, 6 PM, after supper, her right arm fell off, could not open her mouth, staring, not responsive, lasting for 5 minutes, paramedic was called, was admitted to the hospital, she has no recollection of the event   I have reviewed and summarized the hospital discharge on June 29, 2018,   I personally reviewed CT head, MRI of the brain on June 27, 2019, generalized atrophy, supratentorium small vessel disease, there was no acute abnormality.  Laboratory evaluations on June 26, 2018 showed A1c 5.3, LDL 74, cholesterol 127, negative troponin, UDS was positive for benzodiazepine  Ultrasound of carotid artery showed minimum to moderate bilateral atherosclerotic plaque, left greater than right,  EEG was normal on Jun 27 2018. ECHO, EF 65-70%, normal cavity size, normal wall motion.  REVIEW OF SYSTEMS: Full 14 system review of systems performed and notable only for as above All other review of systems were negative.  ALLERGIES: Allergies  Allergen Reactions  . Mold Extract [Trichophyton] Anaphylaxis    sneezing  . Other Other (See Comments) and Anaphylaxis    Headaches, breathing issues NOVACAINE - shock Headaches, breathing issues  . Ace Inhibitors Cough  . Codeine Nausea And Vomiting  . Darvon [Propoxyphene] Nausea And Vomiting and Other (See Comments)  . Novocain [Procaine Hcl] Swelling    BP drop  . Xylocaine Jelly [Lidocaine Hcl]     unknown    HOME MEDICATIONS: Current Outpatient Medications  Medication Sig Dispense Refill  . ACCU-CHEK AVIVA PLUS test strip 1 each as needed.   3  . acetaminophen (TYLENOL)  500 MG tablet Take 500 mg by mouth every 6 (six) hours as needed.    Marland Kitchen. amLODipine (NORVASC) 5 MG tablet Take 5 mg by mouth daily.    Marland Kitchen. aspirin EC 81 MG tablet Take 81 mg by mouth daily.    . calcium carbonate (TUMS - DOSED IN MG ELEMENTAL CALCIUM) 500 MG chewable tablet Chew 2 tablets by mouth 2 (two) times daily.       . Carbidopa-Levodopa ER (SINEMET CR) 25-100 MG tablet controlled release TK ONE T PO TID  8  . cetirizine (ZYRTEC) 10 MG tablet Take 5 mg by mouth daily.    . diazepam (VALIUM) 2 MG tablet Take 0.5 tablets (1 mg total) by mouth every 8 (eight) hours as needed for anxiety or sedation. 10 tablet 0  . food thickener (THICK-IT #2) POWD Use as directed to thicken foods and liquids as recommended by speech therapist. 1 Can 0  . Hypromellose (ARTIFICIAL TEARS OP) Apply 1 drop to eye 4 (four) times daily as needed.    . IncobotulinumtoxinA (XEOMIN IM) Inject into the muscle every 3 (three) months.    Marland Kitchen. losartan (COZAAR) 50 MG tablet TK 1 T PO QD  3  . meclizine (ANTIVERT) 12.5 MG tablet Take 12.5 mg by mouth every 6 (six) hours as needed for dizziness.    . Melatonin 10 MG TABS Take 10 mg by mouth at bedtime.    . metFORMIN (GLUCOPHAGE) 500 MG tablet Take 500 mg by mouth 3 (three) times daily with meals.    . Multiple Vitamins-Minerals (PRESERVISION AREDS 2+MULTI VIT) CAPS Take 1 capsule by mouth daily.    Marland Kitchen. nystatin cream (MYCOSTATIN) Apply 1 application topically as needed for dry skin.    Marland Kitchen. omeprazole (PRILOSEC) 10 MG capsule Take 10 mg by mouth daily.    Marland Kitchen. tiZANidine (ZANAFLEX) 2 MG tablet Take 2 mg by mouth 2 (two) times daily. At 8am and 2 pm    . tiZANidine (ZANAFLEX) 2 MG tablet Take 4 mg by mouth every evening. Every evening at 8pm    . VESICARE 5 MG tablet Take 2.5 mg by mouth at bedtime.  2  . zinc oxide 20 % ointment Apply 1 application topically as needed for irritation.     Current Facility-Administered Medications  Medication Dose Route Frequency Provider Last Rate Last Dose  . incobotulinumtoxinA (XEOMIN) 100 units injection 300 Units  300 Units Intramuscular Q90 days Levert FeinsteinYan, Reinette Cuneo, MD   300 Units at 04/14/18 1542    PAST MEDICAL HISTORY: Past Medical History:  Diagnosis Date  . Carpal tunnel syndrome   . 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 Diabetes mellitus   .  Fibrocystic breast disease   . Fibromyalgia   . Focal dystonia   . GERD (gastroesophageal reflux disease)   . Hypertension   . Leukopenia   . Mild cognitive impairment   . RBBB   . Vitamin B12 deficiency     PAST SURGICAL HISTORY: Past Surgical History:  Procedure Laterality Date  . APPENDECTOMY    . CARDIAC CATHETERIZATION     normal  . TONSILLECTOMY      FAMILY HISTORY: Family History  Problem Relation Age of Onset  . CAD Father   . Stroke Other   . Liver cancer Other     SOCIAL HISTORY: Social History   Socioeconomic History  . Marital status: Married    Spouse name: Not on file  . Number of children: 2  . Years of education: masters  .  Highest education level: Not on file  Occupational History  . Occupation: retired  Engineer, production  . Financial resource strain: Not on file  . Food insecurity:    Worry: Not on file    Inability: Not on file  . Transportation needs:    Medical: Not on file    Non-medical: Not on file  Tobacco Use  . Smoking status: Never Smoker  . Smokeless tobacco: Never Used  Substance and Sexual Activity  . Alcohol use: No  . Drug use: No  . Sexual activity: Not on file  Lifestyle  . Physical activity:    Days per week: Not on file    Minutes per session: Not on file  . Stress: Not on file  Relationships  . Social connections:    Talks on phone: Not on file    Gets together: Not on file    Attends religious service: Not on file    Active member of club or organization: Not on file    Attends meetings of clubs or organizations: Not on file    Relationship status: Not on file  . Intimate partner violence:    Fear of current or ex partner: Not on file    Emotionally abused: Not on file    Physically abused: Not on file    Forced sexual activity: Not on file  Other Topics Concern  . Not on file  Social History Narrative   Patient lives at [facility]   Address: [address] [place], [place], [place] [address]    Phone: [phone]   Patient is widowed, has 2 children   Patient is right handed   Education level is Master's degree   Caffeine consumption is 2 cups daily     PHYSICAL EXAM   Vitals:   07/12/18 1359  BP: 136/88  Pulse: (!) 109  Resp: 20  Weight: 135 lb (61.2 kg)  Height: 5\' 5"  (1.651 m)    Not recorded      Body mass index is 22.47 kg/m.  PHYSICAL EXAMNIATION:  Gen: NAD, conversant, well nourised, obese, well groomed                     Cardiovascular: Regular rate rhythm, no peripheral edema, warm, nontender. Eyes: Conjunctivae clear without exudates or hemorrhage Neck: Supple, no carotid bruits. Pulmonary: Clear to auscultation bilaterally   NEUROLOGICAL EXAM:  MENTAL STATUS: Speech: Slurred soft speech Cognition:     Orientation to time, place and person     Normal recent and remote memory     Normal Attention span and concentration     Normal Language, naming, repeating,spontaneous speech     Fund of knowledge   CRANIAL NERVES: CN II: Visual fields are full to confrontation.  Pupils are round equal and briskly reactive to light. CN III, IV, VI: extraocular movement are normal. No ptosis. CN V: Facial sensation is intact to pinprick in all 3 divisions bilaterally. Corneal responses are intact.  CN VII: Face is symmetric with normal eye closure and smile. CN IX, X: Palate elevates symmetrically. Phonation is normal. CN XI: Head turning and shoulder shrug are intact CN XII: Tongue is midline with normal movements and no atrophy.  MOTOR: She has difficulty raising left arm against gravity, move right arm, bilateral lower extremity by commands  REFLEXES: Reflexes are 2+ and symmetric at the biceps, triceps, knees, and ankles. Plantar responses are flexor.  SENSORY: Intact to light touch  COORDINATION: Rapid alternating  movements and fine finger movements are intact. There is no dysmetria on finger-to-nose and heel-knee-shin.     GAIT/STANCE: Deferred DIAGNOSTIC DATA (LABS, IMAGING, TESTING) - I reviewed patient records, labs, notes, testing and imaging myself where available.   ASSESSMENT AND PLAN  Tracy [name] is a 85 y.o. female   Cortical basal ganglion degeneration  2 episodes of sudden onset difficulty talking, staring spells,  Differentiation diagnosis includes partial seizure, TIA,  Empirically treat with lamotrigine titrating to 100 mg twice a day, document all events   Levert Feinstein, M.D. Ph.D.  [facility] [address] [place], [place] [address] Ph: [phone] Fax: [phone]  CC: Referring Provider

## 2018-10-10 ENCOUNTER — Telehealth: Payer: Self-pay | Admitting: Neurology

## 2018-10-10 NOTE — Telephone Encounter (Signed)
I called the patient and left a message.  She has an appointment at our office tomorrow April 15.  I advised her we are not seeing patients in the office, calling to set up a virtual visit or telephone visit.  I asked her to please call our office back to arrange.

## 2018-10-11 ENCOUNTER — Ambulatory Visit (INDEPENDENT_AMBULATORY_CARE_PROVIDER_SITE_OTHER): Payer: Medicare Other | Admitting: Neurology

## 2018-10-11 ENCOUNTER — Other Ambulatory Visit: Payer: Self-pay

## 2018-10-11 ENCOUNTER — Encounter: Payer: Self-pay | Admitting: Neurology

## 2018-10-11 DIAGNOSIS — R6889 Other general symptoms and signs: Secondary | ICD-10-CM | POA: Diagnosis not present

## 2018-10-11 DIAGNOSIS — G239 Degenerative disease of basal ganglia, unspecified: Secondary | ICD-10-CM

## 2018-10-11 NOTE — Progress Notes (Signed)
Virtual Visit via Video Note  I connected with Tracy [name] on 10/11/18 at  2:15 PM EDT by a video enabled telemedicine application and verified that I am speaking with the correct person using two identifiers.   I discussed the limitations of evaluation and management by telemedicine and the availability of in person appointments. The patient expressed understanding and agreed to proceed.  History of Present Illness: 07/12/2018 Dr. Terrace Arabia: Tracy [name] is a 85 year old female, seen for hospital follow-up on July 12, 2018. She lives at private care facility, she has lived there since Jun 27 2017.  I have reviewed and summarized the referring note from the referring physician.  She had a past medical history of hypertension, diabetes, was diagnosed with cortical basal ganglion degeneration since 2015, presented with difficulty moving her left hand fingers while playing piano, symptoms progressively getting worse over the past few years, no antigravity movement of left upper extremity now, spastic left shoulder, elbow pain, tendency to have finger flexion, she can take a few steps with assistance and walker.   She also develope speech difficulty, denies swallowing difficulty, denies significant memory loss.  She spent most of the day sitting in her special chair, working on her iPad, send the email, and playing games,   I saw her once on April 12, 2018 for EMG guided botulism toxin injection for spastic left upper extremity, she was injected since January 2015, initially by Dr. Hosie Poisson, later transferred her care to [facility] Dr. Westley Hummer, last injection was on December 23, 2017, she received 300 units to her left upper extremity  She is transferring her injection for convenient reasons, she now complains of pain of left shoulder, elbows passive movement, no longer have significant left finger contraction.  She presented with two episodes of sudden onset unresponsiveness on June 25, 2018, initial  episode was at lunchtime around 1 PM, she just finished lunch, suddenly slumped over, staring, not responsive for 5 to 10 minutes, then snapped out of it  The second episode was the same day on June 25, 2018, 6 PM, after supper, her right arm fell off, could not open her mouth, staring, not responsive, lasting for 5 minutes, paramedic was called, was admitted to the hospital, she has no recollection of the event   I have reviewed and summarized the hospital discharge on June 29, 2018,   I personally reviewed CT head, MRI of the brain on June 27, 2019, generalized atrophy, supratentorium small vessel disease, there was no acute abnormality.  Laboratory evaluations on June 26, 2018 showed A1c 5.3, LDL 74, cholesterol 127, negative troponin, UDS was positive for benzodiazepine  Ultrasound of carotid artery showed minimum to moderate bilateral atherosclerotic plaque, left greater than right,  EEG was normal on Jun 27 2018. ECHO, EF 65-70%, normal cavity size, normal wall motion.  October 11 2018 SS: Virtual visit with Tracy [name], caregiver, in family care home in [place], [place] for 1 year. Follow-up for 2 episodes of sudden difficulty talking, staring spells both on June 25 2018, differentiation includes seizure, TIA, she was started on lamotrigine titrate to 100 mg twice daily.  She is tolerating medication well.  She has not had any further episodes or spells.  She does not walk, is in a wheelchair.  She has not had any falls.  She has difficulty swallowing, she has to thicken her liquids, pure her foods. No changes in medical history. No new problems or concerns.   Observations/Objective: Alert, speech somewhat difficult to  understand, unable to move the left arm, limited range of motion of her head and neck, range of motion intact to right arm, no right arm drift  Assessment and Plan: 1. Cortical basal ganglion degeneration   She will continue taking Lamictal 100 mg  twice daily.  She has not had any further events of difficulty talking or staring spells.  She is currently living at family care home, her caregiver will inquire about getting lab work done at the facility.  I will place orders for a lamotrigine level, CBC, CMP. Caregiver reports that she will check with nurse to see if they can be drawn at facility, she will call and let us know.   Follow Up Instructions: 4-6 months for revisit    I discussed the assessment and treatment plan with the patient. The patient was provided an opportunity to ask questions and all were answered. The patient agreed with the plan and demonstrated an understanding of the instructions.   The patient was advised to call back or seek an in-person evaluation if the symptoms worsen or if the condition fails to improve as anticipated.  I provided 20 minutes of non-face-to-face time during this encounter.   Otila KluverSarah Slack, AGNP-C, DNP  [facility] [address], [address] [place], [place] [address] [phone]

## 2018-10-11 NOTE — Telephone Encounter (Signed)
Spoke with Hedda Slade, RN at the nursing home and the patient has given verbal consent to doing a webex meeting with Margie Ege, NP and to file her insurance. email has been verified.

## 2018-10-11 NOTE — Progress Notes (Signed)
I have reviewed and agreed above plan. 

## 2018-10-16 NOTE — Addendum Note (Signed)
Addended by: Tamera Stands D on: 10/16/2018 08:48 AM   Modules accepted: Orders

## 2018-10-24 ENCOUNTER — Telehealth: Payer: Self-pay

## 2018-10-24 NOTE — Telephone Encounter (Signed)
Per Margie Ege, NP patient resides in [facility] in [place], [place] and she would like the patient to have labs drawn in the nursing home. Lab orders have faxed to the nursing home at [phone]. Tel: [phone]. Confirmation fax has been received.

## 2018-11-09 ENCOUNTER — Telehealth: Payer: Self-pay | Admitting: *Deleted

## 2018-11-09 NOTE — Telephone Encounter (Signed)
LMVM for pt to return call to schedule 4-6 mo OV with ss/NP.  When returns call ok to make appt.

## 2019-03-23 ENCOUNTER — Other Ambulatory Visit: Payer: Self-pay | Admitting: Neurology

## 2019-04-02 ENCOUNTER — Other Ambulatory Visit: Payer: Self-pay | Admitting: Neurology

## 2019-07-06 DIAGNOSIS — R05 Cough: Secondary | ICD-10-CM | POA: Diagnosis not present

## 2019-07-06 DIAGNOSIS — J069 Acute upper respiratory infection, unspecified: Secondary | ICD-10-CM | POA: Diagnosis not present

## 2019-08-07 DIAGNOSIS — N390 Urinary tract infection, site not specified: Secondary | ICD-10-CM | POA: Diagnosis not present

## 2020-01-21 DIAGNOSIS — R300 Dysuria: Secondary | ICD-10-CM | POA: Diagnosis not present

## 2020-02-26 ENCOUNTER — Other Ambulatory Visit: Payer: Self-pay | Admitting: Neurology

## 2020-05-12 ENCOUNTER — Telehealth: Payer: Self-pay | Admitting: Neurology

## 2020-05-12 ENCOUNTER — Ambulatory Visit: Payer: Self-pay | Admitting: Neurology

## 2020-05-12 DIAGNOSIS — R519 Headache, unspecified: Secondary | ICD-10-CM | POA: Diagnosis not present

## 2020-05-12 DIAGNOSIS — M5481 Occipital neuralgia: Secondary | ICD-10-CM | POA: Diagnosis not present

## 2020-05-12 NOTE — Telephone Encounter (Signed)
Appointment held 05-15-20 1045.

## 2020-05-12 NOTE — Telephone Encounter (Signed)
[facility]) called, has a sharp pain through eyes and ears. Pt want a MRI of her head. Would like to discuss getting her a early appt.

## 2020-05-12 NOTE — Telephone Encounter (Signed)
I called Verlon Au with [facility], offered appt with Dr Terrace Arabia, [phone] 05-14-20 (not sure if too early as pt older mobility issues).  I placed appt time and will see if will be able to come.  Other wise I held appt on SS/NP scheduled 05-16-19 at 1245. Hopefully one of these will work.

## 2020-05-12 NOTE — Telephone Encounter (Signed)
I called Tracy [name] with [facility], appt cancelled today.  ? Why.  I see that this may have been because of new problem.  I am not sure.  This was made 02-26-20 initally by Marcelino Duster, RN .  Please advise.  Referral by Dr. Margo Aye in [place]?  He does not order MRI??  Let me know.

## 2020-05-12 NOTE — Telephone Encounter (Signed)
I think this was a scheduling error, if new problem, anything open with Dr. Terrace Arabia this week? Otherwise do I have anything available this week? Dr. Terrace Arabia can consult if needed? Appointment was made 8/31 for new problem?  Last seen in April 2020, we follow for episodes of difficulty talking, staring, possible partial seizure?  On Lamictal, no further spells when last seen in April 2020.

## 2020-05-12 NOTE — Telephone Encounter (Signed)
Last visit was on October 11 2018, virtual with Maralyn Sago. Now she complains of   It is Ok to put her on my schedule  Now she complains of sharp pain through her eyes and ears.

## 2020-05-13 NOTE — Telephone Encounter (Signed)
I called Donneta Romberg [facility].   She stated that pcp was able to see pt and start her on prednisone and order MRI for pt. I made NX with Dr. Terrace Arabia 07-03-2020 [place] at 1300 occipital neuralgia?  (pt was cancelled day before 05-12-20 appt, pt had been waiting 2 months for this appt.  Was a scheduling issue of slot.  Attempted to make appt this week, but due to pt mobility, getting ready for early appt, decided to see pcp, first and then get in with [place] later.

## 2020-05-14 ENCOUNTER — Ambulatory Visit: Payer: Self-pay | Admitting: Neurology

## 2020-05-14 ENCOUNTER — Other Ambulatory Visit (HOSPITAL_COMMUNITY): Payer: Self-pay | Admitting: Internal Medicine

## 2020-05-14 DIAGNOSIS — M5481 Occipital neuralgia: Secondary | ICD-10-CM

## 2020-05-14 DIAGNOSIS — R519 Headache, unspecified: Secondary | ICD-10-CM

## 2020-05-29 ENCOUNTER — Other Ambulatory Visit: Payer: Self-pay

## 2020-05-29 ENCOUNTER — Ambulatory Visit (HOSPITAL_COMMUNITY)
Admission: RE | Admit: 2020-05-29 | Discharge: 2020-05-29 | Disposition: A | Discharge status: Home / Self Care | Payer: Medicare PPO | Source: Ambulatory Visit | Attending: Internal Medicine | Admitting: Internal Medicine

## 2020-05-29 DIAGNOSIS — I6782 Cerebral ischemia: Secondary | ICD-10-CM | POA: Diagnosis not present

## 2020-05-29 DIAGNOSIS — R519 Headache, unspecified: Secondary | ICD-10-CM | POA: Diagnosis not present

## 2020-05-29 DIAGNOSIS — M5481 Occipital neuralgia: Secondary | ICD-10-CM | POA: Diagnosis not present

## 2020-05-29 DIAGNOSIS — R410 Disorientation, unspecified: Secondary | ICD-10-CM | POA: Diagnosis not present

## 2020-05-29 DIAGNOSIS — J0120 Acute ethmoidal sinusitis, unspecified: Secondary | ICD-10-CM | POA: Diagnosis not present

## 2020-05-29 DIAGNOSIS — G319 Degenerative disease of nervous system, unspecified: Secondary | ICD-10-CM | POA: Diagnosis not present

## 2020-05-29 MED ORDER — GADOBUTROL 1 MMOL/ML IV SOLN
5.0000 mL | Freq: Once | INTRAVENOUS | Status: AC | PRN
  Administered 2020-05-29: 5 mL via INTRAVENOUS

## 2020-07-01 DIAGNOSIS — R339 Retention of urine, unspecified: Secondary | ICD-10-CM | POA: Diagnosis not present

## 2020-07-01 DIAGNOSIS — Z0001 Encounter for general adult medical examination with abnormal findings: Secondary | ICD-10-CM | POA: Diagnosis not present

## 2020-07-01 DIAGNOSIS — N390 Urinary tract infection, site not specified: Secondary | ICD-10-CM | POA: Diagnosis not present

## 2020-07-01 DIAGNOSIS — R32 Unspecified urinary incontinence: Secondary | ICD-10-CM | POA: Diagnosis not present

## 2020-07-01 DIAGNOSIS — R1310 Dysphagia, unspecified: Secondary | ICD-10-CM | POA: Diagnosis not present

## 2020-07-01 DIAGNOSIS — G459 Transient cerebral ischemic attack, unspecified: Secondary | ICD-10-CM | POA: Diagnosis not present

## 2020-07-01 DIAGNOSIS — J309 Allergic rhinitis, unspecified: Secondary | ICD-10-CM | POA: Diagnosis not present

## 2020-07-01 DIAGNOSIS — R252 Cramp and spasm: Secondary | ICD-10-CM | POA: Diagnosis not present

## 2020-07-03 ENCOUNTER — Institutional Professional Consult (permissible substitution): Payer: Self-pay | Admitting: Neurology

## 2020-07-17 ENCOUNTER — Encounter: Payer: Self-pay | Admitting: Neurology

## 2020-07-17 ENCOUNTER — Ambulatory Visit (INDEPENDENT_AMBULATORY_CARE_PROVIDER_SITE_OTHER): Payer: Medicare PPO | Admitting: Neurology

## 2020-07-17 VITALS — BP 163/86 | HR 82

## 2020-07-17 DIAGNOSIS — G239 Degenerative disease of basal ganglia, unspecified: Secondary | ICD-10-CM

## 2020-07-17 DIAGNOSIS — G3185 Corticobasal degeneration: Secondary | ICD-10-CM | POA: Diagnosis not present

## 2020-07-17 DIAGNOSIS — G8114 Spastic hemiplegia affecting left nondominant side: Secondary | ICD-10-CM | POA: Diagnosis not present

## 2020-07-17 NOTE — Progress Notes (Signed)
Chief Complaint  Patient presents with  . Consult    Referred for evaluation of occipital neuralgia. Recent MRI brain on 06/18/20. [facility] resides at [facility] and is here today with one of her caregivers Verlon Au(Leslie).     HISTORICAL Tracy Clampancy P Ghentis a 87 year old female,seenfor hospital follow-up on July 12, 2018. She lives at private care facility, she has lived there since Jun 27 2017.  I have reviewed and summarized the referring note from the referring physician. She had a past medical history of hypertension, diabetes, was diagnosed with cortical basal ganglion degeneration since 2015, presented with difficulty moving her left hand fingers while playing piano, symptoms progressively getting worse over the past few years, no antigravity movement of left upper extremity now, spastic left shoulder, elbow pain, tendency to have finger flexion, she can take a few steps with assistance and walker. She also develope speech difficulty, denies swallowing difficulty, denies significant memory loss. She spent most of the day sitting in her special chair, working on her iPad, send the email, and playing games,   I saw her once on April 12, 2018 forEMG guided botulism toxin injectionfor spastic left upper extremity, she was injectedsince January 2015, initially by Dr. Hosie PoissonSumner, later [facility] Westley HummerSiddiqi, last injection was on December 23, 2017, she received 300 units to her left upper extremity  She is transferring her injection for convenient reasons, she now complains of pain of left shoulder, elbows passive movement, no longer have significant left finger contraction.  She presented with two episodes ofsudden onset unresponsiveness on June 25, 2018, initial episode was at lunchtime around 1 PM, she just finished lunch, suddenly slumped over, staring, not responsive for 5 to 10 minutes, then snapped out of it  The second episode was the same day on  June 25, 2018, 6 PM, after supper, her right arm fell off, could not open her mouth, staring, not responsive, lasting for 5 minutes, paramedic was called, was admitted to the hospital, she has no recollection of the event  CT head, MRI of the brain on June 27, 2019,generalized atrophy, supratentorium small vessel disease, there was no acute abnormality.  Laboratory evaluations on June 26, 2018 showed A1c 5.3, LDL 74, cholesterol 127, negative troponin, UDS was positive for benzodiazepine  Ultrasound of carotid artery showed minimum to moderate bilateral atherosclerotic plaque, left greater than right,  EEG was normal on Jun 27 2018. ECHO, EF 65-70%, normal cavity size, normal wall motion.  Concerning for possible seizure,- she was started on lamotrigine titrate to 100 mg twice daily.  She is tolerating medication well.  She has not had any further episodes or spells.  She does not walk, is in a wheelchair.  She has not had any falls.  She has difficulty swallowing, she has to thicken her liquids, pure her foods. No changes in medical history. No new problems or concerns.   Update July 17, 2020: She continued to decline slowly, no longer ambulatory, spent most of the time looking at her iPad, playing games, worsening speech difficulty, swallowing difficulty, needing help feeding, and daily activity,  We personally reviewed MRI of the brain with without contrast May 29, 2020, no evidence of acute abnormality, moderate to advanced cerebral atrophy, progressive midbrain atrophy, moderate supratentorium small vessel disease  REVIEW OF SYSTEMS: Full 14 system review of systems performed and notable only for as above All other review of systems were negative.  ALLERGIES: Allergies  Allergen Reactions  .  Mold Extract [Trichophyton] Anaphylaxis    sneezing  . Other Other (See Comments) and Anaphylaxis    Headaches, breathing issues NOVACAINE - shock Headaches, breathing  issues  . Ace Inhibitors Cough  . Codeine Nausea And Vomiting  . Darvon [Propoxyphene] Nausea And Vomiting and Other (See Comments)  . Novocain [Procaine Hcl] Swelling    BP drop  . Xylocaine Jelly [Lidocaine Hcl]     unknown    HOME MEDICATIONS: Current Outpatient Medications  Medication Sig Dispense Refill  . ACCU-CHEK AVIVA PLUS test strip 1 each as needed.   3  . acetaminophen (TYLENOL) 500 MG tablet Take 500 mg by mouth at bedtime.    Marland Kitchen amitriptyline (ELAVIL) 25 MG tablet Take 25 mg by mouth at bedtime.    Marland Kitchen amLODipine (NORVASC) 5 MG tablet Take 5 mg by mouth daily.    Marland Kitchen aspirin EC 81 MG tablet Take 81 mg by mouth daily.    . benzonatate (TESSALON) 100 MG capsule Take 100-200 mg by mouth 3 (three) times daily as needed for cough.    Marland Kitchen CALCIUM PO Take 750 mg by mouth daily.    . Carbidopa-Levodopa ER (SINEMET CR) 25-100 MG tablet controlled release TK ONE T PO TID  8  . cetirizine (ZYRTEC) 10 MG tablet Take 5 mg by mouth daily.    . diazepam (VALIUM) 2 MG tablet Take 1 mg by mouth daily.    . food thickener (THICK-IT #2) POWD Use as directed to thicken foods and liquids as recommended by speech therapist. 1 Can 0  . glucosamine-chondroitin 500-400 MG tablet Take 1 tablet by mouth daily.    Marland Kitchen guaiFENesin (MUCINEX PO) Take 1-2 tablets by mouth 2 (two) times daily as needed (cough).    . Hypromellose (ARTIFICIAL TEARS OP) Apply 1 drop to eye 4 (four) times daily as needed.    . lamoTRIgine (LAMICTAL) 100 MG tablet TAKE 1 TABLET BY MOUTH TWICE DAILY. 60 tablet 2  . loperamide (IMODIUM) 2 MG capsule Take 2 mg by mouth daily as needed for diarrhea or loose stools.    Marland Kitchen losartan (COZAAR) 50 MG tablet TK 1 T PO QD  3  . meclizine (ANTIVERT) 12.5 MG tablet Take 12.5 mg by mouth every 6 (six) hours as needed for dizziness.    . Melatonin 10 MG TABS Take 10 mg by mouth at bedtime.    . metFORMIN (GLUCOPHAGE) 500 MG tablet Take 500 mg by mouth 3 (three) times daily with meals.    . Multiple  Vitamins-Minerals (PRESERVISION AREDS 2+MULTI VIT) CAPS Take 1 capsule by mouth daily.    Marland Kitchen nystatin cream (MYCOSTATIN) Apply 1 application topically as needed for dry skin.    Marland Kitchen omeprazole (PRILOSEC) 10 MG capsule Take 10 mg by mouth daily.    Marland Kitchen tiZANidine (ZANAFLEX) 2 MG tablet Take 2 mg by mouth 2 (two) times daily. At 8am and 2 pm    . tiZANidine (ZANAFLEX) 2 MG tablet Take 4 mg by mouth every evening. Every evening at 8pm    . zinc oxide 20 % ointment Apply 1 application topically as needed for irritation.     No current facility-administered medications for this visit.    PAST MEDICAL HISTORY: Past Medical History:  Diagnosis Date  . Carpal tunnel syndrome   . 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 Diabetes mellitus   . Fibrocystic breast disease   . Fibromyalgia   . Focal dystonia   . GERD (gastroesophageal reflux disease)   .  Hypertension   . Leukopenia   . Mild cognitive impairment   . RBBB   . Vitamin B12 deficiency     PAST SURGICAL HISTORY: Past Surgical History:  Procedure Laterality Date  . APPENDECTOMY    . CARDIAC CATHETERIZATION     normal  . TONSILLECTOMY      FAMILY HISTORY: Family History  Problem Relation Age of Onset  . CAD Father   . Stroke Other   . Liver cancer Other     SOCIAL HISTORY: Social History   Socioeconomic History  . Marital status: Married    Spouse name: Not on file  . Number of children: 2  . Years of education: masters  . Highest education level: Not on file  Occupational History  . Occupation: retired  Tobacco Use  . Smoking status: Never Smoker  . Smokeless tobacco: Never Used  Substance and Sexual Activity  . Alcohol use: No  . Drug use: No  . Sexual activity: Not on file  Other Topics Concern  . Not on file  Social History Narrative   Patient lives at [facility] [phone]).   Patient is widowed, has 2 children   Patient is right handed   Education level is Master's degree    Caffeine consumption is 2 cups daily   Social Determinants of Health   Financial Resource Strain: Not on file  Food Insecurity: Not on file  Transportation Needs: Not on file  Physical Activity: Not on file  Stress: Not on file  Social Connections: Not on file  Intimate Partner Violence: Not on file     PHYSICAL EXAM   Vitals:   07/17/20 1243  BP: (!) 163/86  Pulse: 82   Not recorded     There is no height or weight on file to calculate BMI.  PHYSICAL EXAMNIATION:  Gen: NAD, conversant, well nourised, well groomed                     Cardiovascular: Regular rate rhythm, no peripheral edema, warm, nontender. Eyes: Conjunctivae clear without exudates or hemorrhage Pulmonary: Clear to auscultation bilaterally   NEUROLOGICAL EXAM:  MENTAL STATUS: Speech/cognition: Slow spastic almost unintelligible speech   CRANIAL NERVES: CN II:  Pupils are round equal and briskly reactive to light. CN III, IV, VI: Limited vertical eye movement, especially with upper gaze, smooth pursuit was broken to small catch-up saccade CN V: Facial sensation is intact to light touch CN VII: Face is symmetric with normal eye closure  CN VIII: Hearing is normal to causal conversation. CN IX, X: Phonation is normal. CN XI: Fixed posturing, tension of bilateral upper trapezius muscle, limited range of motion of her neck  MOTOR: Fixed posturing of left arm, with left elbow extension, finger flexion, limited range of motion of right shoulder, antigravity movement of right arm, hand, antigravity movement of bilateral lower extremity, left weaker than right  REFLEXES: Hypoactive symmetric  SENSORY: Intact to light touch, pinprick and vibratory sensation are intact in fingers and toes.  COORDINATION: There is no trunk or limb dysmetria noted.  GAIT/STANCE: Deferred   DIAGNOSTIC DATA (LABS, IMAGING, TESTING) - I reviewed patient records, labs, notes, testing and imaging myself where  available.   ASSESSMENT AND PLAN  Tracy [name] is a 87 y.o. female   Cortical basal ganglion degeneration  Slowly progression,  Neck pain, frequent headaches  Most related to her fixed posturing,  Have suggested warm compression, massage, neck stretching exercise  Evangeline Utley  Terrace Arabia, M.D. Ph.D.  [facility] [address] [place], [place] [address] Ph: [phone] Fax: [phone]  CC:  Benita Stabile, MD [address] [place],  [place] [address]

## 2020-11-07 DIAGNOSIS — J069 Acute upper respiratory infection, unspecified: Secondary | ICD-10-CM | POA: Diagnosis not present

## 2021-09-01 DIAGNOSIS — G238 Other specified degenerative diseases of basal ganglia: Secondary | ICD-10-CM | POA: Diagnosis not present

## 2021-09-01 DIAGNOSIS — Z515 Encounter for palliative care: Secondary | ICD-10-CM | POA: Diagnosis not present

## 2021-09-26 DEATH — deceased
